# Patient Record
Sex: Female | Born: 1965
Health system: Southern US, Community
[De-identification: ages and names within clinical notes are randomized; demographics above are authoritative.]

## PROBLEM LIST (undated history)

## (undated) DIAGNOSIS — N189 Chronic kidney disease, unspecified: Secondary | ICD-10-CM

## (undated) DIAGNOSIS — E669 Obesity, unspecified: Secondary | ICD-10-CM

## (undated) DIAGNOSIS — K219 Gastro-esophageal reflux disease without esophagitis: Secondary | ICD-10-CM

## (undated) HISTORY — PX: CHOLECYSTECTOMY: SHX55

## (undated) HISTORY — PX: TUBAL LIGATION: SHX77

## (undated) HISTORY — DX: Chronic kidney disease, unspecified: N18.9

---

## 2000-06-13 ENCOUNTER — Encounter: Payer: Self-pay | Admitting: *Deleted

## 2000-06-13 ENCOUNTER — Inpatient Hospital Stay (HOSPITAL_COMMUNITY): Admission: AD | Admit: 2000-06-13 | Discharge: 2000-06-13 | Payer: Self-pay | Admitting: *Deleted

## 2000-06-14 ENCOUNTER — Inpatient Hospital Stay (HOSPITAL_COMMUNITY): Admission: AD | Admit: 2000-06-14 | Discharge: 2000-06-16 | Payer: Self-pay | Admitting: *Deleted

## 2000-06-18 ENCOUNTER — Encounter: Admission: RE | Admit: 2000-06-18 | Discharge: 2000-06-18 | Payer: Self-pay | Admitting: Obstetrics & Gynecology

## 2000-07-08 ENCOUNTER — Inpatient Hospital Stay (HOSPITAL_COMMUNITY): Admission: AD | Admit: 2000-07-08 | Discharge: 2000-07-08 | Payer: Self-pay | Admitting: *Deleted

## 2000-07-10 ENCOUNTER — Inpatient Hospital Stay (HOSPITAL_COMMUNITY): Admission: AD | Admit: 2000-07-10 | Discharge: 2000-07-10 | Payer: Self-pay | Admitting: Obstetrics

## 2000-07-10 ENCOUNTER — Encounter: Payer: Self-pay | Admitting: *Deleted

## 2000-07-14 ENCOUNTER — Encounter (HOSPITAL_COMMUNITY): Admission: RE | Admit: 2000-07-14 | Discharge: 2000-08-11 | Payer: Self-pay | Admitting: Obstetrics & Gynecology

## 2000-07-21 ENCOUNTER — Encounter: Payer: Self-pay | Admitting: Obstetrics

## 2000-08-08 ENCOUNTER — Encounter: Payer: Self-pay | Admitting: *Deleted

## 2000-08-10 ENCOUNTER — Inpatient Hospital Stay (HOSPITAL_COMMUNITY): Admission: AD | Admit: 2000-08-10 | Discharge: 2000-08-13 | Payer: Self-pay | Admitting: Obstetrics

## 2000-08-15 ENCOUNTER — Encounter (HOSPITAL_COMMUNITY): Admission: RE | Admit: 2000-08-15 | Discharge: 2000-08-19 | Payer: Self-pay | Admitting: *Deleted

## 2000-08-15 ENCOUNTER — Encounter: Payer: Self-pay | Admitting: *Deleted

## 2000-08-18 ENCOUNTER — Inpatient Hospital Stay (HOSPITAL_COMMUNITY): Admission: AD | Admit: 2000-08-18 | Discharge: 2000-08-20 | Payer: Self-pay | Admitting: Obstetrics & Gynecology

## 2000-08-18 ENCOUNTER — Encounter (INDEPENDENT_AMBULATORY_CARE_PROVIDER_SITE_OTHER): Payer: Self-pay | Admitting: Specialist

## 2002-10-30 ENCOUNTER — Encounter: Payer: Self-pay | Admitting: Emergency Medicine

## 2002-10-30 ENCOUNTER — Emergency Department (HOSPITAL_COMMUNITY): Admission: EM | Admit: 2002-10-30 | Discharge: 2002-10-30 | Payer: Self-pay | Admitting: Emergency Medicine

## 2006-11-18 ENCOUNTER — Emergency Department (HOSPITAL_COMMUNITY): Admission: EM | Admit: 2006-11-18 | Discharge: 2006-11-18 | Payer: Self-pay | Admitting: Emergency Medicine

## 2007-06-18 ENCOUNTER — Emergency Department (HOSPITAL_COMMUNITY): Admission: EM | Admit: 2007-06-18 | Discharge: 2007-06-18 | Payer: Self-pay | Admitting: Emergency Medicine

## 2008-10-04 ENCOUNTER — Inpatient Hospital Stay (HOSPITAL_COMMUNITY): Admission: EM | Admit: 2008-10-04 | Discharge: 2008-10-06 | Payer: Self-pay | Admitting: Emergency Medicine

## 2009-02-23 ENCOUNTER — Emergency Department (HOSPITAL_COMMUNITY): Admission: EM | Admit: 2009-02-23 | Discharge: 2009-02-24 | Payer: Self-pay | Admitting: Emergency Medicine

## 2009-06-14 ENCOUNTER — Observation Stay (HOSPITAL_COMMUNITY): Admission: EM | Admit: 2009-06-14 | Discharge: 2009-06-14 | Payer: Self-pay | Admitting: Emergency Medicine

## 2009-11-06 ENCOUNTER — Encounter: Admission: RE | Admit: 2009-11-06 | Discharge: 2009-11-06 | Payer: Self-pay | Admitting: Internal Medicine

## 2010-04-26 ENCOUNTER — Emergency Department (HOSPITAL_COMMUNITY): Payer: Self-pay

## 2010-04-26 ENCOUNTER — Emergency Department (HOSPITAL_COMMUNITY)
Admission: EM | Admit: 2010-04-26 | Discharge: 2010-04-27 | Disposition: A | Discharge status: Home / Self Care | Payer: Self-pay | Attending: Emergency Medicine | Admitting: Emergency Medicine

## 2010-04-26 DIAGNOSIS — E860 Dehydration: Secondary | ICD-10-CM | POA: Insufficient documentation

## 2010-04-26 DIAGNOSIS — R059 Cough, unspecified: Secondary | ICD-10-CM | POA: Insufficient documentation

## 2010-04-26 DIAGNOSIS — R5381 Other malaise: Secondary | ICD-10-CM | POA: Insufficient documentation

## 2010-04-26 DIAGNOSIS — R197 Diarrhea, unspecified: Secondary | ICD-10-CM | POA: Insufficient documentation

## 2010-04-26 DIAGNOSIS — R631 Polydipsia: Secondary | ICD-10-CM | POA: Insufficient documentation

## 2010-04-26 DIAGNOSIS — R0602 Shortness of breath: Secondary | ICD-10-CM | POA: Insufficient documentation

## 2010-04-26 DIAGNOSIS — R0609 Other forms of dyspnea: Secondary | ICD-10-CM | POA: Insufficient documentation

## 2010-04-26 DIAGNOSIS — J705 Respiratory conditions due to smoke inhalation: Secondary | ICD-10-CM | POA: Insufficient documentation

## 2010-04-26 DIAGNOSIS — Y929 Unspecified place or not applicable: Secondary | ICD-10-CM | POA: Insufficient documentation

## 2010-04-26 DIAGNOSIS — R062 Wheezing: Secondary | ICD-10-CM | POA: Insufficient documentation

## 2010-04-26 DIAGNOSIS — E119 Type 2 diabetes mellitus without complications: Secondary | ICD-10-CM | POA: Insufficient documentation

## 2010-04-26 DIAGNOSIS — R5383 Other fatigue: Secondary | ICD-10-CM | POA: Insufficient documentation

## 2010-04-26 DIAGNOSIS — Z79899 Other long term (current) drug therapy: Secondary | ICD-10-CM | POA: Insufficient documentation

## 2010-04-26 DIAGNOSIS — R1110 Vomiting, unspecified: Secondary | ICD-10-CM | POA: Insufficient documentation

## 2010-04-26 DIAGNOSIS — R42 Dizziness and giddiness: Secondary | ICD-10-CM | POA: Insufficient documentation

## 2010-04-26 DIAGNOSIS — X001XXA Exposure to smoke in uncontrolled fire in building or structure, initial encounter: Secondary | ICD-10-CM | POA: Insufficient documentation

## 2010-04-26 DIAGNOSIS — R0989 Other specified symptoms and signs involving the circulatory and respiratory systems: Secondary | ICD-10-CM | POA: Insufficient documentation

## 2010-04-26 DIAGNOSIS — R05 Cough: Secondary | ICD-10-CM | POA: Insufficient documentation

## 2010-04-27 LAB — DIFFERENTIAL
Basophils Absolute: 0.1 10*3/uL (ref 0.0–0.1)
Eosinophils Relative: 0 % (ref 0–5)
Lymphocytes Relative: 34 % (ref 12–46)
Lymphs Abs: 4.2 10*3/uL — ABNORMAL HIGH (ref 0.7–4.0)
Neutro Abs: 7.4 10*3/uL (ref 1.7–7.7)
Neutrophils Relative %: 59 % (ref 43–77)

## 2010-04-27 LAB — CBC
HCT: 41.4 % (ref 36.0–46.0)
MCV: 84.8 fL (ref 78.0–100.0)
RBC: 4.88 MIL/uL (ref 3.87–5.11)
RDW: 11.6 % (ref 11.5–15.5)
WBC: 12.5 10*3/uL — ABNORMAL HIGH (ref 4.0–10.5)

## 2010-04-27 LAB — COMPREHENSIVE METABOLIC PANEL
ALT: 13 U/L (ref 0–35)
Albumin: 4.3 g/dL (ref 3.5–5.2)
Alkaline Phosphatase: 71 U/L (ref 39–117)
BUN: 10 mg/dL (ref 6–23)
Chloride: 95 mEq/L — ABNORMAL LOW (ref 96–112)
Glucose, Bld: 437 mg/dL — ABNORMAL HIGH (ref 70–99)
Potassium: 3.2 mEq/L — ABNORMAL LOW (ref 3.5–5.1)
Sodium: 135 mEq/L (ref 135–145)
Total Bilirubin: 1.6 mg/dL — ABNORMAL HIGH (ref 0.3–1.2)
Total Protein: 8 g/dL (ref 6.0–8.3)

## 2010-04-27 LAB — URINALYSIS, ROUTINE W REFLEX MICROSCOPIC
Leukocytes, UA: NEGATIVE
Nitrite: NEGATIVE
Specific Gravity, Urine: 1.024 (ref 1.005–1.030)
Urine Glucose, Fasting: >1000 mg/dL — AB
pH: 5.5 (ref 5.0–8.0)

## 2010-04-27 LAB — URINE MICROSCOPIC-ADD ON

## 2010-04-27 LAB — CARBOXYHEMOGLOBIN: Total hemoglobin: 15 g/dL (ref 12.5–16.0)

## 2010-04-27 LAB — POCT PREGNANCY, URINE: Preg Test, Ur: NEGATIVE

## 2010-05-15 LAB — COMPREHENSIVE METABOLIC PANEL
Albumin: 4 g/dL (ref 3.5–5.2)
BUN: 5 mg/dL — ABNORMAL LOW (ref 6–23)
Calcium: 9.4 mg/dL (ref 8.4–10.5)
Creatinine, Ser: 0.54 mg/dL (ref 0.4–1.2)
Total Bilirubin: 0.7 mg/dL (ref 0.3–1.2)
Total Protein: 8 g/dL (ref 6.0–8.3)

## 2010-05-15 LAB — URINE MICROSCOPIC-ADD ON

## 2010-05-15 LAB — URINALYSIS, ROUTINE W REFLEX MICROSCOPIC
Hgb urine dipstick: NEGATIVE
Nitrite: NEGATIVE
Specific Gravity, Urine: 1.02 (ref 1.005–1.030)
Urobilinogen, UA: 0.2 mg/dL (ref 0.0–1.0)
pH: 6 (ref 5.0–8.0)

## 2010-05-15 LAB — CBC
HCT: 40.7 % (ref 36.0–46.0)
MCHC: 33.9 g/dL (ref 30.0–36.0)
MCV: 93.3 fL (ref 78.0–100.0)
Platelets: 291 10*3/uL (ref 150–400)
RDW: 12.4 % (ref 11.5–15.5)

## 2010-05-15 LAB — GLUCOSE, CAPILLARY
Glucose-Capillary: 289 mg/dL — ABNORMAL HIGH (ref 70–99)
Glucose-Capillary: 341 mg/dL — ABNORMAL HIGH (ref 70–99)

## 2010-05-15 LAB — DIFFERENTIAL
Basophils Absolute: 0 10*3/uL (ref 0.0–0.1)
Lymphocytes Relative: 35 % (ref 12–46)
Lymphs Abs: 2.3 10*3/uL (ref 0.7–4.0)
Monocytes Absolute: 0.4 10*3/uL (ref 0.1–1.0)
Monocytes Relative: 7 % (ref 3–12)
Neutro Abs: 3.7 10*3/uL (ref 1.7–7.7)

## 2010-05-15 LAB — URINE CULTURE

## 2010-05-28 LAB — URINALYSIS, ROUTINE W REFLEX MICROSCOPIC
Bilirubin Urine: NEGATIVE
Ketones, ur: >80 mg/dL — AB
Nitrite: NEGATIVE
Protein, ur: NEGATIVE mg/dL
Urobilinogen, UA: 0.2 mg/dL (ref 0.0–1.0)

## 2010-05-28 LAB — CBC
HCT: 39.9 % (ref 36.0–46.0)
Hemoglobin: 13.5 g/dL (ref 12.0–15.0)
MCHC: 33.9 g/dL (ref 30.0–36.0)
RBC: 4.32 MIL/uL (ref 3.87–5.11)

## 2010-05-28 LAB — URINE CULTURE: Colony Count: >=100000

## 2010-05-28 LAB — URINE MICROSCOPIC-ADD ON

## 2010-05-28 LAB — COMPREHENSIVE METABOLIC PANEL
ALT: 15 U/L (ref 0–35)
Alkaline Phosphatase: 77 U/L (ref 39–117)
BUN: 4 mg/dL — ABNORMAL LOW (ref 6–23)
CO2: 22 mEq/L (ref 19–32)
GFR calc non Af Amer: >60 mL/min (ref >60)
Glucose, Bld: 299 mg/dL — ABNORMAL HIGH (ref 70–99)
Potassium: 4 mEq/L (ref 3.5–5.1)
Sodium: 136 mEq/L (ref 135–145)

## 2010-05-28 LAB — DIFFERENTIAL
Basophils Absolute: 0 10*3/uL (ref 0.0–0.1)
Basophils Relative: 0 % (ref 0–1)
Eosinophils Absolute: 0 10*3/uL (ref 0.0–0.7)
Neutro Abs: 5.8 10*3/uL (ref 1.7–7.7)
Neutrophils Relative %: 81 % — ABNORMAL HIGH (ref 43–77)

## 2010-05-28 LAB — LIPASE, BLOOD: Lipase: 13 U/L (ref 11–59)

## 2010-05-28 LAB — POCT PREGNANCY, URINE: Preg Test, Ur: NEGATIVE

## 2010-06-03 LAB — COMPREHENSIVE METABOLIC PANEL
ALT: 18 U/L (ref 0–35)
AST: 38 U/L — ABNORMAL HIGH (ref 0–37)
Albumin: 3.4 g/dL — ABNORMAL LOW (ref 3.5–5.2)
Alkaline Phosphatase: 55 U/L (ref 39–117)
Alkaline Phosphatase: 69 U/L (ref 39–117)
BUN: 5 mg/dL — ABNORMAL LOW (ref 6–23)
CO2: 22 mEq/L (ref 19–32)
CO2: 26 mEq/L (ref 19–32)
Calcium: 9.2 mg/dL (ref 8.4–10.5)
Chloride: 101 mEq/L (ref 96–112)
Chloride: 105 mEq/L (ref 96–112)
GFR calc Af Amer: >60 mL/min (ref >60)
GFR calc non Af Amer: >60 mL/min (ref >60)
GFR calc non Af Amer: >60 mL/min (ref >60)
Glucose, Bld: 223 mg/dL — ABNORMAL HIGH (ref 70–99)
Glucose, Bld: 331 mg/dL — ABNORMAL HIGH (ref 70–99)
Potassium: 3.6 mEq/L (ref 3.5–5.1)
Potassium: 4.7 mEq/L (ref 3.5–5.1)
Sodium: 135 mEq/L (ref 135–145)
Total Bilirubin: 0.9 mg/dL (ref 0.3–1.2)
Total Bilirubin: 1.4 mg/dL — ABNORMAL HIGH (ref 0.3–1.2)

## 2010-06-03 LAB — CBC
HCT: 41.9 % (ref 36.0–46.0)
Hemoglobin: 12.8 g/dL (ref 12.0–15.0)
Hemoglobin: 13.9 g/dL (ref 12.0–15.0)
MCHC: 33.5 g/dL (ref 30.0–36.0)
MCV: 94.9 fL (ref 78.0–100.0)
RBC: 4.41 MIL/uL (ref 3.87–5.11)
RBC: 4.46 MIL/uL (ref 3.87–5.11)
WBC: 6.4 10*3/uL (ref 4.0–10.5)
WBC: 7.4 10*3/uL (ref 4.0–10.5)

## 2010-06-03 LAB — DIFFERENTIAL
Basophils Absolute: 0.1 10*3/uL (ref 0.0–0.1)
Basophils Relative: 1 % (ref 0–1)
Eosinophils Absolute: 0 10*3/uL (ref 0.0–0.7)
Eosinophils Relative: 0 % (ref 0–5)
Lymphs Abs: 1.8 10*3/uL (ref 0.7–4.0)
Neutrophils Relative %: 67 % (ref 43–77)

## 2010-06-03 LAB — URINALYSIS, ROUTINE W REFLEX MICROSCOPIC
Glucose, UA: >1000 mg/dL — AB
Leukocytes, UA: NEGATIVE
Nitrite: NEGATIVE
Protein, ur: NEGATIVE mg/dL
Specific Gravity, Urine: 1.022 (ref 1.005–1.030)

## 2010-06-03 LAB — CARDIAC PANEL(CRET KIN+CKTOT+MB+TROPI)
CK, MB: 0.4 ng/mL (ref 0.3–4.0)
Total CK: 32 U/L (ref 7–177)
Troponin I: 0.03 ng/mL (ref 0.00–0.06)

## 2010-06-03 LAB — GLUCOSE, CAPILLARY
Glucose-Capillary: 182 mg/dL — ABNORMAL HIGH (ref 70–99)
Glucose-Capillary: 192 mg/dL — ABNORMAL HIGH (ref 70–99)
Glucose-Capillary: 210 mg/dL — ABNORMAL HIGH (ref 70–99)
Glucose-Capillary: 212 mg/dL — ABNORMAL HIGH (ref 70–99)
Glucose-Capillary: 277 mg/dL — ABNORMAL HIGH (ref 70–99)

## 2010-06-03 LAB — TROPONIN I: Troponin I: 0.04 ng/mL (ref 0.00–0.06)

## 2010-06-03 LAB — LIPID PANEL
Cholesterol: 175 mg/dL (ref 0–200)
LDL Cholesterol: 127 mg/dL — ABNORMAL HIGH (ref 0–99)
VLDL: 9 mg/dL (ref 0–40)

## 2010-06-03 LAB — CULTURE, BLOOD (ROUTINE X 2)

## 2010-06-03 LAB — URINE MICROSCOPIC-ADD ON

## 2010-06-03 LAB — CALCIUM: Calcium: 8.7 mg/dL (ref 8.4–10.5)

## 2010-06-03 LAB — CK TOTAL AND CKMB (NOT AT ARMC)
Relative Index: INVALID (ref 0.0–2.5)
Total CK: 39 U/L (ref 7–177)

## 2010-06-03 LAB — RAPID URINE DRUG SCREEN, HOSP PERFORMED
Amphetamines: NOT DETECTED
Barbiturates: NOT DETECTED
Benzodiazepines: NOT DETECTED

## 2010-06-03 LAB — HEMOGLOBIN A1C
Hgb A1c MFr Bld: 9.9 % — ABNORMAL HIGH (ref 4.6–6.1)
Mean Plasma Glucose: 237 mg/dL

## 2010-06-03 LAB — LIPASE, BLOOD: Lipase: 14 U/L (ref 11–59)

## 2010-06-03 LAB — BRAIN NATRIURETIC PEPTIDE: Pro B Natriuretic peptide (BNP): 167 pg/mL — ABNORMAL HIGH (ref 0.0–100.0)

## 2010-06-03 LAB — AMYLASE: Amylase: 81 U/L (ref 27–131)

## 2010-07-10 NOTE — H&P (Signed)
[name], [name]             ACCOUNT NO.:  [id number]   MEDICAL RECORD NO.:  [id number]          PATIENT TYPE:  EMS   LOCATION:  ED                           FACILITY:  [facility]   PHYSICIAN:  Barbette Merino, M.D.      DATE OF BIRTH:  January 16, 1966   DATE OF ADMISSION:  10/04/2008  DATE OF DISCHARGE:                              HISTORY & PHYSICAL   PRIMARY CARE PHYSICIAN:  The patient is unassigned.  She [facility] to the  [facility].   PRESENTING COMPLAINT:  Presenting complaint of intractable nausea and  vomiting for 3 days.   HISTORY OF PRESENT ILLNESS:  The patient is a 45 year old diabetic who  has been noncompliant with her medications.  She has not taken her  medicines apparently for a long time.  She goes to the Health  Department.  She started having intractable nausea and vomiting for 3  days now.  She has vomited up to 10 times a day associated with some  epigastric pain..  The patient has tried to eat at home but has been  unable to keep anything down.  She has been nonstop vomiting.  She  denied any dysuria.  Denied any diarrhea or constipation.  She denied  any vaginal discharge.  Denied any blood streaks and her vomitus is very  clear.  No identifying triggers and no relieving factors.   PAST MEDICAL HISTORY:  1. Significant for type 2 diabetes.  2. Gastritis.  3. Morbid obesity.   ALLERGIES:  She is allergic to PENICILLIN.   MEDICATIONS:  Currently on no medicine at all.   SOCIAL HISTORY:  The patient lives in [place].  Denied any tobacco,  alcohol or IV drug use.   FAMILY HISTORY:  Significant for coronary artery disease, diabetes and  hypertension.   REVIEW OF SYSTEMS:  Mainly weakness.  Otherwise 14-point review of  systems is negative except per HPI.   PHYSICAL EXAMINATION:  VITAL SIGNS:  Temperature 98, blood pressure  initially 172/108, currently 150/88, pulse 82, respiratory 20, oxygen  saturation 97% on room air.  GENERAL:  The patient is  acutely ill, in obvious distress, retching and  continuously vomiting in the emergency room.  She has vomited yellowish  material.  No blood.  She is morbidly obese.  HEENT:  PERRL, EOMI.  NECK:  Supple.  No JVD, no lymphadenopathy.  RESPIRATORY:  She has good air entry bilaterally.  No wheezes or rales.  CARDIOVASCULAR:  S1 and S2.  No murmur.  ABDOMEN:  Soft, full with some epigastric discomfort but no obvious  tenderness with positive bowel sounds.  EXTREMITIES:  No edema, cyanosis  or clubbing.   LABORATORY DATA:  White count is 6.4, hemoglobin 13.9, platelet count  315,000 with normal differentials.  Sodium 135, potassium 4.7, chloride  101, CO2 26, glucose 331, BUN 6, creatinine 0.58, total bilirubin 1.4,  alkaline phosphatase 69, AST 38, ALT 18, total protein 7.6, albumin 3.8  and calcium 9.2.  Pregnancy test is negative.  Urinalysis showed cloudy  urine with glucosuria more than 1000, ketones of 40.  Urine microscopy  showed  wbc's 0-2 and many bacteria.   ASSESSMENT:  Therefore, this a 45 year old female presenting with  intractable nausea and vomiting.  The patient also is a diabetic who has  not been on any medications; hence she has hyperglycemia.  Her CBGs are  currently down to 280.  The most likely differential in this case is  probably gastroparesis from her diabetes which is untreated.  The  patient could still have some pancreatitis or gastritis, especially with  a past history of gastritis.  We will also need to rule out drugs as a  cause since the patient is relatively young.  Will need to check urine  drug screen.  I will put her on PPI IV, n.p.o. and control her nausea  and vomiting symptomatically.  We will also try and look for the  pancreatitis and other conditions.  The patient has type 1 diabetes and  she is not in DKA.  1. Diabetes. Looks like the patient has type 2 diabetes rather than      type 1, and her sugars are elevated probably secondary to mild       dehydration as well as not taking medications.  I will put her on      some Lantus with sliding scale insulin.  Will later transition her      to oral hypoglycemics when she is ready for discharge.  2. Hypertension.  The patient had no prior history of hypertension but      blood pressure is elevated today.  Will monitor her blood pressure      closely in the hospital.  If it remains elevated, we will treat      accordingly.  3. Morbid obesity.  Will counsel the patient as much as possible.  4. Medication noncompliance.  The patient will receive proper      counseling prior to discharge on medication compliance.   Otherwise further treatment will depend on the patient's initial  response to these measures.      Barbette Merino, M.D.  Electronically Signed     LG/MEDQ  D:  10/04/2008  T:  10/04/2008  Job:  [id number]

## 2010-07-10 NOTE — Discharge Summary (Signed)
[name], [name]             ACCOUNT NO.:  [id number]   MEDICAL RECORD NO.:  [id number]          PATIENT TYPE:  INP   LOCATION:  [place]                         FACILITY:  [facility]   PHYSICIAN:  Sharlet Salina, M.D.   DATE OF BIRTH:  1966-02-05   DATE OF ADMISSION:  10/04/2008  DATE OF DISCHARGE:  10/06/2008                               DISCHARGE SUMMARY   DISCHARGE DIAGNOSES:  1. Nausea and vomiting, now resolved, possibly gastroenteritis versus      gastroparesis.  2. Uncontrolled diabetes, noncompliant with medication.  3. Hypertension.  4. Obesity.  5. Dyslipidemia.   DISCHARGE MEDICATIONS:  1. Glucophage 500 mg twice daily.  2. Lotensin 10 mg daily.  3. Aspirin 81 mg daily.  4. Sliding scale insulin.  5. Prilosec OTC.   FOLLOW-UP APPOINTMENTS:  The patient goes to the Health Department, and  to follow up.  The patient goes to Health Department and should follow  up within the week.   PROCEDURE:  None.   CONSULTANTS:  None.   HISTORY OF PRESENT ILLNESS:  The patient is a 45 year old African  American female with a history of diabetes that is noncompliant with her  medications.  She had not taken her medications for a long time.  She  came to the emergency room with intractable nausea and vomiting for 3  days.  She had vomited up to 10 times a day with some epigastric pain.  Please see admission note for remainder of history.   Past Medical History, Family History, Social History, Medications,  Allergies and Review of Systems per admission H&P.   PHYSICAL EXAMINATION ON DISCHARGE:  VITAL SIGNS:  Temperature 99.8,  pulse of 90, respirations 20, blood pressure 113/74, pulse ox 95% on  room air.  GENERAL:  The patient is laying in bed, does not seem to be in any acute  distress.  HEENT:  Head is normocephalic, atraumatic.  Pupils are reactive to  light.  Throat without erythema.  HEART:  Regular rate and rhythm.  LUNGS:  Clear bilaterally.  ABDOMEN:  Positive  bowel sounds.  EXTREMITIES:  No acute edema.   HOSPITAL COURSE:  1. Nausea, vomiting.  The patient was admitted with nausea, vomiting.      Unsure of the etiology.  This could just be gastroenteritis versus      gastroparesis.  Her nausea, vomiting has resolved and since she was      admitted, she has been taking p.o.'s okay.  Will discharge her from      the hospital.  She will follow up with her primary care.  If this      continues to happen, then she will need a gastric emptying study to      evaluate for possible gastroparesis.  2. Hypertension.  When the patient came to the hospital, her blood      pressure was elevated.  She was started on Lotensin and blood      pressure is now good.  3. Diabetes.  The patient's diabetes is poorly controlled.  Hemoglobin      A1c is elevated.  She was  placed on Lantus and sliding scale in the      hospital.  She states at home,  before she started taking her      medication, she was taking Glucophage.  Will put her back on      Glucophage along with sliding scale insulin and she will follow up      with her primary care physician for further management of her      diabetes .  4. Dyslipidemia.  The patient's LDL is 127.  Goal is less than 100.      Encouraged diet and exercise.  If continues to be elevated, she      will need a statin.  TSH 1.569, BNP 167, hemoglobin A1c 9.9.   DISCHARGE LABORATORIES:  Blood cultures negative x2.  Cardiac panel  negative.  Her cholesterol is elevated at total cholesterol 175,  triglycerides 44, HDL 39, LDL 127.          Sharlet Salina, M.D.  Electronically Signed     NJ/MEDQ  D:  10/06/2008  T:  10/06/2008  Job:  [id number]   cc:   Health Department

## 2010-07-13 NOTE — Op Note (Signed)
[facility]  Patient:    [name], [name]                      MRN: [id number] Proc. Date: 08/19/00 Adm. Date:  [id number] Disc. Date: [id number] Attending:  Edmonia Caprio Dictator:   Irena Reichmann, M.D.                           Operative Report  DATE OF BIRTH:                1965-05-24  PREOPERATIVE DIAGNOSIS:       Multiparity, desires permanent sterilization.  POSTOPERATIVE DIAGNOSIS:      Multiparity, desires permanent sterilization.  OPERATION:                    Postpartum tubal ligation, Pomeroy method.  SURGEON:                      Cranston Neighbor, M.D.  ASSISTANT:                    Irena Reichmann, M.D.  ANESTHESIA:                   Epidural with local.  COMPLICATIONS:                None.  ESTIMATED BLOOD LOSS:         Per anesthesia.  INDICATIONS:                  A 45 year old, gravida 4, para 4-0-0-4, status post NSVD, who desires permanent sterilization.  Risks and benefits of the procedure are discussed with the patient including risk of failure of 1 in 300 with increased risk of ectopic gestation when pregnancy occurs.  FINDINGS:                     Normal uterus, tubes, and ovaries.  DESCRIPTION OF PROCEDURE:     The patient was taken to the operating room where epidural was found to be adequate.  Small transverse infraumbilical skin incision was then made with the scalpel.  The incision was carried through to the underlying fascia until the peritoneum was identified and entered.  The peritoneum was then noted to be free of any adhesions and the incision was then extended with Metzenbaum scissors.  The patients right fallopian tube was then identified, brought through the incision, and grasped with a Babcock clamp.  The tube was then followed out to the fimbria.  Babcock clamp was then used to grasp the tube approximately 4 cm from the cornual region.  3 cm segment of this tube was ligated with  2-0 chromic suture and excised.  Good hemostasis was noted and the tube was returned to the abdomen.  The left fallopian tube was then ligated in a similar fashion and a 3 cm segment excised.  Excellent hemostasis was noted and the tube returned to the abdomen. The peritoneum was identified and closed in a pursestring method using 2-0 chromic suture.  The fascia was then closed in a single layer using 3-0 Vicryl.  The subcutaneous tissue was cleared with four interrupted Monocryl sutures.  The skin was closed in subcuticular fashion using 4-0 Vicryl suture. This patient tolerated the procedure well.  Sponge, needle, and instrument counts were correct x 2.  The patient was  taken to the recovery room in stable condition.  PATHOLOGY:                    Segments of right and left fallopian tubes. DD:  08/19/00 TD:  08/19/00 Job: [id number]

## 2010-07-13 NOTE — Discharge Summary (Signed)
[facility]  Patient:    [name], BACIGALUPO Visit Number: [id number] MRN: [id number]          Service Type: MED Location: [facility] Attending Physician:  Edmonia Caprio Dictated by:   Clenton Pare Jodi Mourning, M.D. Adm. Date:  08/10/00 Disc. Date: 08/13/00                             Discharge Summary  ADMITTING DIAGNOSES:          1. Thirty-nine weeks gestation.                               2. Diabetes.                               3. Induction of labor.  DISCHARGE DIAGNOSES:          1. Thirty-nine weeks gestation.                               2. Diabetes.                               3. Induction of labor.                               4. Failed induction.                               5. Discharge home in good condition,                                  undelivered.  REASON FOR ADMISSION:         This is a 45 year old, G4, P3, female who presents at [redacted] weeks gestation for induction of labor. The patient has insulin-dependent gestational diabetes mellitus. She was on an oral hypoglycemic agent prior to pregnancy.  PAST MEDICAL HISTORY:         Surgery: None. Illnesses: per history of present illness.  MEDICATIONS:                  Insulin, prenatal vitamins.  ALLERGIES:                    No known drug allergies.  OBSTETRICAL HISTORY:          1988, normal spontaneous vaginal delivery, uncomplicated. 1994, normal spontaneous vaginal delivery, uncomplicated. 1998, normal spontaneous vaginal delivery, uncomplicated.  GYNECOLOGICAL HISTORY:        Negative for sexually transmitted diseases.  PHYSICAL EXAMINATION:         Well-nourished, well-developed female in no acute distress. Temperature 97, pulse 109, respiratory rate 26, and blood pressure 118/77. HEENT exam was benign. Lungs were clear to auscultation bilaterally. Cardiovascular exam revealed regular rate and rhythm without murmurs, rubs or gallop. Abdomen gravid, nontender.  Cervix long, closed, vertex, -3 station.  IMPRESSION:                   1. Thirty-nine weeks gestation.  2. Insulin-dependent gestational diabetes                                  mellitus.                               3. Unfavorable cervix.  PLAN:                         Cytotec cervical ripening per protocol.  LABORATORY VALUES:            Hemoglobin 11.0, hematocrit 33.5, white blood cell count 8700, platelets 360,000. Hemoglobin A1c was 7.5.  HOSPITAL COURSE:              The patient was started on Cytotec cervical ripening per protocol, and Pitocin was started IV the following morning. The patient made no progress in labor and Pitocin was discontinued, and restarted for a second day, and the patient made no progress after two days of sequential cervical ripening and Pitocin IV for labor. On day #3 a decision was made to discontinue induction of labor because of the lack of progress. Fetal heart rate tracing was reassuring throughout and management was discussed with the patient, and she agreed to back off on induction of labor at this point, and to continue pregnancy for another week and be reevaluated at that time for induction of labor if she is not in labor spontaneously. The patient was discharged home in good condition, undelivered.  DISCHARGE DISPOSITION:  DISCHARGE MEDICATIONS:        Continue prenatal vitamins and insulin.  DISCHARGE INSTRUCTIONS:       Routine instructions were given for obstetric discharge, fetal movement.  DISCHARGE FOLLOWUP:           The patient will follow up on Friday, July 27, 2000 at the Maternity Admissions Unit for reevaluation. Dictated by:   Clenton Pare Jodi Mourning, M.D. Attending Physician:  Edmonia Caprio DD:  10/15/00 TD:  10/16/00 Job: [id number]

## 2010-07-13 NOTE — Discharge Summary (Signed)
[facility]  Patient:    [name], [name]                      MRN: [id number] Adm. Date:  [id number] Disc. Date: 06/16/00 Attending:  Renda Rolls Dictator:   Kelle Darting, M.D.                           Discharge Summary  Patient signed out against medical advice.  ADMISSION DIAGNOSES:          1. Intrauterine pregnancy with late prenatal                                  care.                               2. Diabetes mellitus type 2, insulin-requiring.                               3. Diabetic ketoacidosis.  DISCHARGE DIAGNOSES:          1. Intrauterine pregnancy with late prenatal                                  care.                               2. Diabetes mellitus type 2, insulin-requiring.                               3. Diabetic ketoacidosis.                               4. Noncompliance with medical recommendations,                                  the patient signed out against medical                                  advice.  DISCHARGE MEDICATIONS:        1. Insulin in the following doses:  NPH 20 units                                  subcutaneous in the morning and 20 units                                  subcutaneous in the evening before bedtime                               2. Humalog 27 units subcutaneous before  breakfast, 27 units subcutaneous before                                  lunch, 27 units subcutaneous before dinner.  INTERNS:                      Dr. Ernie Hew and Dr. Anitra Lauth.  CONSULTATIONS:                None.  PROCEDURES:                   Complete obstetric ultrasound.  Findings were a single fetus in breech presentation with anterior grade I placenta.  Amniotic fluid was within normal limits.  Estimated gestational age was 14 weeks and 3 days.  HISTORY OF PRESENT ILLNESS:   For complete H&P, see resident H&P in chart. Briefly, this is a 45 year old G4,  P3-0-0-3, with known type 2 diabetes, on oral hypoglycemic drugs.  She presented complaining of nausea and vomiting for five days.  She reported hyperglycemia (greater than 200) over the last few weeks and had also presented to maternal admissions the day before this presentation with similar complaints but left secondary to child-care issues.  Physical exam revealed mild stress and some lower extremity edema and tachycardia.  Otherwise was within normal limits.  Vital signs were stable. Laboratories revealed a bicarbonate of 10, glucose of 422.  She was admitted to the AICU.  HOSPITAL COURSE:              The patient was admitted and placed on IV fluids and insulin drip.  She began to feel better soon and was requesting to go home so that she could take care of her children.  The patients CBGs quickly were brought into the normal range, and her anion gap normalized.  Insulin drip was discontinued.  Bicarbonate was 13 at that time.  It was determined that the patient still needed IV fluids and insulin drip to further clear her acidosis. However, she was adamant on leaving the hospital in order to take care of her children.  After a lengthy discussion of risks to her and her fetus, she demonstrated understanding and still wanted to leave against our medical advice.  She signed out against medical advice on June 16, 2000.  She was arranged to have home care visits three times weekly to help with checking her sugars and administering her insulin, and she was set up to be followed by the high-risk OB clinic. DD:  08/10/00 TD:  08/10/00 Job: [id number]

## 2010-11-20 LAB — COMPREHENSIVE METABOLIC PANEL
ALT: 15
AST: 22
CO2: 26
Calcium: 9.1
GFR calc Af Amer: >60
Sodium: 138
Total Protein: 7.7

## 2010-11-20 LAB — DIFFERENTIAL
Eosinophils Absolute: 0
Eosinophils Relative: 0
Lymphs Abs: 1.8
Monocytes Relative: 5

## 2010-11-20 LAB — URINALYSIS, ROUTINE W REFLEX MICROSCOPIC
Bilirubin Urine: NEGATIVE
Glucose, UA: >1000 — AB
Ketones, ur: 15 — AB
Leukocytes, UA: NEGATIVE
pH: 7.5

## 2010-11-20 LAB — URINE MICROSCOPIC-ADD ON

## 2010-11-20 LAB — CBC
MCHC: 34.4
RBC: 4.32
RDW: 13.3

## 2010-12-06 LAB — CBC
Hemoglobin: 12.7
MCHC: 33.5
MCV: 91.1
RBC: 4.15
WBC: 8.6

## 2010-12-06 LAB — URINALYSIS, ROUTINE W REFLEX MICROSCOPIC
Bilirubin Urine: NEGATIVE
Glucose, UA: 250 — AB
Hgb urine dipstick: NEGATIVE
Ketones, ur: >80 — AB
Protein, ur: 100 — AB
Urobilinogen, UA: 1

## 2010-12-06 LAB — URINE MICROSCOPIC-ADD ON

## 2010-12-06 LAB — GC/CHLAMYDIA PROBE AMP, GENITAL
Chlamydia, DNA Probe: NEGATIVE
GC Probe Amp, Genital: NEGATIVE

## 2010-12-06 LAB — DIFFERENTIAL
Basophils Absolute: 0
Basophils Relative: 0
Eosinophils Absolute: 0
Eosinophils Relative: 0
Lymphs Abs: 1.4
Neutrophils Relative %: 78 — ABNORMAL HIGH

## 2010-12-06 LAB — PREGNANCY, URINE: Preg Test, Ur: NEGATIVE

## 2010-12-06 LAB — COMPREHENSIVE METABOLIC PANEL
ALT: 12
AST: 16
CO2: 25
Calcium: 9.5
Chloride: 101
GFR calc non Af Amer: >60
Glucose, Bld: 211 — ABNORMAL HIGH
Sodium: 138
Total Bilirubin: 0.7

## 2010-12-06 LAB — WET PREP, GENITAL
Clue Cells Wet Prep HPF POC: NONE SEEN
WBC, Wet Prep HPF POC: NONE SEEN
Yeast Wet Prep HPF POC: NONE SEEN

## 2011-04-26 ENCOUNTER — Encounter (HOSPITAL_COMMUNITY): Payer: Self-pay | Admitting: Emergency Medicine

## 2011-04-26 ENCOUNTER — Other Ambulatory Visit: Payer: Self-pay

## 2011-04-26 ENCOUNTER — Emergency Department (HOSPITAL_COMMUNITY): Payer: Self-pay

## 2011-04-26 ENCOUNTER — Emergency Department (HOSPITAL_COMMUNITY)
Admission: EM | Admit: 2011-04-26 | Discharge: 2011-04-26 | Disposition: A | Discharge status: Home / Self Care | Payer: Self-pay | Attending: Emergency Medicine | Admitting: Emergency Medicine

## 2011-04-26 DIAGNOSIS — R42 Dizziness and giddiness: Secondary | ICD-10-CM | POA: Insufficient documentation

## 2011-04-26 DIAGNOSIS — R55 Syncope and collapse: Secondary | ICD-10-CM | POA: Insufficient documentation

## 2011-04-26 DIAGNOSIS — R1110 Vomiting, unspecified: Secondary | ICD-10-CM | POA: Insufficient documentation

## 2011-04-26 DIAGNOSIS — E119 Type 2 diabetes mellitus without complications: Secondary | ICD-10-CM | POA: Insufficient documentation

## 2011-04-26 DIAGNOSIS — R109 Unspecified abdominal pain: Secondary | ICD-10-CM | POA: Insufficient documentation

## 2011-04-26 DIAGNOSIS — R000 Tachycardia, unspecified: Secondary | ICD-10-CM | POA: Insufficient documentation

## 2011-04-26 DIAGNOSIS — R739 Hyperglycemia, unspecified: Secondary | ICD-10-CM

## 2011-04-26 LAB — BASIC METABOLIC PANEL
CO2: 19 mEq/L (ref 19–32)
Calcium: 9.4 mg/dL (ref 8.4–10.5)
Creatinine, Ser: 0.55 mg/dL (ref 0.50–1.10)
Glucose, Bld: 296 mg/dL — ABNORMAL HIGH (ref 70–99)
Sodium: 132 mEq/L — ABNORMAL LOW (ref 135–145)

## 2011-04-26 LAB — DIFFERENTIAL
Basophils Absolute: 0 10*3/uL (ref 0.0–0.1)
Basophils Relative: 0 % (ref 0–1)
Lymphocytes Relative: 31 % (ref 12–46)
Neutro Abs: 7.2 10*3/uL (ref 1.7–7.7)
Neutrophils Relative %: 63 % (ref 43–77)

## 2011-04-26 LAB — COMPREHENSIVE METABOLIC PANEL
ALT: 11 U/L (ref 0–35)
Alkaline Phosphatase: 103 U/L (ref 39–117)
BUN: 12 mg/dL (ref 6–23)
CO2: 19 mEq/L (ref 19–32)
Calcium: 9.7 mg/dL (ref 8.4–10.5)
GFR calc Af Amer: >90 mL/min (ref >90)
GFR calc non Af Amer: >90 mL/min (ref >90)
Glucose, Bld: 345 mg/dL — ABNORMAL HIGH (ref 70–99)
Sodium: 129 mEq/L — ABNORMAL LOW (ref 135–145)
Total Protein: 8.7 g/dL — ABNORMAL HIGH (ref 6.0–8.3)

## 2011-04-26 LAB — URINALYSIS, ROUTINE W REFLEX MICROSCOPIC
Leukocytes, UA: NEGATIVE
Nitrite: POSITIVE — AB
Specific Gravity, Urine: 1.027 (ref 1.005–1.030)
Urobilinogen, UA: 0.2 mg/dL (ref 0.0–1.0)
pH: 5.5 (ref 5.0–8.0)

## 2011-04-26 LAB — CBC
Hemoglobin: 14.4 g/dL (ref 12.0–15.0)
MCHC: 34.5 g/dL (ref 30.0–36.0)
RDW: 12.4 % (ref 11.5–15.5)
WBC: 11.4 10*3/uL — ABNORMAL HIGH (ref 4.0–10.5)

## 2011-04-26 LAB — TROPONIN I: Troponin I: <0.3 ng/mL (ref <0.30)

## 2011-04-26 LAB — URINE MICROSCOPIC-ADD ON

## 2011-04-26 MED ORDER — INSULIN ASPART 100 UNIT/ML ~~LOC~~ SOLN
8.0000 [IU] | Freq: Once | SUBCUTANEOUS | Status: AC
  Administered 2011-04-26: 8 [IU] via INTRAVENOUS
  Filled 2011-04-26: qty 1

## 2011-04-26 MED ORDER — METFORMIN HCL 500 MG PO TABS: 500.0000 mg | ORAL_TABLET | Freq: Two times a day (BID) | ORAL | Status: DC

## 2011-04-26 MED ORDER — PANTOPRAZOLE SODIUM 40 MG IV SOLR
40.0000 mg | Freq: Once | INTRAVENOUS | Status: AC
  Administered 2011-04-26: 40 mg via INTRAVENOUS
  Filled 2011-04-26: qty 40

## 2011-04-26 MED ORDER — METOCLOPRAMIDE HCL 10 MG PO TABS: 10.0000 mg | ORAL_TABLET | Freq: Four times a day (QID) | ORAL | Status: DC | PRN

## 2011-04-26 MED ORDER — INSULIN REGULAR HUMAN 100 UNIT/ML IJ SOLN: 8.0000 [IU] | Freq: Once | INTRAMUSCULAR | Status: DC

## 2011-04-26 MED ORDER — ONDANSETRON 8 MG PO TBDP: ORAL_TABLET | ORAL | Status: AC

## 2011-04-26 MED ORDER — ONDANSETRON HCL 4 MG/2ML IJ SOLN
4.0000 mg | Freq: Once | INTRAMUSCULAR | Status: AC
  Administered 2011-04-26: 4 mg via INTRAVENOUS
  Filled 2011-04-26: qty 2

## 2011-04-26 MED ORDER — SODIUM CHLORIDE 0.9 % IV SOLN
INTRAVENOUS | Status: DC
  Administered 2011-04-26: 13:00:00 via INTRAVENOUS

## 2011-04-26 MED ORDER — FAMOTIDINE IN NACL 20-0.9 MG/50ML-% IV SOLN
20.0000 mg | Freq: Once | INTRAVENOUS | Status: AC
  Administered 2011-04-26: 20 mg via INTRAVENOUS
  Filled 2011-04-26: qty 50

## 2011-04-26 MED ORDER — SODIUM CHLORIDE 0.9 % IV BOLUS (SEPSIS)
1000.0000 mL | Freq: Once | INTRAVENOUS | Status: AC
  Administered 2011-04-26: 1000 mL via INTRAVENOUS

## 2011-04-26 MED ORDER — GI COCKTAIL ~~LOC~~
30.0000 mL | Freq: Once | ORAL | Status: AC
  Administered 2011-04-26: 30 mL via ORAL
  Filled 2011-04-26: qty 30

## 2011-04-26 MED ORDER — SODIUM CHLORIDE 0.9 % IV BOLUS (SEPSIS)
2000.0000 mL | Freq: Once | INTRAVENOUS | Status: AC
  Administered 2011-04-26: 2000 mL via INTRAVENOUS

## 2011-04-26 MED ORDER — ONDANSETRON HCL 4 MG/2ML IJ SOLN
4.0000 mg | Freq: Once | INTRAMUSCULAR | Status: AC
Start: 2011-04-26 — End: 2011-04-26
  Administered 2011-04-26: 4 mg via INTRAVENOUS
  Filled 2011-04-26: qty 2

## 2011-04-26 NOTE — ED Notes (Signed)
The patient is sleeping at this time w/no acute distress noted.  Resting comfortably

## 2011-04-26 NOTE — ED Provider Notes (Signed)
History     CSN: [id number]  Arrival date & time 04/26/11  1142   First MD Initiated Contact with Patient 04/26/11 1213      Chief Complaint  Patient presents with  . Hyperglycemia  . Abdominal Pain    No vomiting, just started her period yesterday    (Consider location/radiation/quality/duration/timing/severity/associated sxs/prior treatment) HPI  PCP none Meds none noncompliant This 46 year old female has a history of diabetes and noncompliance she does not take her metformin she does not have a doctor. She states over the last week she has had constant 24-hour a day diffuse abdominal pain associated with multiple episodes daily of nonbloody vomiting with nausea. She is no diarrhea. She has no dysuria. She denies polyuria. She denies cough chest pain or shortness of breath. She has been feeling generally weaker and weaker and felt lightheaded this morning he was sitting on her bed when she felt like she was going to pass out and woke up lying on her bed after having an apparent brief syncopal spell without trauma. She is no headache. She is no change in speech vision swallowing or understanding. She is no lateralizing or focal weakness or numbness or incoordination. She does feel generally weak and dehydrated from the abdominal pain and vomiting she's been doing for last week nonstop. Past Medical History  Diagnosis Date  . Diabetes mellitus     Past Surgical History  Procedure Date  . Cholecystectomy     No family history on file.  History  Substance Use Topics  . Smoking status: Not on file  . Smokeless tobacco: Not on file  . Alcohol Use: No    OB History    Grav Para Term Preterm Abortions TAB SAB Ect Mult Living                  Review of Systems  Constitutional: Negative for fever.       10 Systems reviewed and are negative for acute change except as noted in the HPI.  HENT: Negative for congestion.   Eyes: Negative for discharge and redness.  Respiratory:  Negative for cough and shortness of breath.   Cardiovascular: Negative for chest pain.  Gastrointestinal: Positive for nausea, vomiting and abdominal pain. Negative for diarrhea and blood in stool.  Genitourinary: Negative for dysuria.  Musculoskeletal: Negative for back pain.  Skin: Negative for rash.  Neurological: Positive for syncope, weakness and light-headedness. Negative for numbness and headaches.  Psychiatric/Behavioral:       No behavior change.    Allergies  Penicillins  Home Medications   Current Outpatient Rx  Name Route Sig Dispense Refill  . CALCIUM CARBONATE ANTACID 500 MG PO CHEW Oral Chew 2 tablets by mouth daily.    Marland Kitchen METFORMIN HCL 500 MG PO TABS Oral Take 1 tablet (500 mg total) by mouth 2 (two) times daily with a meal. 60 tablet 0  . METOCLOPRAMIDE HCL 10 MG PO TABS Oral Take 1 tablet (10 mg total) by mouth every 6 (six) hours as needed (nausea/headache). 6 tablet 0  . ONDANSETRON 8 MG PO TBDP  8mg  ODT q4 hours prn nausea 4 tablet 0    BP 146/86  Pulse 121  Temp(Src) 99.1 F (37.3 C) (Oral)  Resp 19  SpO2 99%  LMP 04/26/2011  Physical Exam  Nursing note and vitals reviewed. Constitutional:       Awake, alert, nontoxic appearance.  HENT:  Head: Atraumatic.  Eyes: Right eye exhibits no discharge. Left eye exhibits  no discharge.  Neck: Neck supple.  Cardiovascular: Regular rhythm.   No murmur heard.      tachycardic  Pulmonary/Chest: Effort normal and breath sounds normal. No respiratory distress. She has no wheezes. She has no rales. She exhibits no tenderness.  Abdominal: Soft. Bowel sounds are normal. She exhibits no mass. There is tenderness. There is no rebound and no guarding.       Mild diffuse tenderness  Musculoskeletal: She exhibits no tenderness.       Baseline ROM, no obvious new focal weakness.  Neurological: She is alert.       Mental status and motor strength appears baseline for patient and situation.  Skin: No rash noted.    Psychiatric: She has a normal mood and affect.    ED Course  Procedures (including critical care time) ECG: Sinus tachycardia, ventricular rate 117, normal axis, normal intervals, no acute ischemic changes noted, no significant change compared with Dec2010  The patient feels much improved and refused imaging. repeat labs are still pending she has had no vomiting in the emergency department. [place], CAPILLARY - Abnormal; Notable for the following:    Glucose-Capillary 359 (*)    All other components within normal limits  COMPREHENSIVE METABOLIC PANEL - Abnormal; Notable for the following:    Sodium 129 (*)    Chloride 92 (*)    Glucose, Bld 345 (*)    Total Protein 8.7 (*)    All other components within normal limits  CBC - Abnormal; Notable for the following:    WBC 11.4 (*)    All other components within normal limits  URINALYSIS, ROUTINE W REFLEX MICROSCOPIC - Abnormal; Notable for the following:    Glucose, UA >1000 (*)    Hgb urine dipstick LARGE (*)    Ketones, ur >80 (*)    Nitrite POSITIVE (*)    All other components within normal limits  BLOOD GAS, VENOUS - Abnormal; Notable for the following:    pH, Ven 7.356 (*)    pCO2, Ven 36.3 (*)    pO2, Ven 51.6 (*)    Bicarbonate 19.8 (*)    Acid-base deficit 4.6 (*)    All other components within normal limits  URINE MICROSCOPIC-ADD ON - Abnormal; Notable for the following:    Bacteria, UA FEW (*)    All other components within normal limits  GLUCOSE, CAPILLARY - Abnormal; Notable for the following:    Glucose-Capillary 380 (*)    All other components within normal limits  BASIC METABOLIC PANEL - Abnormal; Notable for the following:    Sodium 132 (*)    Glucose, Bld 296 (*)    All other components within normal limits  GLUCOSE, CAPILLARY - Abnormal; Notable for the following:    Glucose-Capillary 267 (*)    All other components within normal limits  LIPASE, BLOOD  DIFFERENTIAL  TROPONIN I  POCT  PREGNANCY, URINE   No results found.   1. Vomiting   2. Diabetes mellitus   3. Hyperglycemia       MDM  Signed out to Dr. Edison Pace repeat BMP pnd; if AG improved anticipate discharge and not DKA requiring admit.        Babette Relic, MD 04/28/11 [phone]

## 2011-04-26 NOTE — ED Notes (Signed)
Pt family called 65 for pt "passed out" but not witnessed by EMS or fire.  Her BS was 411 she C/O abd pain X 1 week and has not seen anyone for this.  We are doing an EKG because her heart rate is 123.  She has not had a BM in 2 weeks and has not taken anything for it

## 2011-04-26 NOTE — Discharge Instructions (Signed)
Blood Sugar Monitoring, Adult GLUCOSE METERS FOR SELF-MONITORING OF BLOOD GLUCOSE  It is important to be able to correctly measure your blood sugar (glucose). You can use a blood glucose monitor (a small battery-operated device) to check your glucose level at any time. This allows you and your caregiver to monitor your diabetes and to determine how well your treatment plan is working. The process of monitoring your blood glucose with a glucose meter is called self-monitoring of blood glucose (SMBG). When people with diabetes control their blood sugar, they have better health. To test for glucose with a typical glucose meter, place the disposable strip in the meter. Then place a small sample of blood on the "test strip." The test strip is coated with chemicals that combine with glucose in blood. The meter measures how much glucose is present. The meter displays the glucose level as a number. Several new models can record and store a number of test results. Some models can connect to personal computers to store test results or print them out.  Newer meters are often easier to use than older models. Some meters allow you to get blood from places other than your fingertip. Some new models have automatic timing, error codes, signals, or barcode readers to help with proper adjustment (calibration). Some meters have a large display screen or spoken instructions for people with visual impairments.  INSTRUCTIONS FOR USING GLUCOSE METERS  Wash your hands with soap and warm water, or clean the area with alcohol. Dry your hands completely.   Prick the side of your fingertip with a lancet (a sharp-pointed tool used by hand).   Hold the hand down and gently milk the finger until a small drop of blood appears. Catch the blood with the test strip.   Follow the instructions for inserting the test strip and using the SMBG meter. Most meters require the meter to be turned on and the test strip to be inserted before  applying the blood sample.   Record the test result.   Read the instructions carefully for both the meter and the test strips that go with it. Meter instructions are found in the user manual. Keep this manual to help you solve any problems that may arise. Many meters use "error codes" when there is a problem with the meter, the test strip, or the blood sample on the strip. You will need the manual to understand these error codes and fix the problem.   New devices are available such as laser lancets and meters that can test blood taken from "alternative sites" of the body, other than fingertips. However, you should use standard fingertip testing if your glucose changes rapidly. Also, use standard testing if:   You have eaten, exercised, or taken insulin in the past 2 hours.   You think your glucose is low.   You tend to not feel symptoms of low blood glucose (hypoglycemia).   You are ill or under stress.   Clean the meter as directed by the manufacturer.   Test the meter for accuracy as directed by the manufacturer.   Take your meter with you to your caregiver's office. This way, you can test your glucose in front of your caregiver to make sure you are using the meter correctly. Your caregiver can also take a sample of blood to test using a routine lab method. If values on the glucose meter are close to the lab results, you and your caregiver will see that your meter is working well  and you are using good technique. Your caregiver will advise you about what to do if the results do not match.  FREQUENCY OF TESTING  Your caregiver will tell you how often you should check your blood glucose. This will depend on your type of diabetes, your current level of diabetes control, and your types of medicines. The following are general guidelines, but your care plan may be different. Record all your readings and the time of day you took them for review with your caregiver.   Diabetes type 1.   When you  are using insulin with good diabetic control (either multiple daily injections or via a pump), you should check your glucose 4 times a day.   If your diabetes is not well controlled, you may need to monitor more frequently, including before meals and 2 hours after meals, at bedtime, and occasionally between 2 a.m. and 3 a.m.   You should always check your glucose before a dose of insulin or before changing the rate on your insulin pump.   Diabetes type 2.   Guidelines for SMBG in diabetes type 2 are not as well defined.   If you are on insulin, follow the guidelines above.   If you are on medicines, but not insulin, and your glucose is not well controlled, you should test at least twice daily.   If you are not on insulin, and your diabetes is controlled with medicines or diet alone, you should test at least once daily, usually before breakfast.   A weekly profile will help your caregiver advise you on your care plan. The week before your visit, check your glucose before a meal and 2 hours after a meal at least daily. You may want to test before and after a different meal each day so you and your caregiver can tell how well controlled your blood sugars are throughout the course of a 24 hour period.   Gestational diabetes (diabetes during pregnancy).   Frequent testing is often necessary. Accurate timing is important.   If you are not on insulin, check your glucose 4 times a day. Check it before breakfast and 1 hour after the start of each meal.   If you are on insulin, check your glucose 6 times a day. Check it before each meal and 1 hour after the first bite of each meal.   General guidelines.   More frequent testing is required at the start of insulin treatment. Your caregiver will instruct you.   Test your glucose any time you suspect you have low blood sugar (hypoglycemia).   You should test more often when you change medicines, when you have unusual stress or illness, or in other  unusual circumstances.  OTHER THINGS TO KNOW ABOUT GLUCOSE METERS  Measurement Range. Most glucose meters are able to read glucose levels over a broad range of values from as low as 0 to as high as 600 mg/dL. If you get an extremely high or low reading from your meter, you should first confirm it with another reading. Report very high or very low readings to your caregiver.   Whole Blood Glucose versus Plasma Glucose. Some older home glucose meters measure glucose in your whole blood. In a lab or when using some newer home glucose meters, the glucose is measured in your plasma (one component of blood). The difference can be important. It is important for you and your caregiver to know whether your meter gives its results as "whole blood equivalent" or "plasma  equivalent."   Display of High and Low Glucose Values. Part of learning how to operate a meter is understanding what the meter results mean. Know how high and low glucose concentrations are displayed on your meter.   Factors that Affect Glucose Meter Performance. The accuracy of your test results depends on many factors and varies depending on the brand and type of meter. These factors include:   Low red blood cell count (anemia).   Substances in your blood (such as uric acid, vitamin C, and others).   Environmental factors (temperature, humidity, altitude).   Name-brand versus generic test strips.   Calibration. Make sure your meter is set up properly. It is a good idea to do a calibration test with a control solution recommended by the manufacturer of your meter whenever you begin using a fresh bottle of test strips. This will help verify the accuracy of your meter.   Improperly stored, expired, or defective test strips. Keep your strips in a dry place with the lid on.   Soiled meter.   Inadequate blood sample.  NEW TECHNOLOGIES FOR GLUCOSE TESTING Alternative site testing Some glucose meters allow testing blood from alternative  sites. These include the:  Upper arm.   Forearm.   Base of the thumb.   Thigh.  Sampling blood from alternative sites may be desirable. However, it may have some limitations. Blood in the fingertips show changes in glucose levels more quickly than blood in other parts of the body. This means that alternative site test results may be different from fingertip test results, not because of the meter's ability to test accurately, but because the actual glucose concentration can be different.  Continuous Glucose Monitoring Devices to measure your blood glucose continuously are available, and others are in development. These methods can be more expensive than self-monitoring with a glucose meter. However, it is uncertain how effective and reliable these devices are. Your caregiver will advise you if this approach makes sense for you. IF BLOOD SUGARS ARE CONTROLLED, PEOPLE WITH DIABETES REMAIN HEALTHIER.  SMBG is an important part of the treatment plan of patients with diabetes mellitus. Below are reasons for using SMBG:   It confirms that your glucose is at a specific, healthy level.   It detects hypoglycemia and severe hyperglycemia.   It allows you and your caregiver to make adjustments in response to changes in lifestyle for individuals requiring medicine.   It determines the need for starting insulin therapy in temporary diabetes that happens during pregnancy (gestational diabetes).  Document Released: 02/14/2003 Document Revised: 10/25/2010 Document Reviewed: 06/07/2010 [facility] Patient Information 2012 [place].  Abdominal (belly) pain can be caused by many things. Your caregiver performed an examination and possibly ordered blood/urine tests and imaging (CT scan, x-rays, ultrasound). Many cases can be observed and treated at home after initial evaluation in the emergency department. Even though you are being discharged home, abdominal pain can be unpredictable. Therefore, you need a  repeated exam if your pain does not resolve, returns, or worsens. Most patients with abdominal pain don't have to be admitted to the hospital or have surgery, but serious problems like appendicitis and gallbladder attacks can start out as nonspecific pain. Many abdominal conditions cannot be diagnosed in one visit, so follow-up evaluations are very important. SEEK IMMEDIATE MEDICAL ATTENTION IF: The pain does not go away or becomes severe.  A temperature above 101 develops.  Repeated vomiting occurs (multiple episodes).  The pain becomes localized to portions of the abdomen. The  right side could possibly be appendicitis. In an adult, the left lower portion of the abdomen could be colitis or diverticulitis.  Blood is being passed in stools or vomit (bright red or black tarry stools).  Return also if you develop chest pain, difficulty breathing, dizziness or fainting, or become confused, poorly responsive, or inconsolable (young children).  Your caregiver has seen you today because you are having problems with feelings of weakness, dizziness, and/or fatigue. Weakness has many different causes, some of which are common and others are very rare. Your caregiver has considered some of the most common causes of weakness and feels it is safe for you to go home and be observed. Not every illness or injury can be identified during an emergency department visit, thus follow-up with your primary healthcare provider is important. Medical conditions can also worsen, so it is also important to return immediately as directed below, or if you have other serious concerns develop. RETURN IMMEDIATELY IF you develop new shortness of breath, chest pain, fever, have difficulty moving parts of your body (new weakness, numbness, or incoordination), sudden change in speech, vision, swallowing, or understanding, faint or develop new dizziness, severe headache, become poorly responsive or have an altered mental status compared to  baseline for you, new rash, abdominal pain, or bloody stools,  Return sooner also if you develop new problems for which you have not talked to your caregiver but you feel may be emergency medical conditions, or are unable to be cared for safely at home.  RESOURCE GUIDE  Dental Problems  Patients with Medicaid: [place] Phone:  [phone]                                                  Phone:  [phone]  If unable to pay or uninsured, contact:  [facility] or [facility]. to become qualified for the adult dental clinic.  Chronic Pain Problems Contact [facility]  [phone] Patients need to be referred by their primary care doctor.  Insufficient Money for Medicine Contact [employer]:  call "211" or [place] [phone].  No Primary Care Doctor Call [facility]  [phone] Other agencies that provide inexpensive medical care    [place]  [phone]    [facility]  [place]  [phone]    [facility]  [phone]    [facility]  [place]  [place]  [phone] [place]   [phone] (emergency services [phone])  [facility]  [phone] [facility] [phone] [place] [phone] [place] Pester [phone] Residential & Outpatient Substance Abuse Program  [phone]  Abuse/Neglect [place] [phone] [place] [phone] (After Hours)  Emergency [place]  [employer] [phone]  [place] [facility] [place] [phone] [place] [phone]  MRSA Hotline #:   475-152-3805    [facility] Dept. [address]      [place] Phone:  [phone]                                   Phone:  [phone]                 Phone:  [place] Phone:  [employer] [phone] (After Hours)

## 2011-04-26 NOTE — ED Notes (Signed)
[id number] Expected date:04/26/11<BR> Expected time:11:34 AM<BR> Means of arrival:Ambulance<BR> Comments:<BR> EMS 31 GC - n/v/weakness

## 2011-04-29 LAB — BLOOD GAS, VENOUS
Patient temperature: 98.6
pH, Ven: 7.356 — ABNORMAL HIGH (ref 7.250–7.300)

## 2011-06-24 ENCOUNTER — Emergency Department (HOSPITAL_COMMUNITY): Payer: Self-pay

## 2011-06-24 ENCOUNTER — Encounter (HOSPITAL_COMMUNITY): Payer: Self-pay | Admitting: *Deleted

## 2011-06-24 ENCOUNTER — Emergency Department (HOSPITAL_COMMUNITY)
Admission: EM | Admit: 2011-06-24 | Discharge: 2011-06-25 | Disposition: A | Discharge status: Home / Self Care | Payer: Self-pay | Attending: Internal Medicine | Admitting: Internal Medicine

## 2011-06-24 DIAGNOSIS — Z91199 Patient's noncompliance with other medical treatment and regimen due to unspecified reason: Secondary | ICD-10-CM | POA: Insufficient documentation

## 2011-06-24 DIAGNOSIS — K209 Esophagitis, unspecified without bleeding: Secondary | ICD-10-CM | POA: Insufficient documentation

## 2011-06-24 DIAGNOSIS — R079 Chest pain, unspecified: Secondary | ICD-10-CM | POA: Insufficient documentation

## 2011-06-24 DIAGNOSIS — R0602 Shortness of breath: Secondary | ICD-10-CM | POA: Insufficient documentation

## 2011-06-24 DIAGNOSIS — R112 Nausea with vomiting, unspecified: Secondary | ICD-10-CM | POA: Insufficient documentation

## 2011-06-24 DIAGNOSIS — Z79899 Other long term (current) drug therapy: Secondary | ICD-10-CM | POA: Insufficient documentation

## 2011-06-24 DIAGNOSIS — R1110 Vomiting, unspecified: Secondary | ICD-10-CM

## 2011-06-24 DIAGNOSIS — R000 Tachycardia, unspecified: Secondary | ICD-10-CM | POA: Insufficient documentation

## 2011-06-24 DIAGNOSIS — Z9114 Patient's other noncompliance with medication regimen: Secondary | ICD-10-CM

## 2011-06-24 DIAGNOSIS — E119 Type 2 diabetes mellitus without complications: Secondary | ICD-10-CM | POA: Insufficient documentation

## 2011-06-24 DIAGNOSIS — R062 Wheezing: Secondary | ICD-10-CM | POA: Insufficient documentation

## 2011-06-24 DIAGNOSIS — Z9119 Patient's noncompliance with other medical treatment and regimen: Secondary | ICD-10-CM | POA: Insufficient documentation

## 2011-06-24 LAB — POCT I-STAT 3, VENOUS BLOOD GAS (G3P V)
Acid-base deficit: 1 mmol/L (ref 0.0–2.0)
Bicarbonate: 23.3 mEq/L (ref 20.0–24.0)
TCO2: 24 mmol/L (ref 0–100)

## 2011-06-24 LAB — CBC
HCT: 41.2 % (ref 36.0–46.0)
MCV: 88.6 fL (ref 78.0–100.0)
RDW: 12.9 % (ref 11.5–15.5)
WBC: 10.9 10*3/uL — ABNORMAL HIGH (ref 4.0–10.5)

## 2011-06-24 LAB — TROPONIN I: Troponin I: <0.3 ng/mL (ref <0.30)

## 2011-06-24 LAB — DIFFERENTIAL
Basophils Absolute: 0 10*3/uL (ref 0.0–0.1)
Eosinophils Relative: 0 % (ref 0–5)
Lymphocytes Relative: 27 % (ref 12–46)
Monocytes Absolute: 0.6 10*3/uL (ref 0.1–1.0)
Monocytes Relative: 6 % (ref 3–12)

## 2011-06-24 LAB — COMPREHENSIVE METABOLIC PANEL
ALT: 11 U/L (ref 0–35)
AST: 19 U/L (ref 0–37)
Alkaline Phosphatase: 88 U/L (ref 39–117)
BUN: 9 mg/dL (ref 6–23)
CO2: 16 mEq/L — ABNORMAL LOW (ref 19–32)
Calcium: 9.4 mg/dL (ref 8.4–10.5)
Creatinine, Ser: 0.46 mg/dL — ABNORMAL LOW (ref 0.50–1.10)
Glucose, Bld: 302 mg/dL — ABNORMAL HIGH (ref 70–99)
Total Bilirubin: 0.7 mg/dL (ref 0.3–1.2)

## 2011-06-24 MED ORDER — ONDANSETRON HCL 4 MG/2ML IJ SOLN
4.0000 mg | Freq: Once | INTRAMUSCULAR | Status: AC
  Administered 2011-06-25: 4 mg via INTRAVENOUS
  Filled 2011-06-24: qty 2

## 2011-06-24 MED ORDER — GI COCKTAIL ~~LOC~~
30.0000 mL | Freq: Once | ORAL | Status: AC
  Administered 2011-06-24: 30 mL via ORAL

## 2011-06-24 MED ORDER — ALBUTEROL SULFATE (5 MG/ML) 0.5% IN NEBU
5.0000 mg | INHALATION_SOLUTION | Freq: Once | RESPIRATORY_TRACT | Status: AC
  Administered 2011-06-25: 5 mg via RESPIRATORY_TRACT
  Filled 2011-06-24: qty 1

## 2011-06-24 MED ORDER — SODIUM CHLORIDE 0.9 % IV BOLUS (SEPSIS): 500.0000 mL | Freq: Once | INTRAVENOUS | Status: DC

## 2011-06-24 MED ORDER — SODIUM CHLORIDE 0.9 % IV BOLUS (SEPSIS)
1000.0000 mL | Freq: Once | INTRAVENOUS | Status: AC
  Administered 2011-06-25: 1000 mL via INTRAVENOUS

## 2011-06-24 NOTE — ED Notes (Signed)
The pt arrived by gems from home.  She has had some chest tightness all day with dizziness.  She say she has been under a lot of stress lately.  Keeping both eyes closed on arrival to the ed

## 2011-06-24 NOTE — ED Provider Notes (Addendum)
History     CSN: [id number]  Arrival date & time 06/24/11  2141   First MD Initiated Contact with Patient 06/24/11 2309      Chief Complaint  Patient presents with  . Chest Pain    (Consider location/radiation/quality/duration/timing/severity/associated sxs/prior treatment) Patient is a 46 y.o. female presenting with chest pain. The history is provided by the patient. No language interpreter was used.  Chest Pain The chest pain began yesterday. Chest pain occurs constantly. The chest pain is unchanged. Associated with: nothing, patient will not say. At its most intense, the pain is at 10/10. The pain is currently at 10/10. The severity of the pain is severe. The quality of the pain is described as sharp. The pain does not radiate. Exacerbated by: denies. Primary symptoms include shortness of breath, wheezing, nausea and vomiting.  Pertinent negatives for associated symptoms include no diaphoresis. She tried nothing for the symptoms. Risk factors include obesity.  Her past medical history is significant for diabetes.  Pertinent negatives for family medical history include: no Marfan's syndrome in family.  Procedure history is negative for cardiac catheterization.     Past Medical History  Diagnosis Date  . Diabetes mellitus     Past Surgical History  Procedure Date  . Cholecystectomy     No family history on file.  History  Substance Use Topics  . Smoking status: Not on file  . Smokeless tobacco: Not on file  . Alcohol Use: No    OB History    Grav Para Term Preterm Abortions TAB SAB Ect Mult Living                  Review of Systems  Constitutional: Negative for diaphoresis.  Respiratory: Positive for shortness of breath and wheezing.   Cardiovascular: Positive for chest pain.  Gastrointestinal: Positive for nausea and vomiting.  All other systems reviewed and are negative.    Allergies  Penicillins  Home Medications   Current Outpatient Rx  Name Route  Sig Dispense Refill  . METFORMIN HCL 500 MG PO TABS Oral Take 1 tablet (500 mg total) by mouth 2 (two) times daily with a meal. 60 tablet 0    BP 136/79  Pulse 108  Temp(Src) 98.5 F (36.9 C) (Oral)  Resp 16  SpO2 98%  Physical Exam  Constitutional: She appears well-developed and well-nourished. No distress.  HENT:  Head: Normocephalic and atraumatic.  Mouth/Throat: Oropharynx is clear and moist.  Eyes: Conjunctivae are normal. Pupils are equal, round, and reactive to light.  Neck: Normal range of motion. Neck supple.  Cardiovascular: Tachycardia present.   Pulmonary/Chest: She has decreased breath sounds.  Abdominal: Soft. Bowel sounds are normal. There is no rebound and no guarding.  Musculoskeletal: Normal range of motion. She exhibits no edema.  Neurological: She is alert.  Skin: Skin is warm and dry. She is not diaphoretic.  Psychiatric: She has a normal mood and affect.    ED Course  Procedures (including critical care time)  Labs Reviewed  CBC - Abnormal; Notable for the following:    WBC 10.9 (*)    All other components within normal limits  COMPREHENSIVE METABOLIC PANEL - Abnormal; Notable for the following:    Sodium 128 (*)    Chloride 91 (*)    CO2 16 (*)    Glucose, Bld 302 (*)    Creatinine, Ser 0.46 (*)    All other components within normal limits  DIFFERENTIAL  CK TOTAL AND CKMB  TROPONIN  I  D-DIMER, QUANTITATIVE  URINALYSIS, ROUTINE W REFLEX MICROSCOPIC  BLOOD GAS, VENOUS   Dg Chest 2 View  06/24/2011  *RADIOLOGY REPORT*  Clinical Data: Chest pain  CHEST - 2 VIEW  Comparison: 04/26/2010  Findings: Lungs are clear. No pleural effusion or pneumothorax. The cardiomediastinal contours are within normal limits. The visualized bones and soft tissues are without significant appreciable abnormality. Multilevel degenerative changes. Right upper quadrant surgical clips.  IMPRESSION: No acute cardiopulmonary process  Original Report Authenticated By: Suanne Marker, M.D.     No diagnosis found.    MDM   Date: 06/24/2011  Rate: 105  Rhythm: sinus tachycardia  QRS Axis: normal  Intervals: normal  ST/T Wave abnormalities: normal  Conduction Disutrbances:none  Narrative Interpretation:   Old EKG Reviewed: unchanged     Suspect gastroparesis and esophagitis but was seen by [facility] who will follow patient as an outpatient.  Will give omeprazole and metformin RX and OPC will call with appointment    Berkeley Veldman K Clete Kuch-Rasch, MD 06/25/11 0355  Arabel Barcenas K Leafy Motsinger-Rasch, MD 06/25/11 [phone]

## 2011-06-24 NOTE — ED Notes (Signed)
The pt is crying she has not been seen by a doctor yet.  She says she has indigestion

## 2011-06-25 ENCOUNTER — Encounter (HOSPITAL_COMMUNITY): Payer: Self-pay | Admitting: Internal Medicine

## 2011-06-25 ENCOUNTER — Emergency Department (HOSPITAL_COMMUNITY): Payer: Self-pay

## 2011-06-25 DIAGNOSIS — R079 Chest pain, unspecified: Secondary | ICD-10-CM | POA: Diagnosis present

## 2011-06-25 LAB — BASIC METABOLIC PANEL
CO2: 21 mEq/L (ref 19–32)
Glucose, Bld: 287 mg/dL — ABNORMAL HIGH (ref 70–99)
Potassium: 3.8 mEq/L (ref 3.5–5.1)
Sodium: 135 mEq/L (ref 135–145)

## 2011-06-25 LAB — URINE MICROSCOPIC-ADD ON

## 2011-06-25 LAB — URINALYSIS, ROUTINE W REFLEX MICROSCOPIC
Glucose, UA: >1000 mg/dL — AB
Ketones, ur: >80 mg/dL — AB
Leukocytes, UA: NEGATIVE
Protein, ur: NEGATIVE mg/dL

## 2011-06-25 LAB — LIPID PANEL
HDL: 44 mg/dL (ref >39)
LDL Cholesterol: 133 mg/dL — ABNORMAL HIGH (ref 0–99)
Triglycerides: 69 mg/dL (ref <150)
VLDL: 14 mg/dL (ref 0–40)

## 2011-06-25 LAB — POCT PREGNANCY, URINE: Preg Test, Ur: NEGATIVE

## 2011-06-25 MED ORDER — OMEPRAZOLE 20 MG PO CPDR: 20.0000 mg | DELAYED_RELEASE_CAPSULE | Freq: Every day | ORAL | Status: DC

## 2011-06-25 MED ORDER — IOHEXOL 350 MG/ML SOLN
80.0000 mL | Freq: Once | INTRAVENOUS | Status: AC | PRN
  Administered 2011-06-25: 80 mL via INTRAVENOUS

## 2011-06-25 MED ORDER — PANTOPRAZOLE SODIUM 40 MG IV SOLR
40.0000 mg | Freq: Once | INTRAVENOUS | Status: AC
  Administered 2011-06-25: 40 mg via INTRAVENOUS
  Filled 2011-06-25: qty 40

## 2011-06-25 MED ORDER — SODIUM CHLORIDE 0.9 % IV BOLUS (SEPSIS)
1000.0000 mL | Freq: Once | INTRAVENOUS | Status: AC
  Administered 2011-06-25: 1000 mL via INTRAVENOUS

## 2011-06-25 MED ORDER — METFORMIN HCL 500 MG PO TABS: 500.0000 mg | ORAL_TABLET | Freq: Two times a day (BID) | ORAL | Status: DC

## 2011-06-25 NOTE — Discharge Planning (Signed)
Hospital Admission Note Date: 06/25/2011  Patient name: [name] Medical record number: [id number] Date of birth: [birth year] Age: 46 y.o. Gender: female PCP: No primary provider on file.  Medical Service:          Internal Medicine Teaching Service     Chief Complaint:  Chief Complaint  Patient presents with  . Chest Pain     History of Present Illness: [name] is a 46 y.o.female with past medical history significant for diabetes and asthma who presents with nausea, vomiting and chest pain  for 2 days.    [name] states that she began having chest pain and shortness of breath 2 days ago. She states she has sharp pain in her central chest that began suddenly. Each episode lasts about 1 second. States that she last had the pain on Saturday. She denies pleuritic pain or any alleviating or aggrivating factors.  Denies wheezing with SOB. Denies recent travel or sick contacts  Denies caugh. Endorses dizzyness with standing and nausea but no vomiting.   Review of Systems: Bold Items are Positive Constitutional: Fever, chills, diaphoresis, appetite change and fatigue.  HEENT:blurry vision, photophobia,  congestion, sore throat, rhinorrhea, sneezing, mouth sores, trouble swallowing,  Respiratory: SOB (resolved), DOE, cough, chest tightness,  and wheezing (resolved).   Cardiovascular: Chest pain (resolved), palpitations and leg swelling.  Gastrointestinal: Nausea, vomiting, abdominal pain, diarrhea, constipation, blood in stool and abdominal distention.  Genitourinary: Dysuria, urgency, frequency, hematuria, flank pain and difficulty urinating.  Musculoskeletal: Myalgias Skin: Pallor, rash and wound.  Neurological: Dizziness,  syncope, weakness, light-headedness, numbness and headaches.   Psychiatric/Behavioral: Suicidal ideation  Past Medical History  Diagnosis Date  . Diabetes mellitus   Asthma   Past Surgical History  Procedure Date  . Cholecystectomy      Meds: Metformin 500 mg BID - Currently not taking   Allergies: Penicillins- causing rash    History   Social History  . Marital Status: Legally Separated    Spouse Name: N/A    Number of Children: N/A  . Years of Education: N/A   Occupational History  . Not on file.   Social History Main Topics  . Smoking status: Former Smoker -- 0.0 packs/day  . Smokeless tobacco: Not on file  . Alcohol Use: No  . Drug Use: No  . Sexually Active: Yes   Other Topics Concern  . Not on file   Social History Narrative   Lives with 3 children. DOes not work      Physical Exam: Blood pressure 111/67, pulse 100, temperature 98.5 F (36.9 C), temperature source Oral, resp. rate 18, last menstrual period 05/26/2011, SpO2 96.00%. Gen: Well-developed, well-nourished female  in no acute distress; alert, appropriate and cooperative throughout examination.. Eyes: PERRL, EOMI, No signs of anemia or jaundince. Nose: Mucous membranes dry, not inflammed, nonerythematous. Neck: Supple with no deformities,  Lungs: Normal respiratory effort. Clear to auscultation BL, without crackles or wheezes. Heart: RRR. S1 and S2 normal without  murmur, gallop,or rubs. Abdomen: BS normoactive. Soft, nondistended, non-tender. No masses or organomegaly. Extremities: No pretibial edema. Neurologic: A&O X3, CN II - XII are grossly intact. Motor strength is 5/5 in the all 4 extremities, Sensations intact to light touch. No focal neurologic deficit Skin: No visible rashes, scars.  Lab results: Basic Metabolic Panel:  Basename 06/25/11 0350 06/24/11 2151  NA 135 128*  K 3.8 4.0  CL 97 91*  CO2 21 16*  GLUCOSE 287* 302*  BUN 9 9  CREATININE  0.43* 0.46*  CALCIUM 8.9 9.4  MG -- --  PHOS -- --  AG of 21  Liver Function Tests:  Basename 06/24/11 2151  AST 19  ALT 11  ALKPHOS 88  BILITOT 0.7  PROT 8.0  ALBUMIN 4.2   CBC:  Basename 06/24/11 2151  WBC 10.9*  NEUTROABS 7.2  HGB 14.3  HCT 41.2   MCV 88.6  PLT 285   Cardiac Enzymes:  Basename 06/25/11 0350 06/24/11 2152  CKTOTAL 22 32  CKMB 0.8 0.7  CKMBINDEX -- --  TROPONINI <0.30 <0.30   D-Dimer:  Basename 06/24/11 2334  DDIMER 1.32*   CBG:  Basename 06/25/11 0518  GLUCAP 282*   Fasting Lipid Panel:  Basename 06/25/11 0350  CHOL 191  HDL 44  LDLCALC 133*  TRIG 69  CHOLHDL 4.3  LDLDIRECT --   Urine Drug Screen: Drugs of Abuse     Component Value Date/Time   LABOPIA NONE DETECTED 06/25/2011 0051   COCAINSCRNUR NONE DETECTED 06/25/2011 0051   LABBENZ NONE DETECTED 06/25/2011 0051   AMPHETMU NONE DETECTED 06/25/2011 0051   THCU NONE DETECTED 06/25/2011 0051   LABBARB NONE DETECTED 06/25/2011 0051    Urinalysis:  Basename 06/25/11 0053  COLORURINE STRAW*  LABSPEC 1.024  PHURINE 5.5  GLUCOSEU >1000*  HGBUR NEGATIVE  BILIRUBINUR NEGATIVE  KETONESUR >80*  PROTEINUR NEGATIVE  UROBILINOGEN 1.0  NITRITE NEGATIVE  LEUKOCYTESUR NEGATIVE     Imaging results:  Dg Chest 2 View  06/24/2011  *RADIOLOGY REPORT*  Clinical Data: Chest pain  CHEST - 2 VIEW  Comparison: 04/26/2010  Findings: Lungs are clear. No pleural effusion or pneumothorax. The cardiomediastinal contours are within normal limits. The visualized bones and soft tissues are without significant appreciable abnormality. Multilevel degenerative changes. Right upper quadrant surgical clips.  IMPRESSION: No acute cardiopulmonary process  Original Report Authenticated By: Suanne Marker, M.D.   Ct Angio Chest W/cm &/or Wo Cm  06/25/2011  *RADIOLOGY REPORT*  Clinical Data: Shortness of breath, chest pain.  CT ANGIOGRAPHY CHEST  Technique:  Multidetector CT imaging of the chest using the standard protocol during bolus administration of intravenous contrast. Multiplanar reconstructed images including MIPs were obtained and reviewed to evaluate the vascular anatomy.  Contrast: 73mL OMNIPAQUE IOHEXOL 350 MG/ML SOLN  Comparison: 06/24/2011 radiograph   Findings: No pulmonary arterial branch filling defects identified. Normal caliber aorta.  Mild scattered atherosclerosis of the aorta. Heart size within normal limits.  No pericardial or pleural effusion.  Limited images through the upper abdomen show no acute abnormality.  Small hiatal hernia. Circumferential distal esophageal wall thickening.  No intrathoracic lymphadenopathy.  Central airways are patent.  No focal consolidation with exception of minimal dependent atelectasis at the right lung base.  No pneumothorax.  Multilevel degenerative changes/flowing anterior osteophytes.  No acute osseous abnormality.  IMPRESSION: No pulmonary arterial filling defect identified.  Circumferential distal esophageal wall thickening and small hiatal hernia, nonspecific.  Correlate clinically if concern for esophagitis or gastroesophageal reflux disease.  Original Report Authenticated By: Suanne Marker, M.D.    Other results: EKG:  Sinus tachycardia.  Assessment & Plan by Problem: Patient's chest pain and shortness of breath resolved by the time we evaluated the patient. She was ready to go home. CT angiogram was negative for PE, cardiac enzymes x2 were negative. EKG did not show any ischemic changes. Patient had some episodes of nausea but no emesis which resolved with Zofran. Discussed with ED physician and patient to discharge home and followup with the  outpatient clinic to establish care. She was given prescription for metformin and omeprazole since CT and ear showed some changes consistent with esophagitis. Labs to followup lipid panel, hemoglobin A1c and TSH.   SignedRosalia Hammers 06/25/2011, 6:45 AM

## 2011-06-25 NOTE — ED Notes (Signed)
Patient tearful upon discharge, reports she has no ride to pick her up from the Emergency Department.  Bus pass ordered by Education officer, museum.

## 2011-06-25 NOTE — Discharge Instructions (Signed)
B.R.A.T. Diet Your doctor has recommended the B.R.A.T. diet for you or your child until the condition improves. This is often used to help control diarrhea and vomiting symptoms. If you or your child can tolerate clear liquids, you may have:  Bananas.   Rice.   Applesauce.   Toast (and other simple starches such as crackers, potatoes, noodles).  Be sure to avoid dairy products, meats, and fatty foods until symptoms are better. Fruit juices such as apple, grape, and prune juice can make diarrhea worse. Avoid these. Continue this diet for 2 days or as instructed by your caregiver. Document Released: 02/11/2005 Document Revised: 01/31/2011 Document Reviewed: 07/31/2006 [employer] Patient Information 2012 [employer]. 

## 2011-06-25 NOTE — ED Notes (Signed)
Pt asleep.

## 2011-06-25 NOTE — ED Notes (Signed)
Pt resting; fluids finishing. PT able to hold down PO liquids.

## 2011-06-25 NOTE — ED Notes (Signed)
CSW spoke with Charge Nurse Ophelia Charter who reported that Pt needed assistance with transportation home but was acting like she was not in a hurry to leave. CSW met with Pt in ED lobby. Pt was withdrawn and tearful. She reported that she did not want to go home but was vague as to why. One time the Pt did share that "there are too many people living there."  Pt stated that she wanted to go somewhere for a couple of days. Pt's tone was very low, tearful, limited eye-contact, mostly looking down.  Pt denies not being safe at apartment, reports living there since last July. Pt denies SI or HI. CSW offered Pt a bus pass home, pt stated that she was grateful but that she didn't have the energy to walk. CSW offered a taxi voucher for Pt and gave her information for [facility] (local day shelter with case management services) and [facility] (woman's shelter in [address]  Pt denies having an income, states that she is looking for employment. CSW encouraged Pt to f/u with [facility] for job assistance as well as low rent housing. Pt also given info for a walk-in appt at [facility] for concerns with depression/anxiety. Chrys Racer, [place]

## 2011-06-25 NOTE — ED Notes (Signed)
The pt refused the hhn

## 2011-06-25 NOTE — ED Notes (Signed)
Pt ambulated with a steady gait; VSS; A&Ox3; no signs of distress; respirations even and unlabored; skin warm and dry. No questions at this time.

## 2011-06-25 NOTE — ED Notes (Signed)
1000 ccnss  Added to saline lok.  zofran given.  The pt feel better after the gi cockltail

## 2011-06-25 NOTE — ED Notes (Signed)
The pt returned from c-t c/o pain

## 2011-07-27 ENCOUNTER — Encounter (HOSPITAL_COMMUNITY): Payer: Self-pay | Admitting: Emergency Medicine

## 2011-07-27 ENCOUNTER — Emergency Department (HOSPITAL_COMMUNITY)
Admission: EM | Admit: 2011-07-27 | Discharge: 2011-07-27 | Disposition: A | Discharge status: Home / Self Care | Payer: Self-pay | Attending: Emergency Medicine | Admitting: Emergency Medicine

## 2011-07-27 DIAGNOSIS — E119 Type 2 diabetes mellitus without complications: Secondary | ICD-10-CM | POA: Insufficient documentation

## 2011-07-27 DIAGNOSIS — H109 Unspecified conjunctivitis: Secondary | ICD-10-CM | POA: Insufficient documentation

## 2011-07-27 DIAGNOSIS — Z87891 Personal history of nicotine dependence: Secondary | ICD-10-CM | POA: Insufficient documentation

## 2011-07-27 MED ORDER — TOBRAMYCIN 0.3 % OP SOLN
2.0000 [drp] | Freq: Four times a day (QID) | OPHTHALMIC | Status: DC
  Administered 2011-07-27: 2 [drp] via OPHTHALMIC
  Filled 2011-07-27 (×2): qty 5

## 2011-07-27 NOTE — ED Notes (Signed)
Pt. Stated, I have pink eye for 2 days.

## 2011-07-27 NOTE — Discharge Instructions (Signed)
Please use drops 2 drop every 6 hours for 5 days.   Conjunctivitis Conjunctivitis is commonly called "pink eye." Conjunctivitis can be caused by bacterial or viral infection, allergies, or injuries. There is usually redness of the lining of the eye, itching, discomfort, and sometimes discharge. There may be deposits of matter along the eyelids. A viral infection usually causes a watery discharge, while a bacterial infection causes a yellowish, thick discharge. Pink eye is very contagious and spreads by direct contact. You may be given antibiotic eyedrops as part of your treatment. Before using your eye medicine, remove all drainage from the eye by washing gently with warm water and cotton balls. Continue to use the medication until you have awakened 2 mornings in a row without discharge from the eye. Do not rub your eye. This increases the irritation and helps spread infection. Use separate towels from other household members. Wash your hands with soap and water before and after touching your eyes. Use cold compresses to reduce pain and sunglasses to relieve irritation from light. Do not wear contact lenses or wear eye makeup until the infection is gone. SEEK MEDICAL CARE IF:   Your symptoms are not better after 3 days of treatment.   You have increased pain or trouble seeing.   The outer eyelids become very red or swollen.  Document Released: 03/21/2004 Document Revised: 01/31/2011 Document Reviewed: 02/11/2005 [facility] Patient Information 2012 [place].

## 2011-07-27 NOTE — ED Provider Notes (Signed)
History     CSN: [id number]  Arrival date & time 07/27/11  0949   First MD Initiated Contact with Patient 07/27/11 1050     HPI Pt reports for 2 days has had increasing eye itchiness, redness and yellowish green drainage. Reports eyes have been stuck together in the morning. Denies eye pain, change in vision, foreign body in eye, fever, rhinorrhea or congestion.   Patient is a 46 y.o. female presenting with conjunctivitis. The history is provided by the patient.  Conjunctivitis  The current episode started 2 days ago. The onset was gradual. The problem occurs continuously. The problem has been gradually worsening. The problem is mild. The symptoms are relieved by nothing. The symptoms are aggravated by light. Associated symptoms include eye itching, eye discharge and eye redness. Pertinent negatives include no fever, no photophobia, no congestion, no rhinorrhea and no eye pain.    Past Medical History  Diagnosis Date  . Diabetes mellitus   . Asthma     Child    Past Surgical History  Procedure Date  . Cholecystectomy     Family History  Problem Relation Age of Onset  . Diabetes Mother     History  Substance Use Topics  . Smoking status: Former Smoker -- 0.0 packs/day  . Smokeless tobacco: Not on file   Comment: Smoked when she was a teenager.   . Alcohol Use: No    OB History    Grav Para Term Preterm Abortions TAB SAB Ect Mult Living                  Review of Systems  Constitutional: Negative for fever and chills.  HENT: Negative for congestion and rhinorrhea.   Eyes: Positive for discharge, redness and itching. Negative for photophobia, pain and visual disturbance.  All other systems reviewed and are negative.    Allergies  Penicillins  Home Medications   Current Outpatient Rx  Name Route Sig Dispense Refill  . METFORMIN HCL 500 MG PO TABS Oral Take 500 mg by mouth 2 (two) times daily with a meal.    . OMEPRAZOLE 20 MG PO CPDR Oral Take 20 mg by mouth  daily.      BP 141/93  Pulse 80  Temp(Src) 98 F (36.7 C) (Oral)  Resp 18  SpO2 97%  LMP 06/24/2011  Physical Exam  Vitals reviewed. Constitutional: She is oriented to person, place, and time. Vital signs are normal. She appears well-developed and well-nourished. No distress.  HENT:  Head: Normocephalic and atraumatic.  Right Ear: External ear normal.  Left Ear: External ear normal.  Nose: Nose normal.  Mouth/Throat: Oropharynx is clear and moist. No oropharyngeal exudate.  Eyes: EOM are normal. Pupils are equal, round, and reactive to light. Right eye exhibits discharge and exudate. Left eye exhibits discharge and exudate. Right conjunctiva is injected. Left conjunctiva is injected. Right eye exhibits normal extraocular motion. Left eye exhibits normal extraocular motion.  Fundoscopic exam:      The right eye shows no exudate and no hemorrhage.       The left eye shows no exudate and no hemorrhage.  Neck: Neck supple.  Pulmonary/Chest: Effort normal.  Neurological: She is alert and oriented to person, place, and time.  Skin: Skin is warm and dry. No rash noted. No erythema. No pallor.  Psychiatric: She has a normal mood and affect. Her behavior is normal.    ED Course  Procedures    MDM  Pt is not complaining  of eye pain of visual changes. Symptoms consistent with conjuctivitis  Dx Conjunctivitis Tx tobramycin Drops X 5 days.      Sheliah Mends, [name] 07/27/11 1558

## 2011-07-29 NOTE — ED Provider Notes (Signed)
Medical screening examination/treatment/procedure(s) were performed by non-physician practitioner and as supervising physician I was immediately available for consultation/collaboration.  Barbara Cower, MD 07/29/11 [phone]

## 2011-07-30 ENCOUNTER — Encounter: Payer: Self-pay | Admitting: Internal Medicine

## 2011-09-12 ENCOUNTER — Encounter (HOSPITAL_COMMUNITY): Payer: Self-pay | Admitting: Emergency Medicine

## 2011-09-12 ENCOUNTER — Emergency Department (HOSPITAL_COMMUNITY)
Admission: EM | Admit: 2011-09-12 | Discharge: 2011-09-12 | Discharge status: Against Medical Advice | Payer: Self-pay | Attending: Emergency Medicine | Admitting: Emergency Medicine

## 2011-09-12 DIAGNOSIS — R109 Unspecified abdominal pain: Secondary | ICD-10-CM | POA: Insufficient documentation

## 2011-09-12 LAB — URINALYSIS, ROUTINE W REFLEX MICROSCOPIC
Bilirubin Urine: NEGATIVE
Ketones, ur: NEGATIVE mg/dL
Leukocytes, UA: NEGATIVE
Nitrite: POSITIVE — AB
Specific Gravity, Urine: 1.028 (ref 1.005–1.030)
Urobilinogen, UA: 0.2 mg/dL (ref 0.0–1.0)

## 2011-09-12 NOTE — ED Notes (Addendum)
Pt c/o increased abd pain and pt sts "feels like something is moving" in stomach; pt sts LMP was spotting in July and then before that in April; pt unsure if could be gas; pt denies N/V/D

## 2011-09-12 NOTE — ED Notes (Signed)
Called x 2 for vital signs

## 2011-09-12 NOTE — ED Notes (Signed)
Pt did not respond x 2.

## 2012-09-03 ENCOUNTER — Other Ambulatory Visit: Payer: Self-pay

## 2013-09-21 ENCOUNTER — Emergency Department (INDEPENDENT_AMBULATORY_CARE_PROVIDER_SITE_OTHER)
Admission: EM | Admit: 2013-09-21 | Discharge: 2013-09-21 | Discharge status: Against Medical Advice | Payer: Self-pay | Source: Home / Self Care | Attending: Family Medicine | Admitting: Family Medicine

## 2013-09-21 ENCOUNTER — Encounter (HOSPITAL_COMMUNITY): Payer: Self-pay | Admitting: Emergency Medicine

## 2013-09-21 ENCOUNTER — Emergency Department (HOSPITAL_COMMUNITY)
Admission: EM | Admit: 2013-09-21 | Discharge: 2013-09-21 | Discharge status: Against Medical Advice | Payer: Self-pay | Attending: Emergency Medicine | Admitting: Emergency Medicine

## 2013-09-21 DIAGNOSIS — J45909 Unspecified asthma, uncomplicated: Secondary | ICD-10-CM | POA: Insufficient documentation

## 2013-09-21 DIAGNOSIS — E119 Type 2 diabetes mellitus without complications: Secondary | ICD-10-CM | POA: Insufficient documentation

## 2013-09-21 DIAGNOSIS — R5381 Other malaise: Secondary | ICD-10-CM

## 2013-09-21 DIAGNOSIS — R5383 Other fatigue: Secondary | ICD-10-CM

## 2013-09-21 DIAGNOSIS — F411 Generalized anxiety disorder: Secondary | ICD-10-CM | POA: Insufficient documentation

## 2013-09-21 DIAGNOSIS — R2990 Unspecified symptoms and signs involving the nervous system: Secondary | ICD-10-CM

## 2013-09-21 DIAGNOSIS — F410 Panic disorder [episodic paroxysmal anxiety] without agoraphobia: Secondary | ICD-10-CM | POA: Insufficient documentation

## 2013-09-21 DIAGNOSIS — R824 Acetonuria: Secondary | ICD-10-CM

## 2013-09-21 DIAGNOSIS — R531 Weakness: Secondary | ICD-10-CM

## 2013-09-21 DIAGNOSIS — R112 Nausea with vomiting, unspecified: Secondary | ICD-10-CM

## 2013-09-21 DIAGNOSIS — R29818 Other symptoms and signs involving the nervous system: Secondary | ICD-10-CM

## 2013-09-21 LAB — POCT URINALYSIS DIP (DEVICE)
Bilirubin Urine: NEGATIVE
Glucose, UA: >=1000 mg/dL — AB
Hgb urine dipstick: NEGATIVE
NITRITE: POSITIVE — AB
PH: 5.5 (ref 5.0–8.0)
PROTEIN: NEGATIVE mg/dL
Specific Gravity, Urine: 1.015 (ref 1.005–1.030)
UROBILINOGEN UA: 0.2 mg/dL (ref 0.0–1.0)

## 2013-09-21 LAB — GLUCOSE, CAPILLARY: Glucose-Capillary: 253 mg/dL — ABNORMAL HIGH (ref 70–99)

## 2013-09-21 LAB — POCT PREGNANCY, URINE: Preg Test, Ur: NEGATIVE

## 2013-09-21 NOTE — ED Notes (Signed)
Pt arrived by [facility] for anxiety and panic attack. Per ems, pt was hyperventilating on their arrival, they were able to calm her down and respirations slowed down, pt now has no complaints. Reports high stress levels at home and hx of anxiety but doesn't take meds for it. Denies SI or HI. Pt was seen at [facility] this am for hyperglycemia.

## 2013-09-21 NOTE — ED Notes (Signed)
C/o nausea and dizzy since yesterday Patient stated she laid down as tx States she is weak

## 2013-09-21 NOTE — ED Notes (Signed)
Pt reports feeling much better and doesn't want to stay for eval. Informed pt to return for any worsening symptoms. No resp distress noted at triage, airway intact.

## 2013-09-21 NOTE — ED Provider Notes (Signed)
Medical screening examination/treatment/procedure(s) were performed by resident physician or non-physician practitioner and as supervising physician I was immediately available for consultation/collaboration.   Pauline Good MD.   Billy Fischer, MD 09/21/13 [phone]

## 2013-09-21 NOTE — ED Provider Notes (Signed)
CSN: [id number]     Arrival date & time 09/21/13  1101 History   First MD Initiated Contact with Patient 09/21/13 1132     Chief Complaint  Patient presents with  . Nausea  . Dizziness   (Consider location/radiation/quality/duration/timing/severity/associated sxs/prior Treatment) HPI Comments: 48 year old female with history of diabetes and asthma presents for evaluation of weakness, dizziness, nausea, vomiting.her symptoms began 2 days ago with weakness and dizziness/lightheadedness. She felt nauseous, and finally vomited once this morning. She has had this once in the past, she states it was caused by "gastritis disease." She denies any injury, hematuria, dysuria, flank pain, vaginal bleeding, dark stools. She says she has a history of an irregular heartbeat, she cannot specify any further than that and she does not take any blood thinners.    Past Medical History  Diagnosis Date  . Diabetes mellitus   . Asthma     Child   Past Surgical History  Procedure Laterality Date  . Cholecystectomy     Family History  Problem Relation Age of Onset  . Diabetes Mother    History  Substance Use Topics  . Smoking status: Former Smoker -- 0.00 packs/day  . Smokeless tobacco: Not on file     Comment: Smoked when she was a teenager.   . Alcohol Use: No   OB History   Grav Para Term Preterm Abortions TAB SAB Ect Mult Living                 Review of Systems  Constitutional: Positive for fatigue. Negative for fever and chills.  Eyes: Negative for visual disturbance.  Respiratory: Negative for cough and shortness of breath.   Cardiovascular: Negative for chest pain, palpitations and leg swelling.  Gastrointestinal: Positive for nausea, vomiting and abdominal pain (relieved by vomiting). Negative for diarrhea and constipation.  Endocrine: Negative for polydipsia and polyuria.  Genitourinary: Negative for dysuria, urgency and frequency.  Musculoskeletal: Negative for arthralgias and  myalgias.  Skin: Negative for rash.  Neurological: Positive for dizziness, weakness, light-headedness, numbness (left hand, chronic) and headaches. Negative for facial asymmetry.  All other systems reviewed and are negative.   Allergies  Penicillins  Home Medications   Prior to Admission medications   Medication Sig Start Date End Date Taking? Authorizing Provider  acetaminophen (TYLENOL) 500 MG tablet Take 1,000 mg by mouth every 6 (six) hours as needed. For pain    Historical Provider, MD   Temp(Src) 99 F (37.2 C)  Resp 20  SpO2 100%  LMP 08/22/2013 Physical Exam  Nursing note and vitals reviewed. Constitutional: She is oriented to person, place, and time. Vital signs are normal. She appears well-developed and well-nourished. No distress.  HENT:  Head: Normocephalic and atraumatic.  Cardiovascular: Normal rate, regular rhythm and normal heart sounds.   Pulmonary/Chest: Effort normal and breath sounds normal. No respiratory distress. She has no wheezes. She has no rales.  Neurological: She is alert and oriented to person, place, and time. She has normal strength. A cranial nerve deficit is present. No sensory deficit. She exhibits normal muscle tone. She displays a negative Romberg sign. Coordination and gait normal. GCS eye subscore is 4. GCS verbal subscore is 5. GCS motor subscore is 6.  Possible mild right-sided facial droop and slight deviation of tongue protrusion to the left. She exhibits proximal muscle weakness, with extreme difficulty climbing onto the bed. She exhibits grip strength weakness on the left, lower extremity is equal bilaterally  Skin: Skin is warm and dry.  No rash noted. She is not diaphoretic.  Psychiatric: She has a normal mood and affect. Judgment normal.    ED Course  ED EKG  Date/Time: 09/21/2013 12:52 PM Performed by: Allena Katz, H Authorized by: Allena Katz, H Comparison: compared with previous ECG  Rhythm: sinus rhythm Rate: normal QRS  axis: normal Conduction: conduction normal ST Segments: ST segments normal T Waves: T waves normal Clinical impression: normal ECG   (including critical care time) Labs Review Labs Reviewed  GLUCOSE, CAPILLARY - Abnormal; Notable for the following:    Glucose-Capillary 253 (*)    All other components within normal limits  POCT URINALYSIS DIP (DEVICE) - Abnormal; Notable for the following:    Glucose, UA >=1000 (*)    Ketones, ur TRACE (*)    Nitrite POSITIVE (*)    Leukocytes, UA SMALL (*)    All other components within normal limits  POCT PREGNANCY, URINE    Imaging Review No results found.   MDM   1. Nausea and vomiting, vomiting of unspecified type   2. Ketonuria   3. Abnormal neurological exam   4. Weakness    Symptoms suggestive of ketoacidosis/hyperosmolar hyperglycemic state, also abnormal neurologic exam may be due to elevated blood sugar but needs to have stroke ruled out. I've explained this to the patient in great detail numerous times but she is insisting on leaving AMA and coming to the hospital later. I explained to the patient that she has a potentially life-threatening condition, she states that she will not drive but she must go take care of her grandchildren. She signed out Epes, [name] 09/21/13 [place] Lynetta Tomczak, [name] 09/21/13 1254

## 2013-10-04 ENCOUNTER — Encounter (HOSPITAL_COMMUNITY): Payer: Self-pay | Admitting: Emergency Medicine

## 2013-10-04 ENCOUNTER — Emergency Department (HOSPITAL_COMMUNITY)
Admission: EM | Admit: 2013-10-04 | Discharge: 2013-10-05 | Disposition: A | Discharge status: Home / Self Care | Payer: Self-pay | Attending: Emergency Medicine | Admitting: Emergency Medicine

## 2013-10-04 ENCOUNTER — Emergency Department (HOSPITAL_COMMUNITY): Payer: Self-pay

## 2013-10-04 DIAGNOSIS — R109 Unspecified abdominal pain: Secondary | ICD-10-CM | POA: Insufficient documentation

## 2013-10-04 DIAGNOSIS — R739 Hyperglycemia, unspecified: Secondary | ICD-10-CM

## 2013-10-04 DIAGNOSIS — E118 Type 2 diabetes mellitus with unspecified complications: Secondary | ICD-10-CM

## 2013-10-04 DIAGNOSIS — Z87891 Personal history of nicotine dependence: Secondary | ICD-10-CM | POA: Insufficient documentation

## 2013-10-04 DIAGNOSIS — Z880 Allergy status to penicillin: Secondary | ICD-10-CM | POA: Insufficient documentation

## 2013-10-04 DIAGNOSIS — J45909 Unspecified asthma, uncomplicated: Secondary | ICD-10-CM | POA: Insufficient documentation

## 2013-10-04 DIAGNOSIS — Z9089 Acquired absence of other organs: Secondary | ICD-10-CM | POA: Insufficient documentation

## 2013-10-04 DIAGNOSIS — N12 Tubulo-interstitial nephritis, not specified as acute or chronic: Secondary | ICD-10-CM | POA: Insufficient documentation

## 2013-10-04 DIAGNOSIS — E119 Type 2 diabetes mellitus without complications: Secondary | ICD-10-CM | POA: Insufficient documentation

## 2013-10-04 LAB — COMPREHENSIVE METABOLIC PANEL
ALBUMIN: 3.6 g/dL (ref 3.5–5.2)
ALK PHOS: 99 U/L (ref 39–117)
ALT: 8 U/L (ref 0–35)
ANION GAP: 18 — AB (ref 5–15)
AST: 15 U/L (ref 0–37)
BUN: 6 mg/dL (ref 6–23)
CALCIUM: 9.5 mg/dL (ref 8.4–10.5)
CO2: 23 mEq/L (ref 19–32)
Chloride: 94 mEq/L — ABNORMAL LOW (ref 96–112)
Creatinine, Ser: 0.54 mg/dL (ref 0.50–1.10)
GFR calc non Af Amer: >90 mL/min (ref >90)
Glucose, Bld: 485 mg/dL — ABNORMAL HIGH (ref 70–99)
POTASSIUM: 3.9 meq/L (ref 3.7–5.3)
SODIUM: 135 meq/L — AB (ref 137–147)
TOTAL PROTEIN: 8.2 g/dL (ref 6.0–8.3)
Total Bilirubin: 0.5 mg/dL (ref 0.3–1.2)

## 2013-10-04 LAB — CBC WITH DIFFERENTIAL/PLATELET
BASOS PCT: 0 % (ref 0–1)
Basophils Absolute: 0 10*3/uL (ref 0.0–0.1)
EOS ABS: 0 10*3/uL (ref 0.0–0.7)
EOS PCT: 0 % (ref 0–5)
HCT: 38.8 % (ref 36.0–46.0)
Hemoglobin: 13.5 g/dL (ref 12.0–15.0)
Lymphocytes Relative: 17 % (ref 12–46)
Lymphs Abs: 2.4 10*3/uL (ref 0.7–4.0)
MCH: 31.8 pg (ref 26.0–34.0)
MCHC: 34.8 g/dL (ref 30.0–36.0)
MCV: 91.3 fL (ref 78.0–100.0)
Monocytes Absolute: 0.8 10*3/uL (ref 0.1–1.0)
Monocytes Relative: 5 % (ref 3–12)
NEUTROS PCT: 78 % — AB (ref 43–77)
Neutro Abs: 10.8 10*3/uL — ABNORMAL HIGH (ref 1.7–7.7)
PLATELETS: 314 10*3/uL (ref 150–400)
RBC: 4.25 MIL/uL (ref 3.87–5.11)
RDW: 12.3 % (ref 11.5–15.5)
WBC: 14 10*3/uL — ABNORMAL HIGH (ref 4.0–10.5)

## 2013-10-04 LAB — URINALYSIS, ROUTINE W REFLEX MICROSCOPIC
Bilirubin Urine: NEGATIVE
Glucose, UA: >1000 mg/dL — AB
Ketones, ur: 15 mg/dL — AB
Nitrite: NEGATIVE
PROTEIN: 30 mg/dL — AB
SPECIFIC GRAVITY, URINE: 1.024 (ref 1.005–1.030)
UROBILINOGEN UA: 1 mg/dL (ref 0.0–1.0)
pH: 6 (ref 5.0–8.0)

## 2013-10-04 LAB — WET PREP, GENITAL
TRICH WET PREP: NONE SEEN
YEAST WET PREP: NONE SEEN

## 2013-10-04 LAB — URINE MICROSCOPIC-ADD ON

## 2013-10-04 LAB — CBG MONITORING, ED
Glucose-Capillary: 382 mg/dL — ABNORMAL HIGH (ref 70–99)
Glucose-Capillary: 409 mg/dL — ABNORMAL HIGH (ref 70–99)

## 2013-10-04 LAB — PREGNANCY, URINE: Preg Test, Ur: NEGATIVE

## 2013-10-04 LAB — LIPASE, BLOOD: LIPASE: 10 U/L — AB (ref 11–59)

## 2013-10-04 MED ORDER — SODIUM CHLORIDE 0.9 % IV BOLUS (SEPSIS)
1000.0000 mL | INTRAVENOUS | Status: AC
  Administered 2013-10-04: 1000 mL via INTRAVENOUS

## 2013-10-04 MED ORDER — DEXTROSE 5 % IV SOLN
1.0000 g | Freq: Once | INTRAVENOUS | Status: AC
  Administered 2013-10-04: 1 g via INTRAVENOUS
  Filled 2013-10-04: qty 10

## 2013-10-04 MED ORDER — OXYCODONE-ACETAMINOPHEN 5-325 MG PO TABS
1.0000 | ORAL_TABLET | Freq: Once | ORAL | Status: AC
  Administered 2013-10-04: 1 via ORAL
  Filled 2013-10-04: qty 1

## 2013-10-04 MED ORDER — SODIUM CHLORIDE 0.9 % IV SOLN: INTRAVENOUS | Status: DC

## 2013-10-04 MED ORDER — IOHEXOL 300 MG/ML  SOLN
80.0000 mL | Freq: Once | INTRAMUSCULAR | Status: AC | PRN
  Administered 2013-10-04: 80 mL via INTRAVENOUS

## 2013-10-04 MED ORDER — SODIUM CHLORIDE 0.9 % IV SOLN
INTRAVENOUS | Status: DC
  Administered 2013-10-04: 3.2 [IU]/h via INTRAVENOUS
  Filled 2013-10-04: qty 1

## 2013-10-04 MED ORDER — HYDROMORPHONE HCL PF 1 MG/ML IJ SOLN
1.0000 mg | Freq: Once | INTRAMUSCULAR | Status: AC
  Administered 2013-10-04: 1 mg via INTRAVENOUS
  Filled 2013-10-04: qty 1

## 2013-10-04 MED ORDER — IOHEXOL 300 MG/ML  SOLN
25.0000 mL | INTRAMUSCULAR | Status: AC
  Administered 2013-10-04: 25 mL via ORAL

## 2013-10-04 MED ORDER — ONDANSETRON 4 MG PO TBDP
8.0000 mg | ORAL_TABLET | Freq: Once | ORAL | Status: AC
  Administered 2013-10-04: 8 mg via ORAL
  Filled 2013-10-04: qty 2

## 2013-10-04 NOTE — ED Notes (Signed)
Pt to ED for evaluation of right lower abdominal pain onset Saturday, denies N/V, denies vaginal discharge or bleeding.  Pt states "its my appendix again they told me years ago I needed to have it taken out but I was afraid".

## 2013-10-04 NOTE — ED Notes (Signed)
IV attempt x 2; second RN to attempt

## 2013-10-04 NOTE — ED Provider Notes (Signed)
CSN: [id number]     Arrival date & time 10/04/13  1752 History   First MD Initiated Contact with Patient 10/04/13 2003     Chief Complaint  Patient presents with  . Abdominal Pain  (Consider location/radiation/quality/duration/timing/severity/associated sxs/prior Treatment) HPI Pt is 48 yo female with hx of diabetes, presents with c/o RLQ abd pain x 2 days.  Pt reports pain began Saturday while she was lying in bed and describes as sharp 6/10 pain that steadily became worse last night. She reports it is mainly in her RLQ but states it does radiate around to her Rt flank.  She had a normal soft bowel movement this am.  She had had some nausea yesterday but none today, she is unsure of a fever, but does endorse chills and sweating last night also.  She has had difficulty walking upright because of the pain and has not felt like eating anything since Saturday. She denies vomiting or diarrhea, rectal bleeding, vaginal discharge, or dysuria.  Past Medical History  Diagnosis Date  . Diabetes mellitus   . Asthma     Child   Past Surgical History  Procedure Laterality Date  . Cholecystectomy     Family History  Problem Relation Age of Onset  . Diabetes Mother    History  Substance Use Topics  . Smoking status: Former Smoker -- 0.00 packs/day  . Smokeless tobacco: Not on file     Comment: Smoked when she was a teenager.   . Alcohol Use: Yes   OB History   Grav Para Term Preterm Abortions TAB SAB Ect Mult Living                 Review of Systems  Constitutional: Negative for fever and chills.  HENT: Negative for sore throat.   Eyes: Negative for visual disturbance.  Respiratory: Negative for cough and shortness of breath.   Cardiovascular: Negative for chest pain and leg swelling.  Gastrointestinal: Positive for nausea and abdominal pain. Negative for vomiting and diarrhea.  Genitourinary: Positive for flank pain. Negative for dysuria, vaginal discharge, difficulty urinating and  pelvic pain.  Musculoskeletal: Negative for myalgias.  Skin: Negative for rash.  Neurological: Negative for weakness, numbness and headaches.      Allergies  Penicillins  Home Medications   Prior to Admission medications   Medication Sig Start Date End Date Taking? Authorizing Provider  acetaminophen (TYLENOL) 500 MG tablet Take 1,000 mg by mouth every 6 (six) hours as needed. For pain    Historical Provider, MD   BP 123/72  Pulse 103  Temp(Src) 99.4 F (37.4 C) (Oral)  Resp 18  SpO2 98%  LMP 08/20/2013 Physical Exam  Nursing note and vitals reviewed. Constitutional: She is oriented to person, place, and time. She appears well-developed and well-nourished. No distress.  HENT:  Head: Normocephalic and atraumatic.  Mouth/Throat: Oropharynx is clear and moist. No oropharyngeal exudate.  Eyes: Conjunctivae are normal.  Neck: Neck supple. No thyromegaly present.  Cardiovascular: Normal rate, regular rhythm and intact distal pulses.   Pulmonary/Chest: Effort normal and breath sounds normal. No respiratory distress. She has no wheezes. She has no rales. She exhibits no tenderness.  Abdominal: Soft. Normal appearance and bowel sounds are normal. She exhibits no distension. There is no hepatosplenomegaly. There is tenderness. There is CVA tenderness. There is no rebound.  CVA tenderness noted on Rt side  Genitourinary: Uterus normal. Pelvic exam was performed with patient supine. There is no tenderness on the right labia. There  is no tenderness on the left labia. Vaginal discharge found.  Brownish discharge noted in vaginal vault and from cervix.  Pt reports finishing monthly cycle.  Removed with Fox swab  Musculoskeletal: She exhibits no tenderness.  Lymphadenopathy:    She has no cervical adenopathy.  Neurological: She is alert and oriented to person, place, and time.  Skin: Skin is warm and dry. No rash noted. She is not diaphoretic.  Psychiatric: She has a normal mood and  affect.    ED Course  Procedures (including critical care time) Labs Review Labs Reviewed  WET PREP, GENITAL - Abnormal; Notable for the following:    Clue Cells Wet Prep HPF POC MANY (*)    WBC, Wet Prep HPF POC FEW (*)    All other components within normal limits  CBC WITH DIFFERENTIAL - Abnormal; Notable for the following:    WBC 14.0 (*)    Neutrophils Relative % 78 (*)    Neutro Abs 10.8 (*)    All other components within normal limits  COMPREHENSIVE METABOLIC PANEL - Abnormal; Notable for the following:    Sodium 135 (*)    Chloride 94 (*)    Glucose, Bld 485 (*)    Anion gap 18 (*)    All other components within normal limits  URINALYSIS, ROUTINE W REFLEX MICROSCOPIC - Abnormal; Notable for the following:    APPearance CLOUDY (*)    Glucose, UA >1000 (*)    Hgb urine dipstick MODERATE (*)    Ketones, ur 15 (*)    Protein, ur 30 (*)    Leukocytes, UA MODERATE (*)    All other components within normal limits  LIPASE, BLOOD - Abnormal; Notable for the following:    Lipase 10 (*)    All other components within normal limits  CBG MONITORING, ED - Abnormal; Notable for the following:    Glucose-Capillary 409 (*)    All other components within normal limits  CBG MONITORING, ED - Abnormal; Notable for the following:    Glucose-Capillary 382 (*)    All other components within normal limits  GC/CHLAMYDIA PROBE AMP  URINE CULTURE  PREGNANCY, URINE  URINE MICROSCOPIC-ADD ON   10:15 PM Abd re-examined, pt denies tenderness on palpation, including rt lower quadrant.    Imaging Review Ct Abdomen Pelvis W Contrast  10/04/2013   CLINICAL DATA:  Right lower abdominal pain.  EXAM: CT ABDOMEN AND PELVIS WITH CONTRAST  TECHNIQUE: Multidetector CT imaging of the abdomen and pelvis was performed using the standard protocol following bolus administration of intravenous contrast.  CONTRAST:  68mL OMNIPAQUE IOHEXOL 300 MG/ML  SOLN  COMPARISON:  02/23/2009  FINDINGS: The liver, spleen,  pancreas, and adrenal glands appear unremarkable. Gallbladder surgically absent. Abnormal right perirenal stranding observed with accentuated enhancement along the collecting system wall and proximal ureteral wall. The right ureter is mildly dilated to the level of the iliac cross over work may be mildly compressed between the iliac vessels and the uterus. The distal ureter does not appear dilated.  Duplicated left collecting system.  There is fullness of the base of the appendix as on image 56 of series 2 and image 30 of series 7, although the rest of the appendix looks normal.  Urinary bladder unremarkable. Uterus is mildly prominent in length at 12.3 cm. Borderline right foraminal stenosis at L5-S1 due to facet and intervertebral spurring along with underlying disc bulge.  IMPRESSION: 1. Mild right perirenal stranding with accentuated wall enhancement in the collecting system  and proximal ureter on the right, suspicious for inflammatory process. Very faintly heterogeneous right renal enhancement. This could reflect early pyelonephritis. 2. There is some fullness at the base the appendix without compelling findings of appendicitis. 3. Mildly prominent uterus at 12.3 cm in length. 4. Duplicated left renal collecting system, without hydronephrosis or left renal scarring.   Electronically Signed   By: Sherryl Barters M.D.   On: 10/04/2013 22:30     EKG Interpretation None      MDM   Final diagnoses:  Pyelonephritis  Hyperglycemia  Type 2 diabetes mellitus with complication   Pt presents with RLQ abd pain x 2 days, worse last night.  Differential diagnosis includes appendicitis, renal calculi, UTI, pyelonephritis, and STI.  CBC, CMP, Lipase, UA/preg ordered from triage.  Pt's WBC: 14.0, Glucose: 485.  Pt received 1 L NS, and Dilaudid.  CT abd/pelvis done and results show pyelonephritis.   Pelvic exam and GC/Chlamydia and Wet prep done which show many clue cells.  Cath urine result is positive for  leukocytes, glucose, ketones, protein and Hgb.  A urine culture is ordered.  Rocephin IV for CT and urine findings. Insulin gtt with glucose stabilizer initiated, pt reports not taking her metformin for greater than 2 weeks.  Pt's repeat abd exam reveals no tenderness on palpation.  Pt's blood sugar reached 305 on re-check and pt was awake, it appears pt is safe to be discharged home as I doubt this is appendicitis, and her pain and elevated wbc are related to the CT and urine findings of pyelonephritis. She was given return precautions and follow up provider information and the d/c meds listed below.       Meds given in ED:  Medications  iohexol (OMNIPAQUE) 300 MG/ML solution 25 mL (25 mLs Oral Contrast Given 10/04/13 2040)  insulin regular (NOVOLIN R,HUMULIN R) 1 Units/mL in sodium chloride 0.9 % 100 mL infusion ( Intravenous Stopped 10/05/13 0106)  oxyCODONE-acetaminophen (PERCOCET/ROXICET) 5-325 MG per tablet 1 tablet (1 tablet Oral Given 10/04/13 1942)  ondansetron (ZOFRAN-ODT) disintegrating tablet 8 mg (8 mg Oral Given 10/04/13 1941)  sodium chloride 0.9 % bolus 1,000 mL (0 mLs Intravenous Stopped 10/04/13 2255)  HYDROmorphone (DILAUDID) injection 1 mg (1 mg Intravenous Given 10/04/13 2056)  iohexol (OMNIPAQUE) 300 MG/ML solution 80 mL (80 mLs Intravenous Contrast Given 10/04/13 2156)  cefTRIAXone (ROCEPHIN) 1 g in dextrose 5 % 50 mL IVPB (0 g Intravenous Stopped 10/04/13 2329)    New Prescriptions   CIPROFLOXACIN (CIPRO) 500 MG TABLET    Take 1 tablet (500 mg total) by mouth 2 (two) times daily.   METFORMIN (GLUCOPHAGE) 500 MG TABLET    Take 1 tablet (500 mg total) by mouth 2 (two) times daily with a meal.   METRONIDAZOLE (FLAGYL) 500 MG TABLET    Take 1 tablet (500 mg total) by mouth 2 (two) times daily.   ONDANSETRON (ZOFRAN) 4 MG TABLET    Take 1 tablet (4 mg total) by mouth every 6 (six) hours.   OXYCODONE-ACETAMINOPHEN (PERCOCET/ROXICET) 5-325 MG PER TABLET    Take 1-2 tablets by mouth every  4 (four) hours as needed for moderate pain or severe pain.       Britt Bottom, NP 10/05/13 [phone]

## 2013-10-04 NOTE — ED Notes (Signed)
PA aware of patient refusal

## 2013-10-04 NOTE — ED Notes (Signed)
Raquel Sarna, PA aware of desat after narcotics

## 2013-10-04 NOTE — ED Notes (Signed)
Attempted to draw blood from site; Pt refusing to be stuck again for labs

## 2013-10-04 NOTE — ED Notes (Signed)
Pt placed on 2L oxygen via [place] for sats at 82% after Dilaudid; oxygen sats at 98% with 2L O2

## 2013-10-04 NOTE — ED Notes (Signed)
NP at bedside.

## 2013-10-05 LAB — CBG MONITORING, ED: Glucose-Capillary: 305 mg/dL — ABNORMAL HIGH (ref 70–99)

## 2013-10-05 MED ORDER — METRONIDAZOLE 500 MG PO TABS: 500.0000 mg | ORAL_TABLET | Freq: Two times a day (BID) | ORAL | Status: DC

## 2013-10-05 MED ORDER — CIPROFLOXACIN HCL 500 MG PO TABS: 500.0000 mg | ORAL_TABLET | Freq: Two times a day (BID) | ORAL | Status: DC

## 2013-10-05 MED ORDER — OXYCODONE-ACETAMINOPHEN 5-325 MG PO TABS: 1.0000 | ORAL_TABLET | ORAL | Status: DC | PRN

## 2013-10-05 MED ORDER — METFORMIN HCL 500 MG PO TABS: 500.0000 mg | ORAL_TABLET | Freq: Two times a day (BID) | ORAL | Status: DC

## 2013-10-05 MED ORDER — ONDANSETRON HCL 4 MG PO TABS: 4.0000 mg | ORAL_TABLET | Freq: Four times a day (QID) | ORAL | Status: DC

## 2013-10-05 NOTE — ED Provider Notes (Signed)
Medical screening examination/treatment/procedure(s) were performed by non-physician practitioner and as supervising physician I was immediately available for consultation/collaboration.   EKG Interpretation None       Threasa Beards, MD 10/05/13 1517

## 2013-10-05 NOTE — ED Notes (Signed)
NP aware of CBG; states to d/c insulin drip to prepare for discharge

## 2013-10-05 NOTE — Discharge Instructions (Signed)
Be sure to find a primary care provider form the list below.  It is very important you follow up with a primary care provider to manage your diabetes and to ensure this infection in your kidney and urinary tract is improving.  Because you received contrast dye tonight with your CT, hold your metformin for 48 hours.    Abdominal (belly) pain can be caused by many things. Your caregiver performed an examination and possibly ordered blood/urine tests and imaging (CT scan, x-rays, ultrasound). Many cases can be observed and treated at home after initial evaluation in the emergency department. Even though you are being discharged home, abdominal pain can be unpredictable. Therefore, you need a repeated exam if your pain does not resolve, returns, or worsens. Most patients with abdominal pain don't have to be admitted to the hospital or have surgery, but serious problems like appendicitis and gallbladder attacks can start out as nonspecific pain. Many abdominal conditions cannot be diagnosed in one visit, so follow-up evaluations are very important.  SEEK IMMEDIATE MEDICAL ATTENTION IF: The pain does not go away or becomes severe.  A temperature above 101 develops.  Repeated vomiting occurs (multiple episodes).  The pain becomes localized to portions of the abdomen. The right side could possibly be appendicitis. In an adult, the left lower portion of the abdomen could be colitis or diverticulitis.  Blood is being passed in stools or vomit (bright red or black tarry stools).  Return also if you develop chest pain, difficulty breathing, dizziness or fainting, or become confused, poorly responsive.    Emergency Department Resource Guide 1) Find a Doctor and Pay Out of Pocket Although you won't have to find out who is covered by your insurance plan, it is a good idea to ask around and get recommendations. You will then need to call the office and see if the doctor you have chosen will accept you as a new  patient and what types of options they offer for patients who are self-pay. Some doctors offer discounts or will set up payment plans for their patients who do not have insurance, but you will need to ask so you aren't surprised when you get to your appointment.  2) Contact Your Local Health Department Not all health departments have doctors that can see patients for sick visits, but many do, so it is worth a call to see if yours does. If you don't know where your local health department is, you can check in your phone book. The CDC also has a tool to help you locate your state's health department, and many state websites also have listings of all of their local health departments.  3) Find a [facility] If your illness is not likely to be very severe or complicated, you may want to try a walk in clinic. These are popping up all over the country in pharmacies, drugstores, and shopping centers. They're usually staffed by nurse practitioners or physician assistants that have been trained to treat common illnesses and complaints. They're usually fairly quick and inexpensive. However, if you have serious medical issues or chronic medical problems, these are probably not your best option.  No Primary Care Doctor: - Call Health Connect at  [phone] - they can help you locate a primary care doctor that  accepts your insurance, provides certain services, etc. - Physician Referral Service- [phone]  Chronic Pain Problems: Organization         Address  Phone   Notes  [facility]  [phone] Patients need to be referred by their primary care doctor.   Medication Assistance: Organization         Address  Phone   Notes  [facility] Medication [facility] [place], [place] [address] [phone] --Must be a resident of [facility] -- Must have NO insurance coverage whatsoever (no Medicaid/ Medicare, etc.) -- The pt. MUST have a primary  care doctor that directs their care regularly and follows them in the community   MedAssist  [phone]   [employer]  [phone]    Agencies that provide inexpensive medical care: Organization         Address  Phone   Notes  [place]  [phone]   [facility]    [phone]   [facility] [place], [place] [address] [phone]   [facility] [address] [place] [phone]   Planned Parenthood    [phone]   [facility]    [phone]   [place] and [place] [address], [place] Phone:  [phone], Fax:  [phone] Hours of Operation:  9 am - 6 pm, M-F.  Also accepts Medicaid/Medicare and self-pay.  [facility] for [place] [address] [place], [address], [place] Phone: [phone], Fax: [phone]. Hours of Operation:  8:30 am - 5:30 pm, M-F.  Also accepts Medicaid and self-pay.  [facility] [place] [address], [place] Phone: [phone]   [place], [place], [place] [phone], Ext. [phone] Mondays & Thursdays: 7-9 AM.  First 15 patients are seen on a first come, first serve basis.    [place] Providers:  Organization         Address  Phone   Notes  [facility] [address], [place] [phone] Also accepts self-pay patients.  [facility] [id number] [place], [place]  [phone]   [place], [address], [place] [phone]   [facility] [address] [place] [phone]   Lucianne Lei [address], [place]   [phone] Only accepts [place] Access [place] patients after they have their name applied to their card.   Self-Pay (no insurance) in [facility]:  Organization         Address  Phone   Notes  Sickle Cell Patients, [facility] [place] [phone]   [facility] [place] [phone]   [facility] [place]  [place], [place],  [phone]   [facility]/Dr. Osei-Bonsu  [address], [place] or [address], [place] [phone] Phone number for both [place] and [place] locations is the same.  Urgent Medical and [facility] [address], [place] [phone]   [facility] [address] [place] or [address] [phone]   [facility] [address] [place] [phone], phone; [phone], fax Sees patients 1st and 3rd Saturday of every month.  Must not qualify for public or private insurance (i.e. Medicaid, Medicare, [place] Health Choice,  Veterans' Benefits)  Household income should be no more than 200% of the poverty level The clinic cannot treat you if you are pregnant or think you are pregnant  Sexually transmitted diseases are not treated at the clinic.    Dental Care: Organization         Address  Phone  Notes  [facility] Department of [facility] [place] [phone] Accepts children up to age 12 who are enrolled in [place] or [place]; pregnant women with a Medicaid card; and children who have applied for Medicaid or [place] Health Choice, but were declined, whose parents can pay a reduced fee at time of service.  [facility] Department of [facility]  [address], [place] [phone] Accepts children up to age 70 who are enrolled in [place] or [place]; pregnant women with a Medicaid card; and children who have applied for Medicaid or [place] Health Choice, but were declined, whose parents can  pay a reduced fee at time of service.  [facility] Adult Dental Access PROGRAM  [place] [phone] Patients are seen by appointment only. Walk-ins are not accepted. [place] will see patients 34 years of age and older. Monday - Tuesday (8am-5pm) Most Wednesdays (8:30-5pm) $30 per visit, cash only  [facility] Adult Dental Access PROGRAM  [address], [facility] [phone] Patients are seen by appointment only. Walk-ins are not accepted. [place] will see patients 40 years of age and older. One Wednesday Evening (Monthly: Volunteer Based).  $30 per visit, cash only  [place]  [phone] for adults; Children under age 59, call Graduate Pediatric Dentistry at [phone]. Children aged 70-14, please call [phone] to request a pediatric application.  Dental services are provided in all areas of dental care including fillings, crowns and bridges, complete and partial dentures, implants, gum treatment, root canals, and extractions. Preventive care is also provided. Treatment is provided to both adults and children. Patients are selected via a lottery and there is often a waiting list.   [facility] [address], [place]  [phone] [email or web address]   [facility] [address] [place], [place] [phone], Ext. [phone] Second and Fourth Thursday of each month, opens at 6:30 AM; Clinic ends at 9 AM.  Patients are seen on a first-come first-served basis, and a limited number are seen during each clinic.   [facility]  [address] Hillard Danker [place], [place] [phone]   Eligibility Requirements You must have lived in [place], [place], or [place] counties for at least the last three months.   You cannot be eligible for state or federal sponsored [employer], including [employer], [place], or [employer].   You generally cannot be eligible for healthcare  insurance through your employer.    How to apply: Eligibility screenings are held every Tuesday and Wednesday afternoon from 1:00 pm until 4:00 pm. You do not need an appointment for the interview!  [facility] [address], [place], [place]   [place]  [place] Department  [place]  [phone]    Behavioral Health Resources in the Community: Intensive Outpatient Programs Organization         Address  Phone  Notes  [place] Martin. [place]  [address] [place], [place] [phone]   [facility] Outpatient [address] [place], [place]   ADS: Alcohol & Drug Svcs [address], [place], [place]   [place] [address]  [place], [place] or [phone]   Substance Abuse Resources Organization         Address  Phone  Notes  Alcohol and Drug Services  [phone]   [place]  [phone]   [place]   Chinita Pester  [phone]   Residential & Outpatient Substance Abuse Program  [phone]   Psychological Services Organization         Address  Phone  Notes  [facility] [place]  [place]  [phone]   [place] [address] [place] or [phone]    Mobile Crisis Teams Organization         Address  Phone  Notes  [facility], Mobile Crisis Care Unit  [phone]   [facility]  [address] [place], [place]   [place] [address] [place] [phone]    Self-Help/Support Groups Organization         Address  Phone             Notes  [place]. of [place] - variety of support groups  [place] Call for more information  Narcotics Anonymous (NA),  [facility] [address], [employer] [place]  2 meetings at this location   Special educational needs teacher         Address  Phone  Notes  [facility] [place],    [place]  [phone]   [facility]  [address] [place] [id number], [place], [place]   [place], [place] [phone] Admissions: 8am-3pm M-F  Incentives Substance [place] [address],    [place], [place] [phone]   [facility] [address] [place], [place], [place]   [facility] [address],  [place], [place]   [facility] Programs - Intensive Outpatient [address] Kristeen [address], [place], [place]   [facility] ([place] [place].,  [place], [place] [phone] or [phone]   [facility] (RTS) [address] [place], [place] Accepts Medicaid  [facility] [place] [address],  [place] 1-4090971807 Substance Abuse/Addiction Treatment   [facility] Organization         Address  Phone  Notes  [facility]  [phone]   Domenic Schwab, PhD [address] [place]lis [place], [place]   [phone] or [phone]   [facility] [place], [place] [phone]   [facility] [address], [place], [place] [phone] Insurance/Medicaid/sponsorship through [facility] and Families [address] [place]                                    [place], [place] [phone] [place] [address], [place] [phone]    Dr. Adele Schilder  [phone]   [facility] Dept. 1) [address] [place] 2)  [place] 3)  [place] [address], [place] [phone]  [phone]    [facility] Child Abuse Hotline [phone] or 4706038417 (After Hours)

## 2013-10-06 LAB — GC/CHLAMYDIA PROBE AMP
CT Probe RNA: NEGATIVE
GC Probe RNA: NEGATIVE

## 2013-10-07 LAB — URINE CULTURE: Colony Count: 55000

## 2013-10-08 ENCOUNTER — Telehealth (HOSPITAL_BASED_OUTPATIENT_CLINIC_OR_DEPARTMENT_OTHER): Payer: Self-pay | Admitting: Emergency Medicine

## 2013-10-08 NOTE — Telephone Encounter (Signed)
Post ED Visit - Positive Culture Follow-up  Culture report reviewed by antimicrobial stewardship pharmacist: []  Wes Dulaney, Pharm.D., BCPS []  Heide Guile, Pharm.D., BCPS []  Alycia Rossetti, Pharm.D., BCPS []  [place], Pharm.D., BCPS, AAHIVP []  Legrand [place], Pharm.D., BCPS, AAHIVP []  Hassie Bruce, Pharm.D. [x]  Milus Glazier, [place].D.  Positive urine culture >55,000 colonies/ml E. Coli Treated with Ciprofloxacin hcl 500 mg po tabs; take one tablet by mouth bid x 7 days, organism sensitive to the same and no further patient follow-up is required at this time.  Hazle Nordmann 10/08/2013, 10:31 AM

## 2013-11-28 ENCOUNTER — Encounter (HOSPITAL_COMMUNITY): Payer: Self-pay | Admitting: Emergency Medicine

## 2013-11-28 ENCOUNTER — Emergency Department (HOSPITAL_COMMUNITY)
Admission: EM | Admit: 2013-11-28 | Discharge: 2013-11-28 | Disposition: A | Discharge status: Home / Self Care | Payer: Self-pay | Attending: Emergency Medicine | Admitting: Emergency Medicine

## 2013-11-28 DIAGNOSIS — S4991XA Unspecified injury of right shoulder and upper arm, initial encounter: Secondary | ICD-10-CM | POA: Insufficient documentation

## 2013-11-28 DIAGNOSIS — Z87891 Personal history of nicotine dependence: Secondary | ICD-10-CM | POA: Insufficient documentation

## 2013-11-28 DIAGNOSIS — Z79899 Other long term (current) drug therapy: Secondary | ICD-10-CM | POA: Insufficient documentation

## 2013-11-28 DIAGNOSIS — E119 Type 2 diabetes mellitus without complications: Secondary | ICD-10-CM | POA: Insufficient documentation

## 2013-11-28 DIAGNOSIS — Z880 Allergy status to penicillin: Secondary | ICD-10-CM | POA: Insufficient documentation

## 2013-11-28 DIAGNOSIS — M791 Myalgia, unspecified site: Secondary | ICD-10-CM

## 2013-11-28 DIAGNOSIS — S40812A Abrasion of left upper arm, initial encounter: Secondary | ICD-10-CM | POA: Insufficient documentation

## 2013-11-28 DIAGNOSIS — S8992XA Unspecified injury of left lower leg, initial encounter: Secondary | ICD-10-CM | POA: Insufficient documentation

## 2013-11-28 DIAGNOSIS — E669 Obesity, unspecified: Secondary | ICD-10-CM | POA: Insufficient documentation

## 2013-11-28 DIAGNOSIS — S8991XA Unspecified injury of right lower leg, initial encounter: Secondary | ICD-10-CM | POA: Insufficient documentation

## 2013-11-28 DIAGNOSIS — S4992XA Unspecified injury of left shoulder and upper arm, initial encounter: Secondary | ICD-10-CM | POA: Insufficient documentation

## 2013-11-28 DIAGNOSIS — J45909 Unspecified asthma, uncomplicated: Secondary | ICD-10-CM | POA: Insufficient documentation

## 2013-11-28 DIAGNOSIS — Y9389 Activity, other specified: Secondary | ICD-10-CM | POA: Insufficient documentation

## 2013-11-28 DIAGNOSIS — S3992XA Unspecified injury of lower back, initial encounter: Secondary | ICD-10-CM | POA: Insufficient documentation

## 2013-11-28 DIAGNOSIS — Y92410 Unspecified street and highway as the place of occurrence of the external cause: Secondary | ICD-10-CM | POA: Insufficient documentation

## 2013-11-28 MED ORDER — SUCCINYLCHOLINE CHLORIDE 20 MG/ML IJ SOLN
INTRAMUSCULAR | Status: AC
  Filled 2013-11-28: qty 1

## 2013-11-28 MED ORDER — ROCURONIUM BROMIDE 50 MG/5ML IV SOLN
INTRAVENOUS | Status: AC
  Filled 2013-11-28: qty 2

## 2013-11-28 MED ORDER — IBUPROFEN 800 MG PO TABS: 800.0000 mg | ORAL_TABLET | Freq: Three times a day (TID) | ORAL | Status: DC

## 2013-11-28 MED ORDER — ETOMIDATE 2 MG/ML IV SOLN
INTRAVENOUS | Status: AC
  Filled 2013-11-28: qty 20

## 2013-11-28 MED ORDER — DIAZEPAM 5 MG PO TABS: 5.0000 mg | ORAL_TABLET | Freq: Two times a day (BID) | ORAL | Status: DC

## 2013-11-28 MED ORDER — LIDOCAINE HCL (CARDIAC) 20 MG/ML IV SOLN
INTRAVENOUS | Status: AC
  Filled 2013-11-28: qty 5
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
## 2013-11-28 NOTE — ED Notes (Signed)
Declined W/C at D/C and was escorted to lobby by RN. 

## 2013-11-28 NOTE — ED Provider Notes (Signed)
Medical screening examination/treatment/procedure(s) were performed by non-physician practitioner and as supervising physician I was immediately available for consultation/collaboration.  Richarda Blade, MD 11/28/13 [id number]

## 2013-12-13 ENCOUNTER — Encounter (HOSPITAL_COMMUNITY): Payer: Self-pay | Admitting: Emergency Medicine

## 2013-12-13 ENCOUNTER — Emergency Department (HOSPITAL_COMMUNITY)
Admission: EM | Admit: 2013-12-13 | Discharge: 2013-12-13 | Disposition: A | Discharge status: Home / Self Care | Payer: Self-pay | Attending: Emergency Medicine | Admitting: Emergency Medicine

## 2013-12-13 ENCOUNTER — Emergency Department (HOSPITAL_COMMUNITY): Payer: No Typology Code available for payment source

## 2013-12-13 DIAGNOSIS — Z79899 Other long term (current) drug therapy: Secondary | ICD-10-CM | POA: Insufficient documentation

## 2013-12-13 DIAGNOSIS — Z87891 Personal history of nicotine dependence: Secondary | ICD-10-CM | POA: Insufficient documentation

## 2013-12-13 DIAGNOSIS — Y9301 Activity, walking, marching and hiking: Secondary | ICD-10-CM | POA: Insufficient documentation

## 2013-12-13 DIAGNOSIS — J45909 Unspecified asthma, uncomplicated: Secondary | ICD-10-CM | POA: Insufficient documentation

## 2013-12-13 DIAGNOSIS — X58XXXA Exposure to other specified factors, initial encounter: Secondary | ICD-10-CM | POA: Insufficient documentation

## 2013-12-13 DIAGNOSIS — Y9289 Other specified places as the place of occurrence of the external cause: Secondary | ICD-10-CM | POA: Insufficient documentation

## 2013-12-13 DIAGNOSIS — S8992XA Unspecified injury of left lower leg, initial encounter: Secondary | ICD-10-CM | POA: Insufficient documentation

## 2013-12-13 DIAGNOSIS — Z880 Allergy status to penicillin: Secondary | ICD-10-CM | POA: Insufficient documentation

## 2013-12-13 DIAGNOSIS — E119 Type 2 diabetes mellitus without complications: Secondary | ICD-10-CM | POA: Insufficient documentation

## 2013-12-13 MED ORDER — IBUPROFEN 400 MG PO TABS
800.0000 mg | ORAL_TABLET | Freq: Once | ORAL | Status: AC
  Administered 2013-12-13: 800 mg via ORAL
  Filled 2013-12-13: qty 2

## 2013-12-13 MED ORDER — IBUPROFEN 800 MG PO TABS: 800.0000 mg | ORAL_TABLET | Freq: Three times a day (TID) | ORAL | Status: DC

## 2013-12-13 NOTE — ED Notes (Signed)
Patient transported to X-ray 
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
## 2013-12-13 NOTE — Discharge Instructions (Signed)
Please follow up with orthopedic specialist for further management of your knee injury.  No broken bone on xray  Elastic Bandage and RICE Elastic bandages come in different shapes and sizes. They perform different functions. Your caregiver will help you to decide what is best for your protection, recovery, or rehabilitation following an injury. The following are some general tips to help you use an elastic bandage.  Use the bandage as directed by the maker of the bandage you are using.  Do not wrap it too tight. This may cut off the circulation of the arm or leg below the bandage.  If part of your body beyond the bandage becomes blue, numb, or swollen, it is too tight. Loosen the bandage as needed to prevent these problems.  See your caregiver or trainer if the bandage seems to be making your problems worse rather than better. Bandages may be a reminder to you that you have an injury. However, they provide very little support. The few pounds of support they provide are minor considering the pressure it takes to injure a joint or tear ligaments. Therefore, the joint will not be able to handle all of the wear and tear it could before the injury. The routine care of many injuries includes Rest, Ice, Compression, and Elevation (RICE).  Rest is required to allow your body to heal. Generally, routine activities can be resumed when comfortable. Injured tendons and bones take about 6 weeks to heal.  Icing the injury helps keep the swelling down and reduces pain. Do not apply ice directly to the skin. Put ice in a plastic bag. Place a towel between the skin and the bag. This will prevent frostbite to the skin. Apply ice bags to the injured area for 15-20 minutes, every 2 hours while awake. Do this for the first 24 to 48 hours, then as directed by your caregiver.  Compression helps keep swelling down, gives support, and helps with discomfort. If an elastic bandage has been applied today, it should be  removed and reapplied every 3 to 4 hours. It should not be applied tightly, but firmly enough to keep swelling down. Watch fingers or toes for swelling, bluish discoloration, coldness, numbness, or increased pain. If any of these problems occur, remove the bandage and reapply it more loosely. If these problems persist, contact your caregiver.  Elevation helps reduce swelling and decreases pain. The injured area (arms, hands, legs, or feet) should be placed near to or above the heart (center of the chest) if able. Persistent pain and inability to use the injured area for more than 2 to 3 days are warning signs. You should see a caregiver for a follow-up visit as soon as possible. Initially, a minor broken bone (hairline fracture) may not be seen on X-rays. It may take 7 to 10 days to finally show up. Continued pain and swelling show that further evaluation and/or X-rays are needed. Make a follow-up visit with your caregiver. A specialist in reading X-rays (radiologist) will read your X-rays again. Finding out the results of your test Not all test results are available during your visit. If your test results are not back during the visit, make an appointment with your caregiver to find out the results. Do not assume everything is normal if you have not heard from your caregiver or the medical facility. It is important for you to follow up on all of your test results. Document Released: 08/03/2001 Document Revised: 05/06/2011 Document Reviewed: 06/15/2007 [employer] Patient Information 2015  [employer]. This information is not intended to replace advice given to you by your health care provider. Make sure you discuss any questions you have with your health care provider.
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
## 2013-12-14 NOTE — ED Provider Notes (Signed)
Medical screening examination/treatment/procedure(s) were performed by non-physician practitioner and as supervising physician I was immediately available for consultation/collaboration.   EKG Interpretation None        Francine Graven, DO 12/14/13 2316

## 2014-03-08 ENCOUNTER — Emergency Department (HOSPITAL_COMMUNITY): Payer: Self-pay

## 2014-03-08 ENCOUNTER — Emergency Department (HOSPITAL_COMMUNITY)
Admission: EM | Admit: 2014-03-08 | Discharge: 2014-03-08 | Disposition: A | Discharge status: Home / Self Care | Payer: Self-pay | Attending: Emergency Medicine | Admitting: Emergency Medicine

## 2014-03-08 DIAGNOSIS — R000 Tachycardia, unspecified: Secondary | ICD-10-CM | POA: Insufficient documentation

## 2014-03-08 DIAGNOSIS — Z79899 Other long term (current) drug therapy: Secondary | ICD-10-CM | POA: Insufficient documentation

## 2014-03-08 DIAGNOSIS — K047 Periapical abscess without sinus: Secondary | ICD-10-CM | POA: Diagnosis present

## 2014-03-08 DIAGNOSIS — J399 Disease of upper respiratory tract, unspecified: Secondary | ICD-10-CM | POA: Diagnosis present

## 2014-03-08 DIAGNOSIS — Z791 Long term (current) use of non-steroidal anti-inflammatories (NSAID): Secondary | ICD-10-CM | POA: Insufficient documentation

## 2014-03-08 DIAGNOSIS — J45901 Unspecified asthma with (acute) exacerbation: Secondary | ICD-10-CM | POA: Insufficient documentation

## 2014-03-08 DIAGNOSIS — E119 Type 2 diabetes mellitus without complications: Secondary | ICD-10-CM | POA: Insufficient documentation

## 2014-03-08 DIAGNOSIS — I10 Essential (primary) hypertension: Secondary | ICD-10-CM | POA: Insufficient documentation

## 2014-03-08 DIAGNOSIS — Z87891 Personal history of nicotine dependence: Secondary | ICD-10-CM | POA: Insufficient documentation

## 2014-03-08 DIAGNOSIS — Z880 Allergy status to penicillin: Secondary | ICD-10-CM | POA: Insufficient documentation

## 2014-03-08 DIAGNOSIS — R4182 Altered mental status, unspecified: Secondary | ICD-10-CM

## 2014-03-08 DIAGNOSIS — Z3202 Encounter for pregnancy test, result negative: Secondary | ICD-10-CM | POA: Insufficient documentation

## 2014-03-08 DIAGNOSIS — F419 Anxiety disorder, unspecified: Secondary | ICD-10-CM | POA: Insufficient documentation

## 2014-03-08 LAB — URINALYSIS, ROUTINE W REFLEX MICROSCOPIC
Glucose, UA: NEGATIVE mg/dL
Hgb urine dipstick: NEGATIVE
KETONES UR: 15 mg/dL — AB
NITRITE: NEGATIVE
PH: 6 (ref 5.0–8.0)
PROTEIN: 30 mg/dL — AB
Specific Gravity, Urine: 1.019 (ref 1.005–1.030)
Urobilinogen, UA: 0.2 mg/dL (ref 0.0–1.0)

## 2014-03-08 LAB — CBC WITH DIFFERENTIAL/PLATELET
BASOS ABS: 0 10*3/uL (ref 0.0–0.1)
Basophils Relative: 0 % (ref 0–1)
Eosinophils Absolute: 0.1 10*3/uL (ref 0.0–0.7)
Eosinophils Relative: 1 % (ref 0–5)
HCT: 38 % (ref 36.0–46.0)
HEMOGLOBIN: 13.6 g/dL (ref 12.0–15.0)
LYMPHS PCT: 20 % (ref 12–46)
Lymphs Abs: 2 10*3/uL (ref 0.7–4.0)
MCH: 31.4 pg (ref 26.0–34.0)
MCHC: 35.8 g/dL (ref 30.0–36.0)
MCV: 87.8 fL (ref 78.0–100.0)
MONOS PCT: 9 % (ref 3–12)
Monocytes Absolute: 0.9 10*3/uL (ref 0.1–1.0)
NEUTROS ABS: 7 10*3/uL (ref 1.7–7.7)
NEUTROS PCT: 70 % (ref 43–77)
Platelets: 343 10*3/uL (ref 150–400)
RBC: 4.33 MIL/uL (ref 3.87–5.11)
RDW: 12.2 % (ref 11.5–15.5)
WBC: 9.9 10*3/uL (ref 4.0–10.5)

## 2014-03-08 LAB — I-STAT ARTERIAL BLOOD GAS, ED
Bicarbonate: 24.2 mEq/L — ABNORMAL HIGH (ref 20.0–24.0)
O2 Saturation: 97 %
PCO2 ART: 35.8 mmHg (ref 35.0–45.0)
PH ART: 7.437 (ref 7.350–7.450)
Patient temperature: 98.6
TCO2: 25 mmol/L (ref 0–100)
pO2, Arterial: 82 mmHg (ref 80.0–100.0)

## 2014-03-08 LAB — COMPREHENSIVE METABOLIC PANEL
ALT: 14 U/L (ref 0–35)
AST: 31 U/L (ref 0–37)
Albumin: 3.8 g/dL (ref 3.5–5.2)
Alkaline Phosphatase: 83 U/L (ref 39–117)
Anion gap: 11 (ref 5–15)
BUN: 8 mg/dL (ref 6–23)
CALCIUM: 8.9 mg/dL (ref 8.4–10.5)
CO2: 23 mmol/L (ref 19–32)
CREATININE: 0.76 mg/dL (ref 0.50–1.10)
Chloride: 96 mEq/L (ref 96–112)
Glucose, Bld: 400 mg/dL — ABNORMAL HIGH (ref 70–99)
Potassium: 4.3 mmol/L (ref 3.5–5.1)
Sodium: 130 mmol/L — ABNORMAL LOW (ref 135–145)
Total Bilirubin: 1.9 mg/dL — ABNORMAL HIGH (ref 0.3–1.2)
Total Protein: 7.9 g/dL (ref 6.0–8.3)

## 2014-03-08 LAB — RAPID URINE DRUG SCREEN, HOSP PERFORMED
AMPHETAMINES: NOT DETECTED
BARBITURATES: NOT DETECTED
BENZODIAZEPINES: NOT DETECTED
COCAINE: NOT DETECTED
OPIATES: NOT DETECTED
TETRAHYDROCANNABINOL: POSITIVE — AB

## 2014-03-08 LAB — URINE MICROSCOPIC-ADD ON

## 2014-03-08 LAB — I-STAT CG4 LACTIC ACID, ED: LACTIC ACID, VENOUS: 2.15 mmol/L (ref 0.5–2.2)

## 2014-03-08 LAB — AMMONIA: Ammonia: 20 umol/L (ref 11–32)

## 2014-03-08 LAB — CBG MONITORING, ED
GLUCOSE-CAPILLARY: 399 mg/dL — AB (ref 70–99)
Glucose-Capillary: 246 mg/dL — ABNORMAL HIGH (ref 70–99)

## 2014-03-08 LAB — TROPONIN I

## 2014-03-08 LAB — POC URINE PREG, ED: Preg Test, Ur: NEGATIVE

## 2014-03-08 LAB — TSH: TSH: 0.861 u[IU]/mL (ref 0.350–4.500)

## 2014-03-08 MED ORDER — AMOXICILLIN-POT CLAVULANATE 875-125 MG PO TABS: 1.0000 | ORAL_TABLET | Freq: Two times a day (BID) | ORAL | Status: DC

## 2014-03-08 MED ORDER — SODIUM CHLORIDE 0.9 % IV BOLUS (SEPSIS)
1000.0000 mL | Freq: Once | INTRAVENOUS | Status: AC
  Administered 2014-03-08: 1000 mL via INTRAVENOUS

## 2014-03-08 MED ORDER — NALOXONE HCL 0.4 MG/ML IJ SOLN
0.4000 mg | Freq: Once | INTRAMUSCULAR | Status: AC
  Administered 2014-03-08: 0.4 mg via INTRAVENOUS
  Filled 2014-03-08: qty 1

## 2014-03-08 MED ORDER — IBUPROFEN 400 MG PO TABS
600.0000 mg | ORAL_TABLET | Freq: Once | ORAL | Status: AC
  Administered 2014-03-08: 600 mg via ORAL
  Filled 2014-03-08 (×2): qty 1

## 2014-03-08 MED ORDER — AMOXICILLIN-POT CLAVULANATE 875-125 MG PO TABS
1.0000 | ORAL_TABLET | Freq: Once | ORAL | Status: AC
  Administered 2014-03-08: 1 via ORAL
  Filled 2014-03-08: qty 1

## 2014-03-08 MED ORDER — IPRATROPIUM-ALBUTEROL 0.5-2.5 (3) MG/3ML IN SOLN
3.0000 mL | Freq: Once | RESPIRATORY_TRACT | Status: AC
  Administered 2014-03-08: 3 mL via RESPIRATORY_TRACT
  Filled 2014-03-08: qty 3

## 2014-03-08 MED ORDER — CLARITHROMYCIN 500 MG PO TABS: 500.0000 mg | ORAL_TABLET | Freq: Two times a day (BID) | ORAL | Status: DC

## 2014-03-08 MED ORDER — METFORMIN HCL 500 MG PO TABS: 500.0000 mg | ORAL_TABLET | Freq: Two times a day (BID) | ORAL | Status: DC

## 2014-03-08 MED ORDER — LORATADINE 10 MG PO TABS: 10.0000 mg | ORAL_TABLET | Freq: Every day | ORAL | Status: DC

## 2014-03-08 MED ORDER — ACETAMINOPHEN 325 MG PO TABS
325.0000 mg | ORAL_TABLET | Freq: Once | ORAL | Status: AC
  Administered 2014-03-08: 325 mg via ORAL
  Filled 2014-03-08: qty 1

## 2014-03-08 NOTE — ED Notes (Signed)
[name], Case Management RN providing medication from [facility] that was prescribed today by physicians & pt has appt  next Weds., pt verbalizes understanding of follow up care & medication regimen

## 2014-03-08 NOTE — ED Notes (Signed)
[name], CSW requested to visit patient.

## 2014-03-08 NOTE — ED Notes (Signed)
[name], CSW finished consult with patient.

## 2014-03-08 NOTE — Consult Note (Signed)
Date: 03/08/2014               Patient Name:  [name] MRN: [id number]  DOB: 01-Aug-1965 Age / Sex: 49 y.o., female   PCP: No Pcp Per Patient         Requesting Physician: Dr. Rayne Du att. providers found    Consulting Reason:  Evaluation for possible hospitalization v outpatient management     Chief Complaint:  Difficulty breathing, nasal congestion, teeth pain  History of Present Illness:  Ms. [name] is a 49 year old woman with PMH of DM2, HTN, intermittent mild asthma, seasonal allergies, and anxiety presenting to the [facility] after being brought by her father due to shortness of breath with chest tightness. In the ED she was thought to be lethargic and not cooperative to the exam with concern for altered mental status but remained hemodynamically stable with mild tachycardia (102-105). She had CT head and CXR that were negative for acute changes or significant findings. She had lactic acid, CBC, TSH, and ammonia level that were within normal limits. She also had ABG that was essentially normal (pH 7.437, pCO2 35.8, pO2 82, bicarb 24.2) and her oxygen saturation remained stable at 97-100% on RA. Her UA was unremarkable for UTI with ketone 15 with small leukocytes but only 0-2 WBC. Urine pregnancy test was negative. UDS was positive for THC only. Her CMP was remarkable for hyperglycemia of 400 with AG of 11, normal potassium and Cr at baseline. She received Tylenol 325mg  PO once, DuoNeb, Narcan once, and 1L NS bolus.  She was then evaluated by the IMTS team and found to be at her baseline mentation, in no distress but with complaint of shortness of breath due to nasal congestion and painful teeth due to poor dentition/tooth abscess.  She stated that her father brought her to the ED because she felt like she was having chest pain with difficulty breathing. The patient states that she was concerned she was having asthma problems as she could not breath due to nasal congestion but denied fever/chills,  sick contacts, wheezing.  She reports being very stressed lately due to financial concerns and health concerns. She was worried about not having money to see a PCP or to buy her metformin (she had been out of this medication for one month). She was also worried about the pain in her teeth which she has had for days now--she tried Oragel and Ibuprofen with no much improvement of her pain. In addition she was worried about her period starting yesterday after not having a period since November--she denies excessive bleeding.  She reports good social support from her father (who brought her to the ED), from her teenage daughter and from her adult son. She states that she is safe at home and her boyfriend treats her "very well". During this interview she denied chest pain, shortness of breath, diarrhea, dysuria, or confusion. She reported pain in her teeth and decreased food intake due to pain with chewing, had been eating soft/plain foods since Sunday. She also reports nasal congestion that makes it difficult for her to breath.    Meds: No current facility-administered medications for this encounter.   Current Outpatient Prescriptions  Medication Sig Dispense Refill  . ibuprofen (ADVIL,MOTRIN) 800 MG tablet Take 1 tablet (800 mg total) by mouth 3 (three) times daily. 21 tablet 0  . clarithromycin (BIAXIN) 500 MG tablet Take 1 tablet (500 mg total) by mouth 2 (two) times daily. 20 tablet 0  . loratadine (  CLARITIN) 10 MG tablet Take 1 tablet (10 mg total) by mouth daily. 30 tablet 0  . metFORMIN (GLUCOPHAGE) 500 MG tablet Take 1 tablet (500 mg total) by mouth 2 (two) times daily with a meal. 60 tablet 1    Allergies: Allergies as of 03/08/2014 - Review Complete 03/08/2014  Allergen Reaction Noted  . Penicillins Rash 04/26/2011   Past Medical History  Diagnosis Date  . Diabetes mellitus   . Asthma     Child   Past Surgical History  Procedure Laterality Date  . Cholecystectomy     Family  History  Problem Relation Age of Onset  . Diabetes Mother    History   Social History  . Marital Status: Divorced    Spouse Name: N/A    Number of Children: N/A  . Years of Education: N/A   Occupational History  . Not on file.   Social History Main Topics  . Smoking status: Former Smoker -- 0.00 packs/day  . Smokeless tobacco: Not on file     Comment: Smoked when she was a teenager.   . Alcohol Use: Yes  . Drug Use: No  . Sexual Activity: Yes   Other Topics Concern  . Not on file   Social History Narrative   Lives with 3 children in [place]. Does not work     Review of Systems: Review of Systems  Constitutional: Negative for fever, chills, weight loss, malaise/fatigue and diaphoresis.  HENT: Positive for congestion. Negative for ear pain, nosebleeds and sore throat.   Eyes: Negative for blurred vision, double vision, pain and redness.  Respiratory: Positive for shortness of breath. Negative for cough, hemoptysis, sputum production and wheezing.   Cardiovascular: Negative for chest pain, palpitations, orthopnea, claudication and leg swelling.  Gastrointestinal: Negative for nausea, vomiting, abdominal pain, diarrhea, constipation and blood in stool.  Genitourinary: Negative for dysuria, urgency, frequency and hematuria.  Musculoskeletal: Negative for myalgias and back pain.  Skin: Negative for itching and rash.  Neurological: Negative for dizziness, tremors, focal weakness, weakness and headaches.  Psychiatric/Behavioral: Negative for depression and suicidal ideas. The patient is nervous/anxious.      Physical Exam: Blood pressure 153/93, pulse 104, temperature 98.3 F (36.8 C), temperature source Oral, resp. rate 22, SpO2 100 %. Physical Exam  Nursing note and vitals reviewed. Constitutional: She is oriented to person, place, and time. She appears well-developed and well-nourished. No distress.  HENT:  Head: Normocephalic and atraumatic.  Mouth/Throat:  Oropharynx is clear and moist. No oropharyngeal exudate.  Boggy nasal turbinates bilaterally with clear discharge.  Poor dentition with lower teeth decay, her teeth are tender to percussion. Lower gingival erythema with small pustule but no fluctuance.   Eyes: Conjunctivae are normal. Right eye exhibits no discharge. Left eye exhibits no discharge. No scleral icterus.  Neck: Neck supple. No thyromegaly present.  Cardiovascular: Normal rate and regular rhythm.   Respiratory: Breath sounds normal. No stridor. No respiratory distress. She has no wheezes. She has no rales.  GI: Soft. Bowel sounds are normal. She exhibits no distension. There is no tenderness. There is no rebound and no guarding.  Musculoskeletal: She exhibits no edema.  Lymphadenopathy:    She has no cervical adenopathy.  Neurological: She is alert and oriented to person, place, and time.  Skin: Skin is warm and dry. No rash noted. She is not diaphoretic. No erythema.  Psychiatric:  Flat affect, tearful at times    Lab results: Basic Metabolic Panel:  Recent Labs  03/08/14  1325  NA 130*  K 4.3  CL 96  CO2 23  GLUCOSE 400*  BUN 8  CREATININE 0.76  CALCIUM 8.9   Liver Function Tests:  Recent Labs  03/08/14 1325  AST 31  ALT 14  ALKPHOS 83  BILITOT 1.9*  PROT 7.9  ALBUMIN 3.8    Recent Labs  03/08/14 1328  AMMONIA 20   CBC:  Recent Labs  03/08/14 1325  WBC 9.9  NEUTROABS 7.0  HGB 13.6  HCT 38.0  MCV 87.8  PLT 343   Cardiac Enzymes:  Recent Labs  03/08/14 1325  TROPONINI <0.03   CBG:  Recent Labs  03/08/14 1314 03/08/14 1739  GLUCAP 399* 246*   Thyroid Function Tests:  Recent Labs  03/08/14 1327  TSH 0.861   Urine Drug Screen: Drugs of Abuse     Component Value Date/Time   LABOPIA NONE DETECTED 03/08/2014 1345   COCAINSCRNUR NONE DETECTED 03/08/2014 1345   LABBENZ NONE DETECTED 03/08/2014 1345   AMPHETMU NONE DETECTED 03/08/2014 1345   THCU POSITIVE* 03/08/2014 1345     LABBARB NONE DETECTED 03/08/2014 1345    Urinalysis:  Recent Labs  03/08/14 1345  COLORURINE YELLOW  LABSPEC 1.019  PHURINE 6.0  GLUCOSEU NEGATIVE  HGBUR NEGATIVE  BILIRUBINUR SMALL*  KETONESUR 15*  PROTEINUR 30*  UROBILINOGEN 0.2  NITRITE NEGATIVE  LEUKOCYTESUR SMALL*   Imaging results:  Ct Head Wo Contrast  03/08/2014   CLINICAL DATA:  Altered mental status.  EXAM: CT HEAD WITHOUT CONTRAST  TECHNIQUE: Contiguous axial images were obtained from the base of the skull through the vertex without intravenous contrast.  COMPARISON:  None.  FINDINGS: No acute intracranial abnormality. Specifically, no hemorrhage, hydrocephalus, mass lesion, acute infarction, or significant intracranial injury. No acute calvarial abnormality.  Mucosal thickening in the paranasal sinuses. No air-fluid levels. Mastoid air cells are clear.  IMPRESSION: No acute intracranial abnormality.   Electronically Signed   By: Rolm Baptise M.D.   On: 03/08/2014 14:56   Dg Chest Port 1 View  03/08/2014   CLINICAL DATA:  Chest pain.  Weakness.  Altered mental status.  EXAM: PORTABLE CHEST - 1 VIEW  COMPARISON:  06/24/2011 and scout image for chest CT dated 06/25/2011  FINDINGS: Heart size and pulmonary vascularity are normal and the lungs are clear. No acute osseous abnormality.  IMPRESSION: No acute abnormalities.   Electronically Signed   By: Rozetta Nunnery M.D.   On: 03/08/2014 14:22     Assessment, Plan, & Recommendations by Problem: 49 yr old woman with PMH of DM2, HTN, dyslipidemia, presenting with shortness of breath and questionable altered mental status, anxiety, nasal congestion, and teeth decay with abscess.   Altered mental status: Resolved. The patient was at baseline at the time of the interview with the IMTS. Per the ED provider the patient was alert and oriented but could no remember her home address during her initial interview. She reported no confusion or change in mentation to the IMTS and requested to  go home. Her CT head was negative with no head trauma, no headache.  She does have flat affect with occasional tearing when talking about life stressors. The patient requested to go home and appeared to be stable for discharge. She was seen and examined by Dr. Beryle Beams, our attending, and was discharged from the E.   Upper respiratory Disease: She has boggy nasal turbinates with bilateral nasal congestion, has been afebrile, with no leucocytosis, not sinus pressure/pain. Etiology likely viral v seasonal  allergies.  She will be discharge from the ED with prescription for Claritin 10mg  daily--appreciate case manager's assistance in helping the patient get an appointment at the [place] and with getting her medications prior to her discharge.   Tooth decay with abscess:  Her lower front teeth are decayed with gingivitis and early abscess formation.  She was prescribed Clarithromycin 500mg  BID #20 (bottle of full course Abx was given to her prior to her departure).   DM2: Blood glucose of 400 per BMET. She has had DM2 for years, was on metformin but has not taken in 1 month due to not having a PCP. She was given prescription for metformin prior to her departure. She will follow up with the [facility] the case manager's assistance with this appointment.   HTN: Her BP in the ED ranged from 118/88 to 153/93. She will follow up with her new PCP for further management of this problem.    Dispo: She was discharged from the ED with prescription for 30 day supply of metformin 500mg  daily ac, clarithromycin 500mg  PO BID #20, and claritin 10mg  PO daily #30. She will follow up with her new PCP at the [facility] and [facility].   The patient does not have a current PCP (No Pcp Per Patient) but will follow up with the [facility] after discharge.  The patient does not have transportation limitations that hinder transportation to clinic  appointments.  Signed: Blain Pais, MD  IMTS, PGY3 03/08/2014, 7:04 PM

## 2014-03-08 NOTE — ED Notes (Signed)
Pt unresponsive upon arrival to ED, per Triage RN, pt was dropped off at the ED, reports pt having CP onset x 3 days, pt responds to painful stimuli, pt able to assist with movement from wheelchair to stretcher, MD at bedside

## 2014-03-08 NOTE — ED Notes (Signed)
Internal Medicine MD at bedside

## 2014-03-08 NOTE — Progress Notes (Signed)
ED CM received referral from [facility] regarding patient not having a primary care doctor or health insurance. Met with patient at bedside, patient states, she has been stressed and concerned about not having a PCP or health insurance. Discussed [place]  and the services provided for establishing care, patient agreeable.  Patient states that she has not been taking Metformin because she could not afford the prescription. Discussed that [facility] pharmacy will accept prescriptions received from the ED today and will  waive the payment this time for prescription patient verbalized understanding and appreciation. Meds were picked up from [facility] and handed to patient.   Appointment scheduled for 03/16/14 at 11:15 at  [facility] patient given directions to the clinic. Discussed the disposition plan with Dr. Billy Fischer ED Resident, and Internal Medicine Team they are agreeable to disposition plan. Abigail Butts RN also update. Patient being discharged home with son transported via private vehicle.

## 2014-03-08 NOTE — ED Notes (Signed)
Pt wheeled to nurses station to await Case management RN to bring Rx from [place]

## 2014-03-08 NOTE — H&P (Signed)
Date: 03/08/2014               Patient Name:  [name] MRN: [id number]  DOB: 04/25/1965 Age / Sex: 49 y.o., female   PCP: No Pcp Per Patient              Medical Service: Internal Medicine Teaching Service      Attending Physician: Dr. Johnna Acosta, MD    First Contact: Randa Evens, MS4 Pager: [phone]  Second Contact: Dr. Marvel Plan Pager: [phone]  Third Contact Dr. Ronnald Ramp Pager: [phone]       After Hours (After 5p/  First Contact Pager: [phone]  weekends / holidays): Second Contact Pager: [phone]   Source: Patient  Chief Complaint: "Shortness of breath"  History of Present Illness: [name] is a 49 y.o. female with PMH of Type 2 DM (noncompliant with medications), HTN, Dyslipidemia who presented with shortness of breath and diminished consciousness secondary to shortness of breath, anxiety, and nasal congestion from allergies also found to have lower central gingivitis on exam. She was brought to the ED earlier this afternoon by her step-father [name] [phone]). For the past few days she has experienced congestion with runny nose, headache, and head and chest pain. She states she has a history of asthma and allergies with attacks triggered by stress and heat.  Patient states she has been experienced stress with difficulty paying bills and affording medications, such as metformin. Since 1/10 she has been unable to tolerate "full meals" because of pain that was 8/10 in her mouth due to inflammation of her lower gums. Patient states she has tried Tylenol and Oragel for pain and since arriving to the hospital it has decreased to 4/10. Of note, patient is concerned about not having had her period for several months and has not had contact with a primary care provider for a long time.  In the emergency department, she received Tylenol 325 mg oral, Duoneb 82ml, Narcan 0.4 mg, and 1 L NS bolus.  Meds: No current facility-administered medications for this  encounter.   Current Outpatient Prescriptions  Medication Sig Dispense Refill  . ibuprofen (ADVIL,MOTRIN) 800 MG tablet Take 1 tablet (800 mg total) by mouth 3 (three) times daily. 21 tablet 0  . metFORMIN (GLUCOPHAGE) 500 MG tablet Take 1 tablet (500 mg total) by mouth 2 (two) times daily with a meal. 60 tablet 1    Allergies: Allergies as of 03/08/2014 - Review Complete 03/08/2014  Allergen Reaction Noted  . Penicillins Rash 04/26/2011   Past Medical History  Diagnosis Date  . Diabetes mellitus type 2 Diagnosed in 1998  . Asthma     Child  - HTN - Dyslipidemia  Past Surgical History  Procedure Laterality Date  . Cholecystectomy      Family History: Family History  Problem Relation Age of Onset  . Diabetes Mother     Social History; History   Social History  . Marital Status: Divorced    Spouse Name: N/A    Number of Children: N/A  . Years of Education: N/A   Occupational History  . Not on file.   Social History Main Topics  . Smoking status: Former Smoker -- 0.00 packs/day  . Smokeless tobacco: Not on file     Comment: Smoked when she was a teenager.   . Alcohol Use: Yes  . Drug Use: No  . Sexual Activity: Yes   Other Topics Concern  . Not on file   Social History Narrative  Lives with 3 children in [place]. Does not work     Review of Systems: Positive for: head, mouth, chest pain; stress; shortness of breath; runny nose Negative for: fever, chills, facial pain All other pertinent ROS as stated in HPI.   Physical Exam: Blood pressure 137/80, pulse 102, temperature 98.6 F (37 C), temperature source Oral, resp. rate 18, SpO2 98 %. General: resting in bed HEENT: Lower teeth appear dark brown suggesting advanced cavities. Lower gums are erythematous, tender to palpation, and have small pale area with abscess possibly forming. PERRL, EOMI, no scleral icterus, watery eyes, no conjunctival injection. Extensive keloid scar formation over left  earlobe Cardiac: RRR, no rubs, murmurs or gallops Pulm: clear to auscultation bilaterally, moving normal volumes of air Abd: soft, nontender, nondistended, BS present Ext: warm and well perfused, no pedal edema Skin: red scratches over right hand Psych:  sad affect and labile mood  Lab results: Basic Metabolic Panel:  Recent Labs  03/08/14 1325  NA 130*  K 4.3  CL 96  CO2 23  GLUCOSE 400*  BUN 8  CREATININE 0.76  CALCIUM 8.9   Liver Function Tests:  Recent Labs  03/08/14 1325  AST 31  ALT 14  ALKPHOS 83  BILITOT 1.9*  PROT 7.9  ALBUMIN 3.8   No results for input(s): LIPASE, AMYLASE in the last 72 hours.  Recent Labs  03/08/14 1328  AMMONIA 20   CBC:  Recent Labs  03/08/14 1325  WBC 9.9  NEUTROABS 7.0  HGB 13.6  HCT 38.0  MCV 87.8  PLT 343   Cardiac Enzymes:  Recent Labs  03/08/14 1325  TROPONINI <0.03   BNP: No results for input(s): PROBNP in the last 72 hours. D-Dimer: No results for input(s): DDIMER in the last 72 hours. CBG:  Recent Labs  03/08/14 1314  GLUCAP 399*   Hemoglobin A1C: No results for input(s): HGBA1C in the last 72 hours. Fasting Lipid Panel: No results for input(s): CHOL, HDL, LDLCALC, TRIG, CHOLHDL, LDLDIRECT in the last 72 hours. Thyroid Function Tests:  Recent Labs  03/08/14 1327  TSH 0.861   Anemia Panel: No results for input(s): VITAMINB12, FOLATE, FERRITIN, TIBC, IRON, RETICCTPCT in the last 72 hours. Coagulation: No results for input(s): LABPROT, INR in the last 72 hours. Urine Drug Screen: Drugs of Abuse     Component Value Date/Time   LABOPIA NONE DETECTED 03/08/2014 1345   COCAINSCRNUR NONE DETECTED 03/08/2014 1345   LABBENZ NONE DETECTED 03/08/2014 1345   AMPHETMU NONE DETECTED 03/08/2014 1345   THCU POSITIVE* 03/08/2014 1345   LABBARB NONE DETECTED 03/08/2014 1345    Alcohol Level: No results for input(s): ETH in the last 72 hours. Urinalysis:  Recent Labs  03/08/14 1345  COLORURINE  YELLOW  LABSPEC 1.019  PHURINE 6.0  GLUCOSEU NEGATIVE  HGBUR NEGATIVE  BILIRUBINUR SMALL*  KETONESUR 15*  PROTEINUR 30*  UROBILINOGEN 0.2  NITRITE NEGATIVE  LEUKOCYTESUR SMALL*    Imaging results:  Ct Head Wo Contrast  03/08/2014   CLINICAL DATA:  Altered mental status.  EXAM: CT HEAD WITHOUT CONTRAST  TECHNIQUE: Contiguous axial images were obtained from the base of the skull through the vertex without intravenous contrast.  COMPARISON:  None.  FINDINGS: No acute intracranial abnormality. Specifically, no hemorrhage, hydrocephalus, mass lesion, acute infarction, or significant intracranial injury. No acute calvarial abnormality.  Mucosal thickening in the paranasal sinuses. No air-fluid levels. Mastoid air cells are clear.  IMPRESSION: No acute intracranial abnormality.   Electronically Signed  By: Rolm Baptise M.D.   On: 03/08/2014 14:56   Dg Chest Port 1 View  03/08/2014   CLINICAL DATA:  Chest pain.  Weakness.  Altered mental status.  EXAM: PORTABLE CHEST - 1 VIEW  COMPARISON:  06/24/2011 and scout image for chest CT dated 06/25/2011  FINDINGS: Heart size and pulmonary vascularity are normal and the lungs are clear. No acute osseous abnormality.  IMPRESSION: No acute abnormalities.   Electronically Signed   By: Rozetta Nunnery M.D.   On: 03/08/2014 14:22    Other results: EKG: Normal sinus rhythm, HR 86  Assessment & Plan by Problem: Active Problems: -Altered Mental Status (improved) -Anxiety -Nasal Congestion -Gingivitis  [name] is a 49 y.o. female with PMH of Type 2 DM (noncompliant with medications), HTN, Dyslipidemia who presented with shortness of breath and diminished consciousness secondary to shortness of breath, anxiety, and nasal congestion from allergies also found to have lower central gingivitis on exam.  **Altered Mental Status with Chest Pain/Shortness of Breath: Symptoms are associated with emotional stress due to financial difficulties as well as nasal  congestion. Patient may have experienced a panic attack and appears to have a flat/sad affect on exam with possible chronic depression and anxiety. On 1/12 EKG and CXR were unremarkable and WBC was 9.9 with Temperature of 98.6 and clear lungs with no wheezing or crackles on exam. When we spoke with her patient was oriented to person, place, and time. She called her step-father and had a plan to go back home.  **Nasal Congestion: She presents with runny nose, watery eyes, and Differential includes asthma exacerbation, seasonal allergies, and viral or bacterial URI. Patient will be discharged on Claritin. She does not have an albuterol inhaler at home and reports having about 1 asthma attack per year. Patient will need to establish care with a primary care physician who can follow up on this  **Gingivitis: Patient reports having severe mouth pain with redness, warmth, tenderness, and evidence of cavities on exam. She will be discharged on Augmentin  **Type 2 DM: Patient is noncompliant with medications per chart review and states money is the greatest barrier to visiting physicians and buying medications. She will be discharged on Metformin 500 mg twice daily with meals  **History of HTN and Dyslipidemia: Patient's blood pressure has ranged from 118/88 to 153/93. She should follow up for these conditions with a PCP and to address concerns such as whether she has entered menopause  Code: Full  Dispo: Patient will likely be discharged from the ED to home  The patient does not have a current PCP (No Pcp Per Patient) and does need an [facility] hospital follow-up appointment after discharge.   This is a Careers information officer Note.  The care of the patient was discussed with Dr. Hayes Ludwig and the assessment and plan was formulated with their assistance.  Please see their note for official documentation of the patient encounter.   Signed: Laneta Simmers, Med Student Internal Medicine Teaching Service Team B2   [phone] 03/08/2014, 4:38 PM

## 2014-03-08 NOTE — ED Notes (Signed)
Pts boyfriend called, pt speaking with him on the phone

## 2014-03-08 NOTE — Discharge Instructions (Signed)
°Emergency Department Resource Guide °1) Find a Doctor and Pay Out of Pocket °Although you won't have to find out who is covered by your insurance plan, it is a good idea to ask around and get recommendations. You will then need to call the office and see if the doctor you have chosen will accept you as a new patient and what types of options they offer for patients who are self-pay. Some doctors offer discounts or will set up payment plans for their patients who do not have insurance, but you will need to ask so you aren't surprised when you get to your appointment. ° °2) Contact Your Local Health Department °Not all health departments have doctors that can see patients for sick visits, but many do, so it is worth a call to see if yours does. If you don't know where your local health department is, you can check in your phone book. The CDC also has a tool to help you locate your state's health department, and many state websites also have listings of all of their local health departments. ° °3) Find a Walk-in Clinic °If your illness is not likely to be very severe or complicated, you may want to try a walk in clinic. These are popping up all over the country in pharmacies, drugstores, and shopping centers. They're usually staffed by nurse practitioners or physician assistants that have been trained to treat common illnesses and complaints. They're usually fairly quick and inexpensive. However, if you have serious medical issues or chronic medical problems, these are probably not your best option. ° °No Primary Care Doctor: °- Call [facility] at  832-8000 - they can help you locate a primary care doctor that  accepts your insurance, provides certain services, etc. °- Physician Referral Service- 1-800-533-3463 ° °Chronic Pain Problems: °Organization         Address  Phone   Notes  °[facility]  (336) 297-2271 Patients need to be referred by their primary care doctor.  ° °Medication  Assistance: °Organization         Address  Phone   Notes  °[place] [facility] [address] °[place], [place] [address] (336) 641-8030 --Must be a resident of [place] °-- Must have NO insurance coverage whatsoever (no Medicaid/ Medicare, etc.) °-- The pt. MUST have a primary care doctor that directs their care regularly and follows them in the community °  °[facility]  (866) 331-1348   °[employer]  (888) 892-1162   ° °Agencies that provide inexpensive medical care: °Organization         Address  Phone   Notes  °[facility]  (336) 832-8035   °[facility]    (336) 832-7272   °[facility] [address] °[place],  [address] (336) 832-4777   °[facility] [address], °[place] (336) 271-4999   °[facility]    (336) 373-0678   °[facility]    (336) 272-1050   °[facility] ° [address], [place] Phone:  (336) 832-4444, Fax:  (336) 832-4440 Hours of Operation:  9 am - 6 pm, M-F.  Also accepts Medicaid/Medicare and self-pay.  °[facility] ° [address], [place] Phone: (336) 832-3150, Fax: (336) 832-3151. Hours of Operation:  8:30 am - 5:30 pm, M-F.  Also accepts Medicaid and self-pay.  °[facility] [place] [address], [place] Phone: (336) 878-6027   °[facility] [address], [place], [place] (336)723-1848, Ext. 123 Mondays & Thursdays: 7-9 AM.  First 15 patients are seen on a first come, first serve basis. °  ° °Medicaid-accepting [place] Providers: ° °Organization         Address  Phone   Notes  °[facility] [address], [place] (336) 641-2100 Also accepts self-pay patients.  °[facility] [address], [place] ° (336) 856-9996   °[facility] [address], [place]  (336) 288-8857   °[facility] [address] [place] [address], [place] (336) 299-7000   °Veita [facility] [address], [place]  ° (336) 373-1557 Only accepts [place] Access Medicaid patients after they have their name applied to their card.  ° °Self-Pay (no insurance) in [place]: ° °Organization         Address  Phone   Notes  °Sickle Cell Patients, [facility] [address], [place] (336) 832-1970   °[facility] [address], [place] (336) 832-4400   °[facility] ° [address], [place] (336) 992-4800   °[facility]/Dr. Osei-Bonsu ° [address] [place] [address], [place] or [address], [place] (336) 841-8500 Phone number for both [place] and [place] locations is the same.  °[facility] [address], [place] (336) 299-0000   °[facility] [address] [place] [address], [place] or [address] (336) 852-7530 °(336) 878-2260   °[facility] [address], [place] (336) 350-1642, phone; (336) 294-5005, fax Sees patients 1st and 3rd Saturday of every month.  Must not qualify for public or private insurance (i.e. Medicaid, Medicare, [place] Health Choice, Veterans' Benefits) • Household income should be no more than 200% of the poverty level •The clinic cannot treat you if you are pregnant or think you are pregnant • Sexually transmitted diseases are not treated at the clinic.  ° ° °Dental Care: °Organization         Address  Phone  Notes  °[place] [facility] [address],  (336) 641-6152 Accepts children up to age 21 who are enrolled in Medicaid or [place] Health Choice; pregnant women with a Medicaid card; and children who have applied for Medicaid or [place] Health Choice, but were declined, whose parents can pay a reduced fee at time of service.  °[place]  [facility] [place]  [address], [place] (336) 641-7733 Accepts children up to age 21 who are enrolled in Medicaid or  Health Choice; pregnant women with a Medicaid card; and children who have applied for Medicaid or  Health Choice, but were declined, whose parents can pay a reduced fee at time of service.  °[facility] ° [address],  (336) 641-4533 Patients are seen by appointment only. Walk-ins are not accepted. [facility] will see patients 18 years of age and older. °Monday - Tuesday (8am-5pm) °Most Wednesdays (8:30-5pm) °$30 per visit, cash only  °[facility] ° [address], [place] (336) 641-4533 Patients are seen by appointment only. Walk-ins are not accepted. [facility] will see patients 18 years of age and older. °One   Wednesday Evening (Monthly: Volunteer Based).  $30 per visit, cash only  °[facility]  (919) 537-3737 for adults; Children under age 4, call Graduate Pediatric Dentistry at (919) [phone]. Children aged 4-14, please call (919) [phone] to request a pediatric application. ° Dental services are provided in all areas of dental care including fillings, crowns and bridges, complete and partial dentures, implants, gum treatment, root canals, and extractions. Preventive care is also provided. Treatment is provided to both adults and children. °Patients are selected via a lottery and there is often a waiting list. °  °[facility] [address], °[place] ° (336) 763-8833 [email or web address] °  °[facility] [address], [place], [place] (336)723-1848, Ext. 123 Second and Fourth Thursday of each month, opens at 6:30 AM; Clinic ends at 9 AM.  Patients are seen on a first-come first-served basis, and a limited number are seen during each clinic.  ° °[facility] ° [address], [place], [place] (336) 723-7904    Eligibility Requirements °You must have lived in [place], [place], or [place] counties for at least the last three months. °  You cannot be eligible for state or federal sponsored healthcare insurance, including Veterans Administration, Medicaid, or Medicare. °  You generally cannot be eligible for healthcare insurance through your employer.  °  How to apply: °Eligibility screenings are held every Tuesday and Wednesday afternoon from 1:00 pm until 4:00 pm. You do not need an appointment for the interview!  °[facility] [address], [place], [place] 336-631-2330   °[facility]  336-342-8273   °[facility]  336-703-3100   °[facility]  336-570-6415   ° °Behavioral Health Resources in the Community: °Intensive Outpatient Programs °Organization         Address  Phone  Notes  °[facility] [address], [place], [place] 336-878-6098   °[facility] [address], [place], [place] 336-832-9800   °[facility] [address], [place], [place] ° 336-882-2125   °[facility] [address],  °[place], [place] 1-800-853-5163 or 336-641-4981   °Substance Abuse Resources °Organization         Address  Phone  Notes  °[facility]  336-882-2125   °[facility]  336-784-9470   °[facility]  336-285-9073   °[facility]  336-845-3988   °Residential & Outpatient Substance Abuse Program  1-800-659-3381   °Psychological Services °Organization         Address  Phone  Notes  °[facility]  336- 832-9600   °[facility]  336- 378-7881   °[facility] [address], [place] 1-800-853-5163 or 336-641-4981   ° °Mobile Crisis Teams °Organization         Address  Phone  Notes  °[facility], Mobile Crisis Care Unit  1-877-626-1772   °Assertive °[facility] ° [address]  [place], [place] 336-834-9664   °Sharon DeEsch [address] ° [place] 336-554-5454   ° °Self-Help/Support Groups °Organization         Address  Phone             Notes  °[facility]  - variety of support groups  336- 373-1402 Call for more information  °Narcotics Anonymous (NA), [facility] [address], °[place]  2 meetings at this location  ° °  Residential Treatment Programs °Organization         Address  Phone  Notes  °[facility] [address],    °[place]  1-866-801-8205   °[facility] ° [address], [place], [place] 704-293-8524   °[facility] [address], [place] 336-845-3988 Admissions: 8am-3pm M-F  °[facility] [address]    °[place], [place] 336-841-1104   °[facility] [address], [place], [place] 336-379-7146   °[facility] [address]  °[place], [place] 336-285-9073   °[facility] Intensive Outpatient [address], [place], [place] 336-852-3033   °[facility] [address]  °[place], [place] 1-877-615-2722 or 336-784-9470   °[facility] [address] [place], [place] 336-227-7417 Accepts Medicaid  °[facility] [address]  °[place] 1-800-659-3381 Substance Abuse/Addiction Treatment  ° °[place] Behavioral Health Resources °Organization         Address  Phone  Notes  °[facility]  (888) 581-9988   °Julie Brannon, PhD [address] [place], [place]   (336) 349-5553 or (336) 951-0000   °[facility]   [address] °[place], [place] (336) 349-4454   °[facility] [address], [place], [place] (336) 342-8316 Insurance/Medicaid/sponsorship through [facility]  °[facility] [address]                                    [place], [place] (336) 342-8316 Therapy/tele-psych/case    °[facility] [address]  ° [place], [place] (336) 349-2233    °Dr. Arfeen  (336) 349-4544   °[facility] [place]  [employer] [place] [facility] 1) [address],  °2) [address], [place] °3)  [address], [place] (336) 349-3220 °(336) 342-7768 ° °(336) 342-8140   °[place] [facility] (336) 342-1394 or (336) 342-3537 (After Hours)    ° ° °

## 2014-03-08 NOTE — ED Provider Notes (Signed)
CSN: [id number]     Arrival date & time 03/08/14  1303 History   First MD Initiated Contact with Patient 03/08/14 1306     Chief Complaint  Patient presents with  . Altered Mental Status     (Consider location/radiation/quality/duration/timing/severity/associated sxs/prior Treatment) Patient is a 49 y.o. female presenting with altered mental status. The history is provided by the patient. The history is limited by the condition of the patient.  Altered Mental Status Presenting symptoms: lethargy and partial responsiveness   Severity:  Severe Most recent episode:  Today Episode history:  Unable to specify Duration: unknown. Timing:  Unable to specify Progression:  Unable to specify Chronicity:  New Context: not drug use (patient denies any drug use)   Context comment:  Patient reports chest pain for 3 days   Past Medical History  Diagnosis Date  . Diabetes mellitus   . Asthma     Child   Past Surgical History  Procedure Laterality Date  . Cholecystectomy     Family History  Problem Relation Age of Onset  . Diabetes Mother    History  Substance Use Topics  . Smoking status: Former Smoker -- 0.00 packs/day  . Smokeless tobacco: Not on file     Comment: Smoked when she was a teenager.   . Alcohol Use: Yes   OB History    No data available     Review of Systems  Unable to perform ROS: Mental status change      Allergies  Penicillins  Home Medications   Prior to Admission medications   Medication Sig Start Date End Date Taking? Authorizing Provider  ibuprofen (ADVIL,MOTRIN) 800 MG tablet Take 1 tablet (800 mg total) by mouth 3 (three) times daily. 12/13/13  Yes Domenic Moras, [name]  metFORMIN (GLUCOPHAGE) 500 MG tablet Take 1 tablet (500 mg total) by mouth 2 (two) times daily with a meal. 10/05/13  Yes Britt Bottom, NP   BP 137/80 mmHg  Pulse 102  Temp(Src) 98.6 F (37 C) (Oral)  Resp 13  SpO2 97% Physical Exam  Constitutional: She appears  well-developed and well-nourished. She appears lethargic. No distress.  HENT:  Head: Normocephalic and atraumatic.  Right Ear: External ear normal.  Left ear with large keloid, appears old. No current wound or signs of infection. Small amount of fluctuance to gums of tooth 26 with scant pus being able to be expressed. No fluctuance to floor of mouth or deep space abscess.   Eyes: Conjunctivae are normal. Pupils are equal, round, and reactive to light.  Pupils 55mm, equal, round, and reactive bilaterally. Patient actively clinches eyes closed.   Neck: Normal range of motion. Neck supple. No JVD present. No tracheal deviation present. No thyromegaly present.  Cardiovascular: Regular rhythm, normal heart sounds and intact distal pulses.   No murmur heard. Regular tachycardia  Pulmonary/Chest: Effort normal. No stridor. She has wheezes (moderate expiratory wheezing diffusely bilaterally).  Abdominal: Soft. Bowel sounds are normal. She exhibits no distension. There is no rebound.  Musculoskeletal: Normal range of motion. She exhibits no edema or tenderness.  Neurological: She appears lethargic. GCS eye subscore is 2. GCS verbal subscore is 4. GCS motor subscore is 6.  Patient actively moves all 4 extremities and follows simple commands. Patient speech is not slurred but is slowed and confused.  Skin: Skin is warm and dry. She is not diaphoretic.    ED Course  Procedures (including critical care time) Labs Review Labs Reviewed  COMPREHENSIVE METABOLIC PANEL - Abnormal;  Notable for the following:    Sodium 130 (*)    Glucose, Bld 400 (*)    Total Bilirubin 1.9 (*)    All other components within normal limits  URINALYSIS, ROUTINE W REFLEX MICROSCOPIC - Abnormal; Notable for the following:    APPearance TURBID (*)    Bilirubin Urine SMALL (*)    Ketones, ur 15 (*)    Protein, ur 30 (*)    Leukocytes, UA SMALL (*)    All other components within normal limits  URINE RAPID DRUG SCREEN (HOSP  PERFORMED) - Abnormal; Notable for the following:    Tetrahydrocannabinol POSITIVE (*)    All other components within normal limits  CBG MONITORING, ED - Abnormal; Notable for the following:    Glucose-Capillary 399 (*)    All other components within normal limits  I-STAT ARTERIAL BLOOD GAS, ED - Abnormal; Notable for the following:    Bicarbonate 24.2 (*)    All other components within normal limits  CBC WITH DIFFERENTIAL  TROPONIN I  TSH  AMMONIA  URINE MICROSCOPIC-ADD ON  ETHANOL  BLOOD GAS, ARTERIAL  I-STAT CG4 LACTIC ACID, ED  POC URINE PREG, ED    Imaging Review Ct Head Wo Contrast  03/08/2014   CLINICAL DATA:  Altered mental status.  EXAM: CT HEAD WITHOUT CONTRAST  TECHNIQUE: Contiguous axial images were obtained from the base of the skull through the vertex without intravenous contrast.  COMPARISON:  None.  FINDINGS: No acute intracranial abnormality. Specifically, no hemorrhage, hydrocephalus, mass lesion, acute infarction, or significant intracranial injury. No acute calvarial abnormality.  Mucosal thickening in the paranasal sinuses. No air-fluid levels. Mastoid air cells are clear.  IMPRESSION: No acute intracranial abnormality.   Electronically Signed   By: Rolm Baptise M.D.   On: 03/08/2014 14:56   Dg Chest Port 1 View  03/08/2014   CLINICAL DATA:  Chest pain.  Weakness.  Altered mental status.  EXAM: PORTABLE CHEST - 1 VIEW  COMPARISON:  06/24/2011 and scout image for chest CT dated 06/25/2011  FINDINGS: Heart size and pulmonary vascularity are normal and the lungs are clear. No acute osseous abnormality.  IMPRESSION: No acute abnormalities.   Electronically Signed   By: Rozetta Nunnery M.D.   On: 03/08/2014 14:22      Date: 03/08/2014  Rate: 111  Rhythm: sinus tachycardia  QRS Axis: normal  Intervals: normal  ST/T Wave abnormalities: nonspecific ST changes  Conduction Disutrbances:none  Narrative Interpretation:   Old EKG Reviewed: unchanged   MDM   Final  diagnoses:  Altered mental status  Dental abscess    The patient is a 49 year old female with past medical history of diabetes and asthma who was dropped off at the emergency department by an unknown person and is found to be partially responsive. Patient was taken to a trauma bay where she is confirmed to be hemodynamically stable and protecting her airway. GCS is 12 as above. EKG shows sinus tachycardia is not remarkable for any acute ischemic changes. The patient denies any drug use and Narcan does not improve mental status. Ammonia inhalants significantly improve the patient's mental status and completely terminate her eye clinching. Patient's mental status improved significantly throughout her stay in the emergency department. Workup including EKG, extensive lab workup including troponin, chest x-ray, and head CT revealed no acute findings on the urine drug screen does test positive for marijuana which patient adamantly denies ever using her lifetime.   All patient's mental status does improve she is still  extremely confused as to the events of today and how she arrived at the hospital. The patient's boyfriend on Wynetta Emery is reached via telephone reports the last time he saw the patient was yesterday and is unaware of how she reached the hospital. The patient's stepfather Laneta Simmers is reached via telephone and reports that he brought her to the hospital today because she was complaining of chest pain. Patient denies any chest pain at this time and given normal EKG undetectable troponin and normal chest x-ray do not suspect ACS or other acute cardiac pathology. Patient denies any prolonged immobilization, leg swelling, hemoptysis, or shortness of breath. Do not suspect PE.  Given the patient's significant altered mental status and is recommended to her that she be admitted to the internal medicine service. Internal medicine teaching service was consulted and evaluates the patient in the ED and  recommend she be discharged home stating they do not suspect any acute medical pathology. Please see their note for more detail. Patient's stepfather is reached via telephone he reports he can pick patient up Hospital. Will give patient one week of Augmentin for dental abscess, and contact information to establish care with a primary care doctor and a dentist. Care manager meet with the patient to discuss establishing these appointments and the patient being able to afford her medications. Patient versus understanding and agreement with this plan.  Patient seen with attending, Dr. Sabra Heck, who oversaw clinical decision making.   Margaretann Loveless, MD 03/08/14 1648  Johnna Acosta, MD 03/09/14 1515

## 2014-03-08 NOTE — Progress Notes (Signed)
Spoke with pt who informed CSW that she has spoken with her father and he is coming to get her at d/c.  Pt states that he brought her to the ED this am after she was having a hard time breathing and "blanked out."

## 2014-03-08 NOTE — ED Provider Notes (Signed)
  Physical Exam  BP 126/84 mmHg  Pulse 100  Temp(Src) 98.6 F (37 C) (Oral)  Resp 14  SpO2 98%  Physical Exam  ED Course  Procedures  MDM Patient care assumed from Dr. Rolene Arbour at 4 PM. Please see his note for prior history and physical. Briefly this is a 56 her old female with a history of diabetes and asthma who presented with altered mental status.  There was recommended that patient be admitted to the hospital and the internal medicine teaching service as was consulted.  Their final recommendations are pending at time of transfer of care.    The medicine attending and medicine teaching service and evaluated the patient and feel that she is appropriate for outpatient follow-up.  She will be given one week of Augmentin for dental abscess. She is provided information to follow up with the primary care physician and dentist per Care Management as set up by Dr. Rhina Brackett.  Patient discharged in stable condition with understanding of reasons to return.       Gareth Morgan, MD 03/10/14 [id number]  Johnna Acosta, MD 03/10/14 1324

## 2014-03-16 ENCOUNTER — Inpatient Hospital Stay: Payer: Self-pay | Admitting: Family Medicine

## 2014-07-29 ENCOUNTER — Encounter (HOSPITAL_COMMUNITY): Payer: Self-pay | Admitting: Cardiology

## 2014-07-29 ENCOUNTER — Emergency Department (HOSPITAL_COMMUNITY)
Admission: EM | Admit: 2014-07-29 | Discharge: 2014-07-30 | Disposition: A | Discharge status: Home / Self Care | Payer: Medicaid Other | Attending: Emergency Medicine | Admitting: Emergency Medicine

## 2014-07-29 DIAGNOSIS — J45909 Unspecified asthma, uncomplicated: Secondary | ICD-10-CM | POA: Diagnosis not present

## 2014-07-29 DIAGNOSIS — N390 Urinary tract infection, site not specified: Secondary | ICD-10-CM | POA: Insufficient documentation

## 2014-07-29 DIAGNOSIS — Z79899 Other long term (current) drug therapy: Secondary | ICD-10-CM | POA: Insufficient documentation

## 2014-07-29 DIAGNOSIS — Z880 Allergy status to penicillin: Secondary | ICD-10-CM | POA: Diagnosis not present

## 2014-07-29 DIAGNOSIS — Z3202 Encounter for pregnancy test, result negative: Secondary | ICD-10-CM | POA: Insufficient documentation

## 2014-07-29 DIAGNOSIS — Z791 Long term (current) use of non-steroidal anti-inflammatories (NSAID): Secondary | ICD-10-CM | POA: Insufficient documentation

## 2014-07-29 DIAGNOSIS — R739 Hyperglycemia, unspecified: Secondary | ICD-10-CM

## 2014-07-29 DIAGNOSIS — Z87891 Personal history of nicotine dependence: Secondary | ICD-10-CM | POA: Insufficient documentation

## 2014-07-29 DIAGNOSIS — E1165 Type 2 diabetes mellitus with hyperglycemia: Secondary | ICD-10-CM | POA: Diagnosis not present

## 2014-07-29 DIAGNOSIS — R109 Unspecified abdominal pain: Secondary | ICD-10-CM | POA: Diagnosis present

## 2014-07-29 LAB — URINALYSIS, ROUTINE W REFLEX MICROSCOPIC
Bilirubin Urine: NEGATIVE
Hgb urine dipstick: NEGATIVE
Ketones, ur: 15 mg/dL — AB
NITRITE: NEGATIVE
Protein, ur: NEGATIVE mg/dL
Specific Gravity, Urine: 1.024 (ref 1.005–1.030)
Urobilinogen, UA: 1 mg/dL (ref 0.0–1.0)
pH: 5 (ref 5.0–8.0)

## 2014-07-29 LAB — BASIC METABOLIC PANEL
Anion gap: 11 (ref 5–15)
BUN: 9 mg/dL (ref 6–20)
CO2: 28 mmol/L (ref 22–32)
Calcium: 8.7 mg/dL — ABNORMAL LOW (ref 8.9–10.3)
Chloride: 95 mmol/L — ABNORMAL LOW (ref 101–111)
Creatinine, Ser: 0.8 mg/dL (ref 0.44–1.00)
GFR calc Af Amer: >60 mL/min (ref >60)
GFR calc non Af Amer: >60 mL/min (ref >60)
GLUCOSE: 275 mg/dL — AB (ref 65–99)
Potassium: 2.9 mmol/L — ABNORMAL LOW (ref 3.5–5.1)
Sodium: 134 mmol/L — ABNORMAL LOW (ref 135–145)

## 2014-07-29 LAB — COMPREHENSIVE METABOLIC PANEL
ALT: <5 U/L — ABNORMAL LOW (ref 14–54)
AST: 36 U/L (ref 15–41)
Albumin: 3.9 g/dL (ref 3.5–5.0)
Alkaline Phosphatase: 82 U/L (ref 38–126)
Anion gap: 15 (ref 5–15)
BUN: 10 mg/dL (ref 6–20)
CHLORIDE: 90 mmol/L — AB (ref 101–111)
CO2: 24 mmol/L (ref 22–32)
Calcium: 9.4 mg/dL (ref 8.9–10.3)
Creatinine, Ser: 1.05 mg/dL — ABNORMAL HIGH (ref 0.44–1.00)
GFR calc Af Amer: >60 mL/min (ref >60)
GFR calc non Af Amer: >60 mL/min (ref >60)
GLUCOSE: 515 mg/dL — AB (ref 65–99)
POTASSIUM: 3 mmol/L — AB (ref 3.5–5.1)
Sodium: 129 mmol/L — ABNORMAL LOW (ref 135–145)
Total Bilirubin: 0.8 mg/dL (ref 0.3–1.2)
Total Protein: 8.5 g/dL — ABNORMAL HIGH (ref 6.5–8.1)

## 2014-07-29 LAB — URINE MICROSCOPIC-ADD ON

## 2014-07-29 LAB — CBC WITH DIFFERENTIAL/PLATELET
BASOS ABS: 0 10*3/uL (ref 0.0–0.1)
Basophils Relative: 0 % (ref 0–1)
Eosinophils Absolute: 0 10*3/uL (ref 0.0–0.7)
Eosinophils Relative: 0 % (ref 0–5)
HEMATOCRIT: 42.1 % (ref 36.0–46.0)
Hemoglobin: 14.4 g/dL (ref 12.0–15.0)
Lymphocytes Relative: 34 % (ref 12–46)
Lymphs Abs: 2.8 10*3/uL (ref 0.7–4.0)
MCH: 30.1 pg (ref 26.0–34.0)
MCHC: 34.2 g/dL (ref 30.0–36.0)
MCV: 88.1 fL (ref 78.0–100.0)
MONO ABS: 0.6 10*3/uL (ref 0.1–1.0)
Monocytes Relative: 8 % (ref 3–12)
NEUTROS ABS: 4.6 10*3/uL (ref 1.7–7.7)
NEUTROS PCT: 58 % (ref 43–77)
PLATELETS: 375 10*3/uL (ref 150–400)
RBC: 4.78 MIL/uL (ref 3.87–5.11)
RDW: 12.3 % (ref 11.5–15.5)
WBC: 8.1 10*3/uL (ref 4.0–10.5)

## 2014-07-29 LAB — POC URINE PREG, ED: Preg Test, Ur: NEGATIVE

## 2014-07-29 LAB — CBG MONITORING, ED: Glucose-Capillary: 248 mg/dL — ABNORMAL HIGH (ref 65–99)

## 2014-07-29 MED ORDER — POLYETHYLENE GLYCOL 3350 17 GM/SCOOP PO POWD: 17.0000 g | Freq: Two times a day (BID) | ORAL | Status: DC

## 2014-07-29 MED ORDER — INSULIN ASPART 100 UNIT/ML ~~LOC~~ SOLN
10.0000 [IU] | Freq: Once | SUBCUTANEOUS | Status: AC
  Administered 2014-07-29: 10 [IU] via SUBCUTANEOUS
  Filled 2014-07-29: qty 1

## 2014-07-29 MED ORDER — METFORMIN HCL 500 MG PO TABS: 500.0000 mg | ORAL_TABLET | Freq: Two times a day (BID) | ORAL | Status: DC

## 2014-07-29 MED ORDER — SODIUM CHLORIDE 0.9 % IV BOLUS (SEPSIS)
2000.0000 mL | Freq: Once | INTRAVENOUS | Status: AC
  Administered 2014-07-29: 2000 mL via INTRAVENOUS

## 2014-07-29 MED ORDER — POLYETHYLENE GLYCOL 3350 17 G PO PACK
17.0000 g | PACK | Freq: Every day | ORAL | Status: DC
  Administered 2014-07-29: 17 g via ORAL
  Filled 2014-07-29: qty 1

## 2014-07-29 MED ORDER — POTASSIUM CHLORIDE 10 MEQ/100ML IV SOLN
10.0000 meq | Freq: Once | INTRAVENOUS | Status: AC
  Administered 2014-07-29: 10 meq via INTRAVENOUS
  Filled 2014-07-29: qty 100

## 2014-07-29 MED ORDER — SODIUM CHLORIDE 0.9 % IV BOLUS (SEPSIS)
1000.0000 mL | Freq: Once | INTRAVENOUS | Status: AC
  Administered 2014-07-29: 1000 mL via INTRAVENOUS

## 2014-07-29 MED ORDER — SODIUM CHLORIDE 0.9 % IV BOLUS (SEPSIS): 1000.0000 mL | Freq: Once | INTRAVENOUS | Status: DC

## 2014-07-29 MED ORDER — ONDANSETRON HCL 4 MG/2ML IJ SOLN
4.0000 mg | Freq: Once | INTRAMUSCULAR | Status: AC
  Administered 2014-07-29: 4 mg via INTRAVENOUS
  Filled 2014-07-29: qty 2

## 2014-07-29 MED ORDER — CEPHALEXIN 500 MG PO CAPS: 500.0000 mg | ORAL_CAPSULE | Freq: Four times a day (QID) | ORAL | Status: DC

## 2014-07-29 MED ORDER — ONDANSETRON HCL 4 MG/2ML IJ SOLN: 4.0000 mg | Freq: Once | INTRAMUSCULAR | Status: DC

## 2014-07-29 MED ORDER — POTASSIUM CHLORIDE CRYS ER 20 MEQ PO TBCR
40.0000 meq | EXTENDED_RELEASE_TABLET | Freq: Once | ORAL | Status: AC
  Administered 2014-07-29: 40 meq via ORAL
  Filled 2014-07-29: qty 2

## 2014-07-29 NOTE — ED Provider Notes (Signed)
CSN: [id number]     Arrival date & time 07/29/14  1342 History   First MD Initiated Contact with Patient 07/29/14 1714     Chief Complaint  Patient presents with  . Abdominal Pain  . Back Pain     (Consider location/radiation/quality/duration/timing/severity/associated sxs/prior Treatment) HPI [name] is a 49 y.o. female history of type 2 diabetes comes in for evaluation of abdominal discomfort. Patient states on Monday she began to experience "gas pains" that sometimes radiates throughout her abdomen into her back. She denies any overt back pain. She rates her discomfort as a 4/10. She has not tried anything to improve her symptoms. Nothing makes it better or worse. He reports associated nausea and vomiting and intermittently throughout the week, but has not vomited today and does not feel nauseous. Denies any fevers, chills, urinary symptoms, vaginal bleeding or discharge, chest pain, shortness of breath. Of note, patient takes metformin. Reports she only takes it once a day and has not taken it today.  Past Medical History  Diagnosis Date  . Diabetes mellitus   . Asthma     Child   Past Surgical History  Procedure Laterality Date  . Cholecystectomy     Family History  Problem Relation Age of Onset  . Diabetes Mother    History  Substance Use Topics  . Smoking status: Former Smoker -- 0.00 packs/day  . Smokeless tobacco: Not on file     Comment: Smoked when she was a teenager.   . Alcohol Use: Yes   OB History    No data available     Review of Systems A 10 point review of systems was completed and was negative except for pertinent positives and negatives as mentioned in the history of present illness     Allergies  Penicillins  Home Medications   Prior to Admission medications   Medication Sig Start Date End Date Taking? Authorizing Provider  ibuprofen (ADVIL,MOTRIN) 800 MG tablet Take 1 tablet (800 mg total) by mouth 3 (three) times daily. 12/13/13  Yes  Domenic Moras, [name]  loratadine (CLARITIN) 10 MG tablet Take 1 tablet (10 mg total) by mouth daily. 03/08/14  Yes Gareth Morgan, MD  cephALEXin (KEFLEX) 500 MG capsule Take 1 capsule (500 mg total) by mouth 4 (four) times daily. 07/29/14   Comer Locket, [name]  metFORMIN (GLUCOPHAGE) 500 MG tablet Take 1 tablet (500 mg total) by mouth 2 (two) times daily with a meal. 07/29/14   Comer Locket, [name]  polyethylene glycol powder (GLYCOLAX/MIRALAX) powder Take 17 g by mouth 2 (two) times daily. Until daily soft stools  OTC 07/29/14   Comer Locket, [name]   BP 185/112 mmHg  Pulse 100  Temp(Src) 98.8 F (37.1 C) (Oral)  Resp 17  Ht 5\' 6"  (1.676 m)  Wt 230 lb (104.327 kg)  BMI 37.14 kg/m2  SpO2 99%  LMP 07/15/2014 Physical Exam  Constitutional: She is oriented to person, place, and time. She appears well-developed and well-nourished.  HENT:  Head: Normocephalic and atraumatic.  Mouth/Throat: Oropharynx is clear and moist.  Eyes: Conjunctivae are normal. Pupils are equal, round, and reactive to light. Right eye exhibits no discharge. Left eye exhibits no discharge. No scleral icterus.  Neck: Neck supple.  Cardiovascular: Normal rate, regular rhythm and normal heart sounds.   Pulmonary/Chest: Effort normal and breath sounds normal. No respiratory distress. She has no wheezes. She has no rales.  Abdominal: Soft. She exhibits no distension. There is no tenderness.  Abdomen soft, nondistended  and nontender. There is no rebound or guarding. No palpable masses or other obvious lesions or deformities. Active bowel sounds throughout. No evidence of acute or surgical abdomen.  Musculoskeletal: She exhibits no tenderness.  Neurological: She is alert and oriented to person, place, and time.  Cranial Nerves II-XII grossly intact  Skin: Skin is warm and dry. No rash noted.  Psychiatric: She has a normal mood and affect.  Nursing note and vitals reviewed.   ED Course  Procedures (including critical  care time) Labs Review Labs Reviewed  COMPREHENSIVE METABOLIC PANEL - Abnormal; Notable for the following:    Sodium 129 (*)    Potassium 3.0 (*)    Chloride 90 (*)    Glucose, Bld 515 (*)    Creatinine, Ser 1.05 (*)    Total Protein 8.5 (*)    ALT <5 (*)    All other components within normal limits  URINALYSIS, ROUTINE W REFLEX MICROSCOPIC (NOT AT [facility]) - Abnormal; Notable for the following:    APPearance CLOUDY (*)    Glucose, UA >1000 (*)    Ketones, ur 15 (*)    Leukocytes, UA SMALL (*)    All other components within normal limits  URINE MICROSCOPIC-ADD ON - Abnormal; Notable for the following:    Bacteria, UA MANY (*)    All other components within normal limits  BASIC METABOLIC PANEL - Abnormal; Notable for the following:    Sodium 134 (*)    Potassium 2.9 (*)    Chloride 95 (*)    Glucose, Bld 275 (*)    Calcium 8.7 (*)    All other components within normal limits  CBG MONITORING, ED - Abnormal; Notable for the following:    Glucose-Capillary 248 (*)    All other components within normal limits  CBC WITH DIFFERENTIAL/PLATELET  POC URINE PREG, ED  CBG MONITORING, ED    Imaging Review No results found.   EKG Interpretation None     Meds given in ED:  Medications  polyethylene glycol (MIRALAX / GLYCOLAX) packet 17 g (17 g Oral Given 07/29/14 1846)  potassium chloride SA (K-DUR,KLOR-CON) CR tablet 40 mEq (40 mEq Oral Given 07/29/14 1846)  ondansetron (ZOFRAN) injection 4 mg (4 mg Intravenous Given 07/29/14 1847)  sodium chloride 0.9 % bolus 2,000 mL (0 mLs Intravenous Stopped 07/29/14 2057)  insulin aspart (novoLOG) injection 10 Units (10 Units Subcutaneous Given 07/29/14 1847)  potassium chloride 10 mEq in 100 mL IVPB (0 mEq Intravenous Stopped 07/29/14 1958)  sodium chloride 0.9 % bolus 1,000 mL (0 mLs Intravenous Stopped 07/29/14 2236)    New Prescriptions   CEPHALEXIN (KEFLEX) 500 MG CAPSULE    Take 1 capsule (500 mg total) by mouth 4 (four) times daily.   METFORMIN  (GLUCOPHAGE) 500 MG TABLET    Take 1 tablet (500 mg total) by mouth 2 (two) times daily with a meal.   POLYETHYLENE GLYCOL POWDER (GLYCOLAX/MIRALAX) POWDER    Take 17 g by mouth 2 (two) times daily. Until daily soft stools  OTC   Filed Vitals:   07/29/14 2045 07/29/14 2115 07/29/14 2145 07/29/14 2200  BP: 144/84 173/93 189/100 185/112  Pulse: 96 101 94 100  Temp:      TempSrc:      Resp: 15   17  Height:      Weight:      SpO2: 99% 99% 97% 99%    MDM  Vitals stable -afebrile, mild tachycardia likely due to UTI. Pt resting comfortably in  ED. denies discomfort in ED. Tolerating PO PE--normal abdominal exam. Refuses rectal examination, prefers to start with laxitives. Physical exam is otherwise grossly benign. Labwork--evidence of mild DKA, UTI. Hyponatremia likely secondary to hyperglycemia and dehydration. Potassium 3.0, given trial of oral and IV. CBG has improved from 515 to 248. Patient states she feels much better after administration of fluids and anti-emetics. Gap is closing from 15 to 11.  Will treat UTI empirically, represcribed metformin at 500 mg twice a day. Also discussed follow-up with health and wellness for further control of blood pressure. I discussed all relevant lab findings and imaging results with pt and they verbalized understanding. Discussed f/u with PCP within 48 hrs and return precautions, pt very amenable to plan. Patient stable, in good condition and is appropriate for discharge to follow-up with her primary care.  Prior to patient discharge, I discussed and reviewed this case with Dr. Darl Householder    Final diagnoses:  Hyperglycemia  Abdominal discomfort  UTI (lower urinary tract infection)       Comer Locket, [name] 07/30/14 0006  Wandra Arthurs, MD 07/30/14 [id number]

## 2014-07-29 NOTE — Discharge Instructions (Signed)
You were evaluated in the ED today for your abdominal discomfort. You were found to have high sugar. You're also found to have a UTI. You were treated in the ED for your high sugar with IV fluids. The rest of her exam and lab work were reassuring. It is important for you to take your antibiotic for a UTI as prescribed and until gone. It is also important. Follow-up with your primary care for further evaluation and management of your symptoms. Return to ED for worsening symptoms.  Blood Glucose Monitoring Monitoring your blood glucose (also know as blood sugar) helps you to manage your diabetes. It also helps you and your health care provider monitor your diabetes and determine how well your treatment plan is working. WHY SHOULD YOU MONITOR YOUR BLOOD GLUCOSE?  It can help you understand how food, exercise, and medicine affect your blood glucose.  It allows you to know what your blood glucose is at any given moment. You can quickly tell if you are having low blood glucose (hypoglycemia) or high blood glucose (hyperglycemia).  It can help you and your health care provider know how to adjust your medicines.  It can help you understand how to manage an illness or adjust medicine for exercise. WHEN SHOULD YOU TEST? Your health care provider will help you decide how often you should check your blood glucose. This may depend on the type of diabetes you have, your diabetes control, or the types of medicines you are taking. Be sure to write down all of your blood glucose readings so that this information can be reviewed with your health care provider. See below for examples of testing times that your health care provider may suggest. Type 1 Diabetes  Test 4 times a day if you are in good control, using an insulin pump, or perform multiple daily injections.  If your diabetes is not well controlled or if you are sick, you may need to monitor more often.  It is a good idea to also monitor:  Before and  after exercise.  Between meals and 2 hours after a meal.  Occasionally between 2:00 a.m. and 3:00 a.m. Type 2 Diabetes  It can vary with each person, but generally, if you are on insulin, test 4 times a day.  If you take medicines by mouth (orally), test 2 times a day.  If you are on a controlled diet, test once a day.  If your diabetes is not well controlled or if you are sick, you may need to monitor more often. HOW TO MONITOR YOUR BLOOD GLUCOSE Supplies Needed  Blood glucose meter.  Test strips for your meter. Each meter has its own strips. You must use the strips that go with your own meter.  A pricking needle (lancet).  A device that holds the lancet (lancing device).  A journal or log book to write down your results. Procedure  Wash your hands with soap and water. Alcohol is not preferred.  Prick the side of your finger (not the tip) with the lancet.  Gently milk the finger until a small drop of blood appears.  Follow the instructions that come with your meter for inserting the test strip, applying blood to the strip, and using your blood glucose meter. Other Areas to Get Blood for Testing Some meters allow you to use other areas of your body (other than your finger) to test your blood. These areas are called alternative sites. The most common alternative sites are:  The forearm.  The thigh.  The back area of the lower leg.  The palm of the hand. The blood flow in these areas is slower. Therefore, the blood glucose values you get may be delayed, and the numbers are different from what you would get from your fingers. Do not use alternative sites if you think you are having hypoglycemia. Your reading will not be accurate. Always use a finger if you are having hypoglycemia. Also, if you cannot feel your lows (hypoglycemia unawareness), always use your fingers for your blood glucose checks. ADDITIONAL TIPS FOR GLUCOSE MONITORING  Do not reuse lancets.  Always  carry your supplies with you.  All blood glucose meters have a 24-hour "hotline" number to call if you have questions or need help.  Adjust (calibrate) your blood glucose meter with a control solution after finishing a few boxes of strips. BLOOD GLUCOSE RECORD KEEPING It is a good idea to keep a daily record or log of your blood glucose readings. Most glucose meters, if not all, keep your glucose records stored in the meter. Some meters come with the ability to download your records to your home computer. Keeping a record of your blood glucose readings is especially helpful if you are wanting to look for patterns. Make notes to go along with the blood glucose readings because you might forget what happened at that exact time. Keeping good records helps you and your health care provider to work together to achieve good diabetes management.  Document Released: 02/14/2003 Document Revised: 06/28/2013 Document Reviewed: 07/06/2012 [facility] Patient Information 2015 [place], [place]. This information is not intended to replace advice given to you by your health care provider. Make sure you discuss any questions you have with your health care provider.  Hyperglycemia Hyperglycemia occurs when the glucose (sugar) in your blood is too high. Hyperglycemia can happen for many reasons, but it most often happens to people who do not know they have diabetes or are not managing their diabetes properly.  CAUSES  Whether you have diabetes or not, there are other causes of hyperglycemia. Hyperglycemia can occur when you have diabetes, but it can also occur in other situations that you might not be as aware of, such as: Diabetes  If you have diabetes and are having problems controlling your blood glucose, hyperglycemia could occur because of some of the following reasons:  Not following your meal plan.  Not taking your diabetes medications or not taking it properly.  Exercising less or doing less activity than you  normally do.  Being sick. Pre-diabetes  This cannot be ignored. Before people develop Type 2 diabetes, they almost always have "pre-diabetes." This is when your blood glucose levels are higher than normal, but not yet high enough to be diagnosed as diabetes. Research has shown that some long-term damage to the body, especially the heart and circulatory system, may already be occurring during pre-diabetes. If you take action to manage your blood glucose when you have pre-diabetes, you may delay or prevent Type 2 diabetes from developing. Stress  If you have diabetes, you may be "diet" controlled or on oral medications or insulin to control your diabetes. However, you may find that your blood glucose is higher than usual in the hospital whether you have diabetes or not. This is often referred to as "stress hyperglycemia." Stress can elevate your blood glucose. This happens because of hormones put out by the body during times of stress. If stress has been the cause of your high blood glucose, it  can be followed regularly by your caregiver. That way he/she can make sure your hyperglycemia does not continue to get worse or progress to diabetes. Steroids  Steroids are medications that act on the infection fighting system (immune system) to block inflammation or infection. One side effect can be a rise in blood glucose. Most people can produce enough extra insulin to allow for this rise, but for those who cannot, steroids make blood glucose levels go even higher. It is not unusual for steroid treatments to "uncover" diabetes that is developing. It is not always possible to determine if the hyperglycemia will go away after the steroids are stopped. A special blood test called an A1c is sometimes done to determine if your blood glucose was elevated before the steroids were started. SYMPTOMS  Thirsty.  Frequent urination.  Dry mouth.  Blurred vision.  Tired or fatigue.  Weakness.  Sleepy.  Tingling  in feet or leg. DIAGNOSIS  Diagnosis is made by monitoring blood glucose in one or all of the following ways:  A1c test. This is a chemical found in your blood.  Fingerstick blood glucose monitoring.  Laboratory results. TREATMENT  First, knowing the cause of the hyperglycemia is important before the hyperglycemia can be treated. Treatment may include, but is not be limited to:  Education.  Change or adjustment in medications.  Change or adjustment in meal plan.  Treatment for an illness, infection, etc.  More frequent blood glucose monitoring.  Change in exercise plan.  Decreasing or stopping steroids.  Lifestyle changes. HOME CARE INSTRUCTIONS   Test your blood glucose as directed.  Exercise regularly. Your caregiver will give you instructions about exercise. Pre-diabetes or diabetes which comes on with stress is helped by exercising.  Eat wholesome, balanced meals. Eat often and at regular, fixed times. Your caregiver or nutritionist will give you a meal plan to guide your sugar intake.  Being at an ideal weight is important. If needed, losing as little as 10 to 15 pounds may help improve blood glucose levels. SEEK MEDICAL CARE IF:   You have questions about medicine, activity, or diet.  You continue to have symptoms (problems such as increased thirst, urination, or weight gain). SEEK IMMEDIATE MEDICAL CARE IF:   You are vomiting or have diarrhea.  Your breath smells fruity.  You are breathing faster or slower.  You are very sleepy or incoherent.  You have numbness, tingling, or pain in your feet or hands.  You have chest pain.  Your symptoms get worse even though you have been following your caregiver's orders.  If you have any other questions or concerns. Document Released: 08/07/2000 Document Revised: 05/06/2011 Document Reviewed: 06/10/2011 [facility] Patient Information 2015 [place], [place]. This information is not intended to replace advice given to  you by your health care provider. Make sure you discuss any questions you have with your health care provider.

## 2014-07-29 NOTE — ED Notes (Signed)
Pt ambulating to the bathroom

## 2014-07-29 NOTE — ED Notes (Signed)
Pt reports abd pain, n/v, and gas since Monday. Also having some lower back pain. States she has been unable to keep much down. Denies any urinary symptoms.

## 2014-09-08 ENCOUNTER — Encounter (HOSPITAL_COMMUNITY): Payer: Self-pay | Admitting: *Deleted

## 2014-09-08 ENCOUNTER — Emergency Department (HOSPITAL_COMMUNITY)
Admission: EM | Admit: 2014-09-08 | Discharge: 2014-09-08 | Discharge status: Against Medical Advice | Payer: Medicaid Other | Attending: Emergency Medicine | Admitting: Emergency Medicine

## 2014-09-08 ENCOUNTER — Emergency Department (HOSPITAL_COMMUNITY)
Admission: EM | Admit: 2014-09-08 | Discharge: 2014-09-08 | Disposition: A | Discharge status: Other Acute Inpatient Hospital | Payer: Medicaid Other | Source: Home / Self Care | Attending: Family Medicine | Admitting: Family Medicine

## 2014-09-08 DIAGNOSIS — M545 Low back pain: Secondary | ICD-10-CM | POA: Insufficient documentation

## 2014-09-08 DIAGNOSIS — E119 Type 2 diabetes mellitus without complications: Secondary | ICD-10-CM | POA: Insufficient documentation

## 2014-09-08 DIAGNOSIS — E860 Dehydration: Secondary | ICD-10-CM

## 2014-09-08 DIAGNOSIS — R739 Hyperglycemia, unspecified: Secondary | ICD-10-CM | POA: Diagnosis not present

## 2014-09-08 DIAGNOSIS — R42 Dizziness and giddiness: Secondary | ICD-10-CM | POA: Diagnosis present

## 2014-09-08 DIAGNOSIS — J45909 Unspecified asthma, uncomplicated: Secondary | ICD-10-CM | POA: Insufficient documentation

## 2014-09-08 LAB — POCT I-STAT, CHEM 8
BUN: 12 mg/dL (ref 6–20)
CREATININE: 0.8 mg/dL (ref 0.44–1.00)
Calcium, Ion: 0.94 mmol/L — ABNORMAL LOW (ref 1.12–1.23)
Chloride: 94 mmol/L — ABNORMAL LOW (ref 101–111)
Glucose, Bld: 422 mg/dL — ABNORMAL HIGH (ref 65–99)
HEMATOCRIT: 42 % (ref 36.0–46.0)
Hemoglobin: 14.3 g/dL (ref 12.0–15.0)
Potassium: 3.4 mmol/L — ABNORMAL LOW (ref 3.5–5.1)
Sodium: 130 mmol/L — ABNORMAL LOW (ref 135–145)
TCO2: 24 mmol/L (ref 0–100)

## 2014-09-08 LAB — URINE MICROSCOPIC-ADD ON

## 2014-09-08 LAB — URINALYSIS, ROUTINE W REFLEX MICROSCOPIC
Bilirubin Urine: NEGATIVE
Glucose, UA: >1000 mg/dL — AB
HGB URINE DIPSTICK: NEGATIVE
Ketones, ur: 15 mg/dL — AB
NITRITE: NEGATIVE
PH: 5 (ref 5.0–8.0)
PROTEIN: NEGATIVE mg/dL
SPECIFIC GRAVITY, URINE: 1.022 (ref 1.005–1.030)
Urobilinogen, UA: 0.2 mg/dL (ref 0.0–1.0)

## 2014-09-08 MED ORDER — ONDANSETRON 4 MG PO TBDP
4.0000 mg | ORAL_TABLET | Freq: Once | ORAL | Status: AC
  Administered 2014-09-08: 4 mg via ORAL

## 2014-09-08 MED ORDER — ONDANSETRON 4 MG PO TBDP
ORAL_TABLET | ORAL | Status: AC
  Filled 2014-09-08: qty 1

## 2014-09-08 NOTE — ED Notes (Signed)
Pt has decided to leave AMA.  Advised pt of delay.

## 2014-09-08 NOTE — ED Notes (Signed)
PT refused labs

## 2014-09-08 NOTE — ED Notes (Addendum)
Pt with lower back pain and dizziness x 2 days.  States has been unable to take metformin d/t nausea since Tuesday.  Sent from [facility].  Pt denies having diarrhea as was charted at [facility].  Glucose from I-stat drawn at [facility] was 422.

## 2014-09-08 NOTE — ED Provider Notes (Signed)
CSN: [id number]     Arrival date & time 09/08/14  1300 History   First MD Initiated Contact with Patient 09/08/14 1322     Chief Complaint  Patient presents with  . Dizziness   (Consider location/radiation/quality/duration/timing/severity/associated sxs/prior Treatment) Patient is a 49 y.o. female presenting with dizziness. The history is provided by the patient. No language interpreter was used.  Dizziness Quality:  Lightheadedness Severity:  Moderate Onset quality:  Gradual Timing:  Constant Progression:  Worsening Chronicity:  New Context: bending over and head movement   Relieved by:  Nothing Worsened by:  Being still Associated symptoms: nausea and vomiting   Pt reports she has been vomiting and having diarrhea.  Pt uanble to take her metformin.  Pt feels like she is going to pass out  Past Medical History  Diagnosis Date  . Diabetes mellitus   . Asthma     Child   Past Surgical History  Procedure Laterality Date  . Cholecystectomy     Family History  Problem Relation Age of Onset  . Diabetes Mother    History  Substance Use Topics  . Smoking status: Former Smoker -- 0.00 packs/day  . Smokeless tobacco: Not on file     Comment: Smoked when she was a teenager.   . Alcohol Use: Yes   OB History    No data available     Review of Systems  Gastrointestinal: Positive for nausea and vomiting.  Neurological: Positive for dizziness.  All other systems reviewed and are negative.   Allergies  Penicillins  Home Medications   Prior to Admission medications   Medication Sig Start Date End Date Taking? Authorizing Provider  cephALEXin (KEFLEX) 500 MG capsule Take 1 capsule (500 mg total) by mouth 4 (four) times daily. 07/29/14   Comer Locket, [name]  ibuprofen (ADVIL,MOTRIN) 800 MG tablet Take 1 tablet (800 mg total) by mouth 3 (three) times daily. 12/13/13   Domenic Moras, [name]  loratadine (CLARITIN) 10 MG tablet Take 1 tablet (10 mg total) by mouth daily. 03/08/14    Gareth Morgan, MD  metFORMIN (GLUCOPHAGE) 500 MG tablet Take 1 tablet (500 mg total) by mouth 2 (two) times daily with a meal. 07/29/14   Comer Locket, [name]  polyethylene glycol powder (GLYCOLAX/MIRALAX) powder Take 17 g by mouth 2 (two) times daily. Until daily soft stools  OTC 07/29/14   Comer Locket, [name]   LMP 08/20/2014 Physical Exam  Constitutional: She is oriented to person, place, and time. She appears well-developed and well-nourished.  HENT:  Head: Normocephalic and atraumatic.  Eyes: EOM are normal.  Neck: Normal range of motion.  Cardiovascular:  tachycardia  Pulmonary/Chest: Effort normal.  Abdominal: She exhibits no distension.  Musculoskeletal: Normal range of motion.  Neurological: She is alert and oriented to person, place, and time.  Skin: Skin is warm.  Psychiatric:  Flat affect,   Nursing note and vitals reviewed.   ED Course  Procedures (including critical care time) Labs Review Labs Reviewed - No data to display  Imaging Review No results found.   MDM  Pt is orthostatic with low sodium and elevated glucose.   Pt to go to ED for evaluation   1. Dehydration   2. Hyperglycemia        Fransico Meadow, [name] 09/08/14 1347

## 2014-09-08 NOTE — ED Notes (Signed)
Pt  Reports   Symptoms     Of  Nausea      Dizzy    Nausea        Weak     Light  Headed            Back  Pain  Pt  Is  A  Diabetic           Has  Not       Taken         Her   meds  In  Several  Days          Pt  Drove      Herself to the  Urgent  Care

## 2014-09-08 NOTE — ED Notes (Signed)
Family at bedside. 

## 2014-09-16 ENCOUNTER — Encounter (HOSPITAL_COMMUNITY): Payer: Self-pay | Admitting: *Deleted

## 2014-09-16 ENCOUNTER — Emergency Department (HOSPITAL_COMMUNITY)
Admission: EM | Admit: 2014-09-16 | Discharge: 2014-09-16 | Disposition: A | Discharge status: Home / Self Care | Payer: Medicaid Other | Attending: Emergency Medicine | Admitting: Emergency Medicine

## 2014-09-16 DIAGNOSIS — Z880 Allergy status to penicillin: Secondary | ICD-10-CM | POA: Diagnosis not present

## 2014-09-16 DIAGNOSIS — R51 Headache: Secondary | ICD-10-CM | POA: Insufficient documentation

## 2014-09-16 DIAGNOSIS — J45909 Unspecified asthma, uncomplicated: Secondary | ICD-10-CM | POA: Insufficient documentation

## 2014-09-16 DIAGNOSIS — H9202 Otalgia, left ear: Secondary | ICD-10-CM | POA: Insufficient documentation

## 2014-09-16 DIAGNOSIS — K047 Periapical abscess without sinus: Secondary | ICD-10-CM | POA: Diagnosis present

## 2014-09-16 DIAGNOSIS — R509 Fever, unspecified: Secondary | ICD-10-CM | POA: Insufficient documentation

## 2014-09-16 DIAGNOSIS — Z87891 Personal history of nicotine dependence: Secondary | ICD-10-CM | POA: Insufficient documentation

## 2014-09-16 DIAGNOSIS — K029 Dental caries, unspecified: Secondary | ICD-10-CM | POA: Diagnosis not present

## 2014-09-16 DIAGNOSIS — Z791 Long term (current) use of non-steroidal anti-inflammatories (NSAID): Secondary | ICD-10-CM | POA: Diagnosis not present

## 2014-09-16 DIAGNOSIS — Z792 Long term (current) use of antibiotics: Secondary | ICD-10-CM | POA: Insufficient documentation

## 2014-09-16 DIAGNOSIS — E119 Type 2 diabetes mellitus without complications: Secondary | ICD-10-CM | POA: Diagnosis not present

## 2014-09-16 DIAGNOSIS — Z79899 Other long term (current) drug therapy: Secondary | ICD-10-CM | POA: Insufficient documentation

## 2014-09-16 MED ORDER — CLINDAMYCIN HCL 150 MG PO CAPS: 300.0000 mg | ORAL_CAPSULE | Freq: Three times a day (TID) | ORAL | Status: DC

## 2014-09-16 MED ORDER — HYDROCODONE-ACETAMINOPHEN 5-325 MG PO TABS: 1.0000 | ORAL_TABLET | Freq: Four times a day (QID) | ORAL | Status: DC | PRN

## 2014-09-16 NOTE — Discharge Instructions (Signed)
Call Dr. Jackson Latino office in the morning and tell them you were seen in the ED and need follow up.   Dental Abscess A dental abscess is a collection of infected fluid (pus) from a bacterial infection in the inner part of the tooth (pulp). It usually occurs at the end of the tooth's root.  CAUSES   Severe tooth decay.  Trauma to the tooth that allows bacteria to enter into the pulp, such as a broken or chipped tooth. SYMPTOMS   Severe pain in and around the infected tooth.  Swelling and redness around the abscessed tooth or in the mouth or face.  Tenderness.  Pus drainage.  Bad breath.  Bitter taste in the mouth.  Difficulty swallowing.  Difficulty opening the mouth.  Nausea.  Vomiting.  Chills.  Swollen neck glands. DIAGNOSIS   A medical and dental history will be taken.  An examination will be performed by tapping on the abscessed tooth.  X-rays may be taken of the tooth to identify the abscess. TREATMENT The goal of treatment is to eliminate the infection. You may be prescribed antibiotic medicine to stop the infection from spreading. A root canal may be performed to save the tooth. If the tooth cannot be saved, it may be pulled (extracted) and the abscess may be drained.  HOME CARE INSTRUCTIONS  Only take over-the-counter or prescription medicines for pain, fever, or discomfort as directed by your caregiver.  Rinse your mouth (gargle) often with salt water ( tsp salt in 8 oz [250 ml] of warm water) to relieve pain or swelling.  Do not drive after taking pain medicine (narcotics).  Do not apply heat to the outside of your face.  Return to your dentist for further treatment as directed. SEEK MEDICAL CARE IF:  Your pain is not helped by medicine.  Your pain is getting worse instead of better. SEEK IMMEDIATE MEDICAL CARE IF:  You have a fever or persistent symptoms for more than 2-3 days.  You have a fever and your symptoms suddenly get worse.  You have  chills or a very bad headache.  You have problems breathing or swallowing.  You have trouble opening your mouth.  You have swelling in the neck or around the eye. Document Released: 02/11/2005 Document Revised: 11/06/2011 Document Reviewed: 05/22/2010 [facility] Patient Information 2015 [place], [place]. This information is not intended to replace advice given to you by your health care provider. Make sure you discuss any questions you have with your health care provider.  Dental Care and Dentist Visits Dental care supports good overall health. Regular dental visits can also help you avoid dental pain, bleeding, infection, and other more serious health problems in the future. It is important to keep the mouth healthy because diseases in the teeth, gums, and other oral tissues can spread to other areas of the body. Some problems, such as diabetes, heart disease, and pre-term labor have been associated with poor oral health.  See your dentist every 6 months. If you experience emergency problems such as a toothache or broken tooth, go to the dentist right away. If you see your dentist regularly, you may catch problems early. It is easier to be treated for problems in the early stages.  WHAT TO EXPECT AT A DENTIST VISIT  Your dentist will look for many common oral health problems and recommend proper treatment. At your regular dental visit, you can expect:  Gentle cleaning of the teeth and gums. This includes scraping and polishing. This helps to  remove the sticky substance around the teeth and gums (plaque). Plaque forms in the mouth shortly after eating. Over time, plaque hardens on the teeth as tartar. If tartar is not removed regularly, it can cause problems. Cleaning also helps remove stains.  Periodic X-rays. These pictures of the teeth and supporting bone will help your dentist assess the health of your teeth.  Periodic fluoride treatments. Fluoride is a natural mineral shown to help strengthen  teeth. Fluoride treatmentinvolves applying a fluoride gel or varnish to the teeth. It is most commonly done in children.  Examination of the mouth, tongue, jaws, teeth, and gums to look for any oral health problems, such as:  Cavities (dental caries). This is decay on the tooth caused by plaque, sugar, and acid in the mouth. It is best to catch a cavity when it is small.  Inflammation of the gums caused by plaque buildup (gingivitis).  Problems with the mouth or malformed or misaligned teeth.  Oral cancer or other diseases of the soft tissues or jaws. KEEP YOUR TEETH AND GUMS HEALTHY For healthy teeth and gums, follow these general guidelines as well as your dentist's specific advice:  Have your teeth professionally cleaned at the dentist every 6 months.  Brush twice daily with a fluoride toothpaste.  Floss your teeth daily.  Ask your dentist if you need fluoride supplements, treatments, or fluoride toothpaste.  Eat a healthy diet. Reduce foods and drinks with added sugar.  Avoid smoking. TREATMENT FOR ORAL HEALTH PROBLEMS If you have oral health problems, treatment varies depending on the conditions present in your teeth and gums.  Your caregiver will most likely recommend good oral hygiene at each visit.  For cavities, gingivitis, or other oral health disease, your caregiver will perform a procedure to treat the problem. This is typically done at a separate appointment. Sometimes your caregiver will refer you to another dental specialist for specific tooth problems or for surgery. SEEK IMMEDIATE DENTAL CARE IF:  You have pain, bleeding, or soreness in the gum, tooth, jaw, or mouth area.  A permanent tooth becomes loose or separated from the gum socket.  You experience a blow or injury to the mouth or jaw area. Document Released: 10/24/2010 Document Revised: 05/06/2011 Document Reviewed: 10/24/2010 [facility] Patient Information 2015 [place], [place]. This information is not  intended to replace advice given to you by your health care provider. Make sure you discuss any questions you have with your health care provider.

## 2014-09-16 NOTE — ED Notes (Signed)
Pt c/o abscess in her left lower gums. Pt noticed the abscess three days ago.

## 2014-09-16 NOTE — ED Provider Notes (Signed)
CSN: [id number]     Arrival date & time 09/16/14  1546 History   This chart was scribed for Natalie Baller, NP working with Natalie Gambler, MD by Randa Evens, ED Scribe. This patient was seen in room TR08C/TR08C and the patient's care was started at 4:01 PM.     Chief Complaint  Patient presents with  . Abscess   Patient is a 49 y.o. female presenting with abscess. The history is provided by the patient. No language interpreter was used.  Abscess Location:  Mouth Mouth abscess location:  L inner cheek Abscess quality: painful   Red streaking: no   Duration:  3 days Pain details:    Severity:  Moderate   Duration:  3 days   Timing:  Constant Chronicity:  New Relieved by:  Nothing Ineffective treatments:  NSAIDs Associated symptoms: fever and headaches   Associated symptoms: no nausea and no vomiting    HPI Comments: [name] is a 49 y.o. female who presents to the Emergency Department complaining of left sided lower dental abscess onset 3 days prior. Pt states she has had intermittent subjective fever, left ear pain and HA. Pt states that the pain is radiating up into her left temporal. Pt states that she has tried tylenol, ibuprofen, peroxide gargles, and Listerine rinses with no relief. Pt states that eating cold food makes the pain worse. Pt denies nausea or vomiting.     Past Medical History  Diagnosis Date  . Diabetes mellitus   . Asthma     Child   Past Surgical History  Procedure Laterality Date  . Cholecystectomy     Family History  Problem Relation Age of Onset  . Diabetes Mother    History  Substance Use Topics  . Smoking status: Former Smoker -- 0.00 packs/day  . Smokeless tobacco: Not on file     Comment: Smoked when she was a teenager.   . Alcohol Use: Yes   OB History    No data available     Review of Systems  Constitutional: Positive for fever.  HENT: Positive for dental problem, ear pain and facial swelling.   Gastrointestinal: Negative  for nausea and vomiting.  Neurological: Positive for headaches.  All other systems reviewed and are negative.    Allergies  Penicillins  Home Medications   Prior to Admission medications   Medication Sig Start Date End Date Taking? Authorizing Provider  cephALEXin (KEFLEX) 500 MG capsule Take 1 capsule (500 mg total) by mouth 4 (four) times daily. 07/29/14   Comer Locket, [name]  clindamycin (CLEOCIN) 150 MG capsule Take 2 capsules (300 mg total) by mouth 3 (three) times daily. 09/16/14   Hope Bunnie Pion, NP  HYDROcodone-acetaminophen (NORCO) 5-325 MG per tablet Take 1 tablet by mouth every 6 (six) hours as needed. 09/16/14   Hope Bunnie Pion, NP  ibuprofen (ADVIL,MOTRIN) 800 MG tablet Take 1 tablet (800 mg total) by mouth 3 (three) times daily. 12/13/13   Domenic Moras, [name]  loratadine (CLARITIN) 10 MG tablet Take 1 tablet (10 mg total) by mouth daily. 03/08/14   Gareth Morgan, MD  metFORMIN (GLUCOPHAGE) 500 MG tablet Take 1 tablet (500 mg total) by mouth 2 (two) times daily with a meal. 07/29/14   Comer Locket, [name]  polyethylene glycol powder (GLYCOLAX/MIRALAX) powder Take 17 g by mouth 2 (two) times daily. Until daily soft stools  OTC 07/29/14   Comer Locket, [name]   BP 122/78 mmHg  Pulse 117  Temp(Src) 99.4 F (37.4  C) (Oral)  Resp 20  SpO2 97%  LMP 08/20/2014   Physical Exam  Constitutional: She is oriented to person, place, and time. She appears well-developed and well-nourished. No distress.  HENT:  Head: Atraumatic.  Right Ear: Tympanic membrane normal. No mastoid tenderness.  Left Ear: Tympanic membrane normal. No mastoid tenderness.  multiple dental carries that extend into the gum line from the central incisor to the 2nd molar on left lower dental area. Abscess present at the gum of the first molar.Tender on exam. Mild swelling to thew left jaw.   Eyes: Conjunctivae and EOM are normal.  Neck: Neck supple. No tracheal deviation present.  Cardiovascular: Normal rate.    Pulmonary/Chest: Effort normal. No respiratory distress.  Musculoskeletal: Normal range of motion.  Lymphadenopathy:    She has cervical adenopathy.  Neurological: She is alert and oriented to person, place, and time.  Skin: Skin is warm and dry.  Psychiatric: She has a normal mood and affect. Her behavior is normal.  Nursing note and vitals reviewed.   ED Course  Procedures (including critical care time) DIAGNOSTIC STUDIES: Oxygen Saturation is 97% on RA, normal by my interpretation.    COORDINATION OF CARE: 4:11 PM-Discussed treatment plan with pt at bedside and pt agreed to plan.    MDM  49 y.o. female with dental abscess and multiple dental caries. Stable for d/c without fever and does not appear toxic. Dental on call referral given. Will start antibiotics and pain management. Discussed with the patient and all questioned fully answered.   Final diagnoses:  Dental abscess  Dental caries    I personally performed the services described in this documentation, which was scribed in my presence. The recorded information has been reviewed and is accurate.      [address] [place], [place] 09/16/14 [place], MD 09/17/14 [phone]

## 2015-03-03 ENCOUNTER — Encounter (HOSPITAL_COMMUNITY): Payer: Self-pay | Admitting: Emergency Medicine

## 2015-03-03 ENCOUNTER — Inpatient Hospital Stay (HOSPITAL_COMMUNITY)
Admission: EM | Admit: 2015-03-03 | Discharge: 2015-03-06 | DRG: 871 | Disposition: A | Discharge status: Home / Self Care | Payer: Self-pay | Attending: Internal Medicine | Admitting: Internal Medicine

## 2015-03-03 DIAGNOSIS — Z7984 Long term (current) use of oral hypoglycemic drugs: Secondary | ICD-10-CM

## 2015-03-03 DIAGNOSIS — E1110 Type 2 diabetes mellitus with ketoacidosis without coma: Secondary | ICD-10-CM | POA: Diagnosis present

## 2015-03-03 DIAGNOSIS — N179 Acute kidney failure, unspecified: Secondary | ICD-10-CM | POA: Diagnosis present

## 2015-03-03 DIAGNOSIS — E1129 Type 2 diabetes mellitus with other diabetic kidney complication: Secondary | ICD-10-CM | POA: Diagnosis present

## 2015-03-03 DIAGNOSIS — N390 Urinary tract infection, site not specified: Secondary | ICD-10-CM

## 2015-03-03 DIAGNOSIS — A419 Sepsis, unspecified organism: Principal | ICD-10-CM | POA: Diagnosis present

## 2015-03-03 DIAGNOSIS — E871 Hypo-osmolality and hyponatremia: Secondary | ICD-10-CM | POA: Diagnosis present

## 2015-03-03 DIAGNOSIS — Z833 Family history of diabetes mellitus: Secondary | ICD-10-CM

## 2015-03-03 DIAGNOSIS — N12 Tubulo-interstitial nephritis, not specified as acute or chronic: Secondary | ICD-10-CM | POA: Diagnosis present

## 2015-03-03 DIAGNOSIS — E876 Hypokalemia: Secondary | ICD-10-CM | POA: Diagnosis not present

## 2015-03-03 DIAGNOSIS — E1310 Other specified diabetes mellitus with ketoacidosis without coma: Secondary | ICD-10-CM | POA: Diagnosis present

## 2015-03-03 DIAGNOSIS — E1165 Type 2 diabetes mellitus with hyperglycemia: Secondary | ICD-10-CM | POA: Diagnosis present

## 2015-03-03 LAB — I-STAT VENOUS BLOOD GAS, ED
Acid-Base Excess: 2 mmol/L (ref 0.0–2.0)
Bicarbonate: 27.9 mEq/L — ABNORMAL HIGH (ref 20.0–24.0)
O2 Saturation: 68 %
PH VEN: 7.37 — AB (ref 7.250–7.300)
PO2 VEN: 37 mmHg (ref 30.0–45.0)
TCO2: 29 mmol/L (ref 0–100)
pCO2, Ven: 48.2 mmHg (ref 45.0–50.0)

## 2015-03-03 LAB — CBC
HCT: 32 % — ABNORMAL LOW (ref 36.0–46.0)
HEMOGLOBIN: 11 g/dL — AB (ref 12.0–15.0)
MCH: 30 pg (ref 26.0–34.0)
MCHC: 34.4 g/dL (ref 30.0–36.0)
MCV: 87.2 fL (ref 78.0–100.0)
PLATELETS: 478 10*3/uL — AB (ref 150–400)
RBC: 3.67 MIL/uL — ABNORMAL LOW (ref 3.87–5.11)
RDW: 12.5 % (ref 11.5–15.5)
WBC: 18.5 10*3/uL — AB (ref 4.0–10.5)

## 2015-03-03 LAB — COMPREHENSIVE METABOLIC PANEL
ALT: 12 U/L — ABNORMAL LOW (ref 14–54)
ANION GAP: 18 — AB (ref 5–15)
AST: 11 U/L — ABNORMAL LOW (ref 15–41)
Albumin: 2.6 g/dL — ABNORMAL LOW (ref 3.5–5.0)
Alkaline Phosphatase: 100 U/L (ref 38–126)
BILIRUBIN TOTAL: 0.9 mg/dL (ref 0.3–1.2)
BUN: 8 mg/dL (ref 6–20)
CO2: 23 mmol/L (ref 22–32)
Calcium: 9.1 mg/dL (ref 8.9–10.3)
Chloride: 88 mmol/L — ABNORMAL LOW (ref 101–111)
Creatinine, Ser: 1.24 mg/dL — ABNORMAL HIGH (ref 0.44–1.00)
GFR calc Af Amer: 59 mL/min — ABNORMAL LOW (ref >60)
GFR calc non Af Amer: 51 mL/min — ABNORMAL LOW (ref >60)
Glucose, Bld: 444 mg/dL — ABNORMAL HIGH (ref 65–99)
POTASSIUM: 3.5 mmol/L (ref 3.5–5.1)
Sodium: 129 mmol/L — ABNORMAL LOW (ref 135–145)
TOTAL PROTEIN: 8.1 g/dL (ref 6.5–8.1)

## 2015-03-03 LAB — URINE MICROSCOPIC-ADD ON

## 2015-03-03 LAB — I-STAT BETA HCG BLOOD, ED (MC, WL, AP ONLY): I-stat hCG, quantitative: <5 m[IU]/mL (ref <5)

## 2015-03-03 LAB — CBG MONITORING, ED
GLUCOSE-CAPILLARY: 358 mg/dL — AB (ref 65–99)
Glucose-Capillary: 389 mg/dL — ABNORMAL HIGH (ref 65–99)

## 2015-03-03 LAB — LIPASE, BLOOD: Lipase: 17 U/L (ref 11–51)

## 2015-03-03 LAB — URINALYSIS, ROUTINE W REFLEX MICROSCOPIC
Bilirubin Urine: NEGATIVE
Glucose, UA: >1000 mg/dL — AB
Ketones, ur: 40 mg/dL — AB
Nitrite: POSITIVE — AB
PROTEIN: 30 mg/dL — AB
Specific Gravity, Urine: 1.022 (ref 1.005–1.030)
pH: 5.5 (ref 5.0–8.0)

## 2015-03-03 LAB — I-STAT CG4 LACTIC ACID, ED: Lactic Acid, Venous: 1.22 mmol/L (ref 0.5–2.0)

## 2015-03-03 MED ORDER — INSULIN REGULAR BOLUS VIA INFUSION
0.0000 [IU] | Freq: Three times a day (TID) | INTRAVENOUS | Status: DC
  Filled 2015-03-03: qty 10

## 2015-03-03 MED ORDER — SODIUM CHLORIDE 0.9 % IV BOLUS (SEPSIS)
1000.0000 mL | Freq: Once | INTRAVENOUS | Status: AC
  Administered 2015-03-03: 1000 mL via INTRAVENOUS

## 2015-03-03 MED ORDER — SODIUM CHLORIDE 0.9 % IV SOLN
INTRAVENOUS | Status: DC
  Administered 2015-03-04: 2.2 [IU]/h via INTRAVENOUS
  Filled 2015-03-03: qty 2.5

## 2015-03-03 MED ORDER — INSULIN ASPART 100 UNIT/ML ~~LOC~~ SOLN
10.0000 [IU] | Freq: Once | SUBCUTANEOUS | Status: AC
  Administered 2015-03-03: 10 [IU] via INTRAVENOUS
  Filled 2015-03-03: qty 1

## 2015-03-03 MED ORDER — SODIUM CHLORIDE 0.9 % IV BOLUS (SEPSIS)
1000.0000 mL | Freq: Once | INTRAVENOUS | Status: AC
Start: 2015-03-03 — End: 2015-03-03
  Administered 2015-03-03: 1000 mL via INTRAVENOUS

## 2015-03-03 MED ORDER — DEXTROSE 5 % IV SOLN
1.0000 g | Freq: Once | INTRAVENOUS | Status: AC
  Administered 2015-03-03: 1 g via INTRAVENOUS
  Filled 2015-03-03: qty 10

## 2015-03-03 MED ORDER — SODIUM CHLORIDE 0.9 % IV BOLUS (SEPSIS)
500.0000 mL | Freq: Once | INTRAVENOUS | Status: AC
  Administered 2015-03-03: 500 mL via INTRAVENOUS

## 2015-03-03 MED ORDER — SODIUM CHLORIDE 0.9 % IV SOLN
INTRAVENOUS | Status: DC
  Administered 2015-03-04: 02:00:00 via INTRAVENOUS

## 2015-03-03 MED ORDER — DEXTROSE-NACL 5-0.45 % IV SOLN
INTRAVENOUS | Status: DC
  Administered 2015-03-04: 03:00:00 via INTRAVENOUS

## 2015-03-03 MED ORDER — DEXTROSE 50 % IV SOLN: 25.0000 mL | INTRAVENOUS | Status: DC | PRN

## 2015-03-03 NOTE — Progress Notes (Signed)
Pharmacy Code Sepsis Protocol  Time of code sepsis page: 2218 Time of antibiotic delivery: 2220  Were antibiotics ordered at the time of the code sepsis page? Yes Was it required to contact the physician? [x]  Physician not contacted []  Physician contacted to order antibiotics for code sepsis []  Physician contacted to recommend changing antibiotics  Pharmacy consulted for: n/a  Anti-infectives    Start     Dose/Rate Route Frequency Ordered Stop   03/03/15 2215  cefTRIAXone (ROCEPHIN) 1 g in dextrose 5 % 50 mL IVPB     1 g 100 mL/hr over 30 Minutes Intravenous  Once 03/03/15 2211        @infusions @   Nurse education provided: [x]  Minutes left to administer antibiotics to achieve 1 hour goal [x]  Correct order of antibiotic administration [x]  Antibiotic Y-site compatibilities     Andrey Cota. Diona Foley, PharmD, [place] Clinical Pharmacist Pager [phone]  03/03/2015, 10:21 PM

## 2015-03-03 NOTE — ED Provider Notes (Signed)
CSN: [id number]     Arrival date & time 03/03/15  2020 History   First MD Initiated Contact with Patient 03/03/15 2209     Chief Complaint  Patient presents with  . Abdominal Pain     (Consider location/radiation/quality/duration/timing/severity/associated sxs/prior Treatment) Patient is a 50 y.o. female presenting with abdominal pain. The history is provided by the patient.  Abdominal Pain Pain location:  L flank and R flank (back) Pain quality: aching   Pain radiates to:  Does not radiate Pain severity:  Moderate Onset quality:  Gradual Duration:  1 week Timing:  Constant Progression:  Unchanged Context: not recent illness   Context comment:  Uncontrolled diabetes Relieved by:  Nothing Worsened by:  Nothing tried Ineffective treatments:  None tried Associated symptoms: anorexia, fatigue, fever and nausea   Associated symptoms: no vaginal bleeding and no vaginal discharge   Risk factors: obesity     Past Medical History  Diagnosis Date  . Diabetes mellitus   . Asthma     Child   Past Surgical History  Procedure Laterality Date  . Cholecystectomy    . Tubal ligation     Family History  Problem Relation Age of Onset  . Diabetes Mother    Social History  Substance Use Topics  . Smoking status: Former Smoker -- 0.00 packs/day  . Smokeless tobacco: None     Comment: Smoked when she was a teenager.   . Alcohol Use: No   OB History    No data available     Review of Systems  Constitutional: Positive for fever and fatigue.  Gastrointestinal: Positive for nausea, abdominal pain and anorexia.  Genitourinary: Negative for vaginal bleeding and vaginal discharge.  All other systems reviewed and are negative.     Allergies  Penicillins  Home Medications   Prior to Admission medications   Medication Sig Start Date End Date Taking? Authorizing Provider  cephALEXin (KEFLEX) 500 MG capsule Take 1 capsule (500 mg total) by mouth 4 (four) times daily. 07/29/14    Comer Locket, [name]  clindamycin (CLEOCIN) 150 MG capsule Take 2 capsules (300 mg total) by mouth 3 (three) times daily. 09/16/14   Hope Bunnie Pion, NP  HYDROcodone-acetaminophen (NORCO) 5-325 MG per tablet Take 1 tablet by mouth every 6 (six) hours as needed. 09/16/14   Hope Bunnie Pion, NP  ibuprofen (ADVIL,MOTRIN) 800 MG tablet Take 1 tablet (800 mg total) by mouth 3 (three) times daily. 12/13/13   Domenic Moras, [name]  loratadine (CLARITIN) 10 MG tablet Take 1 tablet (10 mg total) by mouth daily. 03/08/14   Gareth Morgan, MD  metFORMIN (GLUCOPHAGE) 500 MG tablet Take 1 tablet (500 mg total) by mouth 2 (two) times daily with a meal. 07/29/14   Comer Locket, [name]  polyethylene glycol powder (GLYCOLAX/MIRALAX) powder Take 17 g by mouth 2 (two) times daily. Until daily soft stools  OTC 07/29/14   Comer Locket, [name]   BP 110/67 mmHg  Pulse 120  Temp(Src) 99.3 F (37.4 C) (Oral)  Resp 22  SpO2 99% Physical Exam  Constitutional: She is oriented to person, place, and time. She appears well-developed and well-nourished. No distress.  HENT:  Head: Normocephalic.  Eyes: Conjunctivae are normal.  Neck: Neck supple. No tracheal deviation present.  Cardiovascular: Regular rhythm and normal heart sounds.  Tachycardia present.   Pulmonary/Chest: Effort normal and breath sounds normal. No respiratory distress.  Abdominal: Soft. She exhibits no distension. There is tenderness in the suprapubic area. There is CVA tenderness (  bilaterall L>R). There is no rigidity, no guarding, no tenderness at McBurney's point and negative Murphy's sign.  Obese abdomen  Neurological: She is alert and oriented to person, place, and time.  Skin: Skin is warm and dry.  Psychiatric: She has a normal mood and affect.    ED Course  Procedures (including critical care time)  Procedure note: Ultrasound Guided Peripheral IV Ultrasound guided peripheral 1.88 inch angiocath IV placement performed by me. Indications: Nursing  unable to place IV. Details: The antecubital fossa and upper arm were evaluated with a multifrequency linear probe. Patent brachial veins were noted. 1 attempt was made to cannulate a vein under realtime US guidance with successful cannulation of the vein and catheter placement. There is return of non-pulsatile dark red blood. The patient tolerated the procedure well without complications. Images archived electronically.  CPT codes: [phone] and [id number]   Labs Review Labs Reviewed  COMPREHENSIVE METABOLIC PANEL - Abnormal; Notable for the following:    Sodium 129 (*)    Chloride 88 (*)    Glucose, Bld 444 (*)    Creatinine, Ser 1.24 (*)    Albumin 2.6 (*)    AST 11 (*)    ALT 12 (*)    GFR calc non Af Amer 51 (*)    GFR calc Af Amer 59 (*)    Anion gap 18 (*)    All other components within normal limits  CBC - Abnormal; Notable for the following:    WBC 18.5 (*)    RBC 3.67 (*)    Hemoglobin 11.0 (*)    HCT 32.0 (*)    Platelets 478 (*)    All other components within normal limits  URINALYSIS, ROUTINE W REFLEX MICROSCOPIC (NOT AT [facility]) - Abnormal; Notable for the following:    APPearance CLOUDY (*)    Glucose, UA >1000 (*)    Hgb urine dipstick LARGE (*)    Ketones, ur 40 (*)    Protein, ur 30 (*)    Nitrite POSITIVE (*)    Leukocytes, UA MODERATE (*)    All other components within normal limits  URINE MICROSCOPIC-ADD ON - Abnormal; Notable for the following:    Squamous Epithelial / LPF 0-5 (*)    Bacteria, UA FEW (*)    All other components within normal limits  CBG MONITORING, ED - Abnormal; Notable for the following:    Glucose-Capillary 389 (*)    All other components within normal limits  URINE CULTURE  CULTURE, BLOOD (ROUTINE X 2)  CULTURE, BLOOD (ROUTINE X 2)  LIPASE, BLOOD  I-STAT BETA HCG BLOOD, ED ([facility], [facility], [facility] ONLY)  I-STAT CG4 LACTIC ACID, ED    Imaging Review No results found. I have personally reviewed and evaluated these images and lab results as part of my  medical decision-making.   EKG Interpretation None      MDM   Final diagnoses:  Sepsis, due to unspecified organism [facility])  Urinary tract infection without hematuria, site unspecified   50 y.o. female presents with symptoms c/w pyelonephritis. Patient meets clinical criteria for sepsis with at least 2 SIRS and suspected source of UTI with heavy pyuria. Fluid resuscitated with multiple boluses. Given IV insulin for uncontrolled hyperglycemia and borderline DKA. Renal function is near baseline. Initiated IV rocephin pending cultures. Hospitalist was consulted for admission and will see the patient in the emergency department.      Leo Grosser, MD 03/04/15 1320

## 2015-03-03 NOTE — ED Notes (Signed)
[facility] contacted to page Code Sepsis 

## 2015-03-03 NOTE — ED Notes (Signed)
Admitting at bedside 

## 2015-03-03 NOTE — ED Notes (Signed)
Pt stuck by two RN for IV, unsuccessful

## 2015-03-03 NOTE — H&P (Signed)
History and Physical  Patient Name: [name]     [id number]    DOB: 11-21-1965    DOA: 03/03/2015 Referring physician: Frances Nickels, MD PCP: Minerva Ends, MD      Chief Complaint: Flank pain and fever  HPI: [name] is a 50 y.o. female with a past medical history significant for NIDDM who presents with flank pain for 1 week.   The patient was in her usual state of health until about one week ago when she developed left flank pain. This was an aching pain, moderate to severe in intensity and constant all the time. This pain progressed, she developed fever and chills intermittently, so she came to the ER.  She denied dysuria, hematuria, or other LUTS.  She denied cough, sputum, dyspnea.  In the ED, shehad a low-grade fever, tachycardia, tachypnea, and elevated WBC. Acid level is normal. The urinalysis showed copious pyuria bacteria and positive nitrites. She had mild AKI, hyperglycemia, and elevated anion gap with normal pH. Blood and urine cultures were obtained.  Fluids, ceftriaxone, and insulin were administered and TRH were asked to evaluate for admission.     Review of Systems:  Pt complains of flank pain, nausea, chills. Pt denies any vomiting, diarrhea, abdominal pain, cough sputum, dysuria, other urinary symptoms.  All other systems negative except as just noted or noted in the history of present illness.  Allergies  Allergen Reactions  . Penicillins Rash    Prior to Admission medications   Medication Sig Start Date End Date Taking? Authorizing Provider  HYDROcodone-acetaminophen (NORCO) 5-325 MG per tablet Take 1 tablet by mouth every 6 (six) hours as needed. Patient taking differently: Take 1 tablet by mouth every 6 (six) hours as needed for moderate pain.  09/16/14  Yes Hope Bunnie Pion, NP  ibuprofen (ADVIL,MOTRIN) 800 MG tablet Take 1 tablet (800 mg total) by mouth 3 (three) times daily. Patient taking differently: Take 800 mg by mouth every 8 (eight) hours  as needed for moderate pain.  12/13/13  Yes Domenic Moras, [name]  loratadine (CLARITIN) 10 MG tablet Take 1 tablet (10 mg total) by mouth daily. Patient taking differently: Take 10 mg by mouth daily as needed for allergies.  03/08/14  Yes Gareth Morgan, MD  metFORMIN (GLUCOPHAGE) 500 MG tablet Take 1 tablet (500 mg total) by mouth 2 (two) times daily with a meal. 07/29/14  Yes Comer Locket, [name]  polyethylene glycol powder (GLYCOLAX/MIRALAX) powder Take 17 g by mouth 2 (two) times daily. Until daily soft stools  OTC Patient taking differently: Take 17 g by mouth daily as needed for mild constipation. Until daily soft stools  OTC 07/29/14  Yes Comer Locket, [name]    Past Medical History  Diagnosis Date  . Diabetes mellitus   . Asthma     Child    Past Surgical History  Procedure Laterality Date  . Cholecystectomy    . Tubal ligation      Family history: family history includes Diabetes in her mother.  Social History: Patient lives with her father in law for the last month.  She denies smoking history to me.  She denies alcohol.  She previously lived with her boyfriend.  She is independent with all ADLs and IADLs and has no physical disabilities.  She does not work.        Physical Exam: BP 151/71 mmHg  Pulse 114  Temp(Src) 99.3 F (37.4 C) (Rectal)  Resp 22  Ht 5' 6.5" (1.689 m)  [employer] [facility]  97.4 kg (214 lb 11.7 oz)  BMI 34.14 kg/m2  SpO2 97% General appearance: Well-developed, obese adult female, alert and in mild discomfort from illness.   Eyes: Anicteric, conjunctiva pink, lids and lashes normal.     ENT: No nasal deformity, discharge, or epistaxis.  OP moist without lesions.  Teeth cracked. Lymph: No cervical, supraclavicular lymphadenopathy. Skin: Warm and dry.   Cardiac: Tachcardic, regular, nl S1-S2, no murmurs appreciated.  Capillary refill is brisk.  No LE edema.  Radial and DP pulses 3+ and symmetric. Respiratory: Normal respiratory rate and rhythm.  CTAB without rales  or wheezes. Abdomen: Abdomen soft without rigidity.  Mild diffuse TTP. No ascites, distension.   MSK: No deformities or effusions.  Left CVA tenderness. Neuro: Sensorium intact and responding to questions, attention normal.  Speech is fluent.  Moves all extremities equally and with normal coordination.    Psych:  Affect very flat and psychomotor slowing.  No evidence of aural or visual hallucinations or delusions.       Labs on Admission:  The metabolic panel shows hyponatremia which corrects to normal for hyperglycemia. Creatinine 1.24 mg/dL, with previous baseline 0.8. VBG shows elevated pH, bicarbonate normal. AG elevated. Transaminases and bilirubin normal. UA shows pyuria, bacteriuria, positive nitrates, glucosuria. Lipase is normal. The pregnancy test is negative. The complete blood count shows leukocytosis 18.5K/UL, chronic normocytic anemia, elevated platelet count.   Radiological Exams on Admission: Personally reviewed: Ct Renal Stone Study 03/04/2015  "Mild soft tissue inflammation is noted about the left kidney, concerning for mild acute pyelonephritis. There is no evidence of hydronephrosis. No renal or ureteral stones are identified. Minimal right-sided perinephric stranding is noted. The right kidney is otherwise unremarkable. No free fluid is identified."        Assessment/Plan 1. Pyelonephritis:  This is new.  I do not feel the patient meets sepsis criteria and that her elevated creatinine does not reflect organ failure from sepsis, but she wil be monitored closely.   -I recommend admission to telemetry -Ceftriaxone  -IV fluids -Follow urine culture and blood cultures -I have obtained a CT renal protocol to rule out stone given degree of flank pain, which shows no stone or hydronephrosis   2. NIDDM:  Patient currently takes no medicines.  Elevated anion gap without acidosis. -Insulin gtt -Repeat BMP in 4 hours -Transition back to SSI or oral metformin when  normoglycemic  3. Elevated creatinine: Suspect pre-renal injury.   -Trend BMP with fluids    DVT PPx: Lovenox Diet: Diabetic Consultants: None Code Status: Full Family Communication: None  Medical decision making: What exists of the patient's previous chart was reviewed in depth and the case was discussed with Dr. Laneta Simmers. Patient seen 11:15 PM on 03/03/2015.  Disposition Plan:  Admit for pyelonephritis and early systemic inflammation tachycardia, mild AKI.  Trend electrolytes and monitor on telemetry.        Edwin Dada [facility] Pager [phone]

## 2015-03-03 NOTE — ED Notes (Signed)
Patient arrives with complaint of "a swollen appendix". When asked where her pain is, patient pointed to left lateral aspect of her abdomen. Denies history of similar pain. Denies fever. CBG 389 in triage.

## 2015-03-04 ENCOUNTER — Inpatient Hospital Stay (HOSPITAL_COMMUNITY): Payer: Medicaid Other

## 2015-03-04 DIAGNOSIS — N12 Tubulo-interstitial nephritis, not specified as acute or chronic: Secondary | ICD-10-CM

## 2015-03-04 DIAGNOSIS — A419 Sepsis, unspecified organism: Principal | ICD-10-CM

## 2015-03-04 DIAGNOSIS — E871 Hypo-osmolality and hyponatremia: Secondary | ICD-10-CM

## 2015-03-04 DIAGNOSIS — E876 Hypokalemia: Secondary | ICD-10-CM

## 2015-03-04 DIAGNOSIS — E1310 Other specified diabetes mellitus with ketoacidosis without coma: Secondary | ICD-10-CM

## 2015-03-04 DIAGNOSIS — E1110 Type 2 diabetes mellitus with ketoacidosis without coma: Secondary | ICD-10-CM | POA: Diagnosis present

## 2015-03-04 DIAGNOSIS — E1165 Type 2 diabetes mellitus with hyperglycemia: Secondary | ICD-10-CM

## 2015-03-04 DIAGNOSIS — N179 Acute kidney failure, unspecified: Secondary | ICD-10-CM

## 2015-03-04 LAB — GLUCOSE, CAPILLARY
GLUCOSE-CAPILLARY: 246 mg/dL — AB (ref 65–99)
GLUCOSE-CAPILLARY: 264 mg/dL — AB (ref 65–99)
GLUCOSE-CAPILLARY: 217 mg/dL — AB (ref 65–99)
GLUCOSE-CAPILLARY: 277 mg/dL — AB (ref 65–99)
Glucose-Capillary: 158 mg/dL — ABNORMAL HIGH (ref 65–99)
Glucose-Capillary: 184 mg/dL — ABNORMAL HIGH (ref 65–99)
Glucose-Capillary: 221 mg/dL — ABNORMAL HIGH (ref 65–99)
Glucose-Capillary: 282 mg/dL — ABNORMAL HIGH (ref 65–99)

## 2015-03-04 LAB — CBC
HEMATOCRIT: 27.7 % — AB (ref 36.0–46.0)
Hemoglobin: 9.5 g/dL — ABNORMAL LOW (ref 12.0–15.0)
MCH: 29.8 pg (ref 26.0–34.0)
MCHC: 34.3 g/dL (ref 30.0–36.0)
MCV: 86.8 fL (ref 78.0–100.0)
Platelets: 447 10*3/uL — ABNORMAL HIGH (ref 150–400)
RBC: 3.19 MIL/uL — ABNORMAL LOW (ref 3.87–5.11)
RDW: 12.5 % (ref 11.5–15.5)
WBC: 17.7 10*3/uL — AB (ref 4.0–10.5)

## 2015-03-04 LAB — BASIC METABOLIC PANEL
Anion gap: 9 (ref 5–15)
BUN: 7 mg/dL (ref 6–20)
CHLORIDE: 103 mmol/L (ref 101–111)
CO2: 24 mmol/L (ref 22–32)
CREATININE: 0.78 mg/dL (ref 0.44–1.00)
Calcium: 7.9 mg/dL — ABNORMAL LOW (ref 8.9–10.3)
GFR calc Af Amer: >60 mL/min (ref >60)
GFR calc non Af Amer: >60 mL/min (ref >60)
GLUCOSE: 255 mg/dL — AB (ref 65–99)
POTASSIUM: 2.8 mmol/L — AB (ref 3.5–5.1)
Sodium: 136 mmol/L (ref 135–145)

## 2015-03-04 MED ORDER — ONDANSETRON HCL 4 MG/2ML IJ SOLN: 4.0000 mg | Freq: Four times a day (QID) | INTRAMUSCULAR | Status: DC | PRN

## 2015-03-04 MED ORDER — ACETAMINOPHEN 650 MG RE SUPP: 650.0000 mg | Freq: Four times a day (QID) | RECTAL | Status: DC | PRN

## 2015-03-04 MED ORDER — POTASSIUM CHLORIDE CRYS ER 20 MEQ PO TBCR
40.0000 meq | EXTENDED_RELEASE_TABLET | Freq: Once | ORAL | Status: AC
  Administered 2015-03-04: 40 meq via ORAL
  Filled 2015-03-04: qty 2

## 2015-03-04 MED ORDER — ONDANSETRON HCL 4 MG PO TABS
4.0000 mg | ORAL_TABLET | Freq: Four times a day (QID) | ORAL | Status: DC | PRN
Start: 2015-03-04 — End: 2015-03-06

## 2015-03-04 MED ORDER — ENOXAPARIN SODIUM 40 MG/0.4ML ~~LOC~~ SOLN
40.0000 mg | SUBCUTANEOUS | Status: DC
  Administered 2015-03-04 – 2015-03-06 (×3): 40 mg via SUBCUTANEOUS
  Filled 2015-03-04 (×3): qty 0.4

## 2015-03-04 MED ORDER — INSULIN GLARGINE 100 UNIT/ML ~~LOC~~ SOLN
10.0000 [IU] | Freq: Every day | SUBCUTANEOUS | Status: DC
  Administered 2015-03-04 – 2015-03-05 (×2): 10 [IU] via SUBCUTANEOUS
  Filled 2015-03-04 (×2): qty 0.1

## 2015-03-04 MED ORDER — POTASSIUM CHLORIDE CRYS ER 20 MEQ PO TBCR: 20.0000 meq | EXTENDED_RELEASE_TABLET | Freq: Two times a day (BID) | ORAL | Status: DC

## 2015-03-04 MED ORDER — HYDROCODONE-ACETAMINOPHEN 5-325 MG PO TABS
1.0000 | ORAL_TABLET | ORAL | Status: DC | PRN
  Administered 2015-03-05 (×2): 2 via ORAL
  Filled 2015-03-04 (×2): qty 2

## 2015-03-04 MED ORDER — ACETAMINOPHEN 325 MG PO TABS
650.0000 mg | ORAL_TABLET | Freq: Four times a day (QID) | ORAL | Status: DC | PRN
  Filled 2015-03-04: qty 2

## 2015-03-04 MED ORDER — INSULIN ASPART 100 UNIT/ML ~~LOC~~ SOLN
0.0000 [IU] | Freq: Three times a day (TID) | SUBCUTANEOUS | Status: DC
  Administered 2015-03-04: 5 [IU] via SUBCUTANEOUS
  Administered 2015-03-04: 3 [IU] via SUBCUTANEOUS
  Administered 2015-03-05: 8 [IU] via SUBCUTANEOUS
  Administered 2015-03-05: 5 [IU] via SUBCUTANEOUS
  Administered 2015-03-05 – 2015-03-06 (×2): 8 [IU] via SUBCUTANEOUS

## 2015-03-04 MED ORDER — SODIUM CHLORIDE 0.9 % IJ SOLN
3.0000 mL | Freq: Two times a day (BID) | INTRAMUSCULAR | Status: DC
  Administered 2015-03-04 – 2015-03-05 (×4): 3 mL via INTRAVENOUS

## 2015-03-04 MED ORDER — POTASSIUM CHLORIDE CRYS ER 20 MEQ PO TBCR
20.0000 meq | EXTENDED_RELEASE_TABLET | Freq: Two times a day (BID) | ORAL | Status: DC
Start: 2015-03-04 — End: 2015-03-04

## 2015-03-04 MED ORDER — DEXTROSE 5 % IV SOLN
1.0000 g | INTRAVENOUS | Status: DC
  Administered 2015-03-04 – 2015-03-05 (×2): 1 g via INTRAVENOUS
  Filled 2015-03-04 (×4): qty 10

## 2015-03-04 MED ORDER — INSULIN ASPART 100 UNIT/ML ~~LOC~~ SOLN
0.0000 [IU] | Freq: Every day | SUBCUTANEOUS | Status: DC
  Administered 2015-03-05: 3 [IU] via SUBCUTANEOUS

## 2015-03-04 NOTE — Progress Notes (Signed)
[facility] Progress Note  Patient: [name] [id number]   PCP: Minerva Ends, MD DOB: [birth year]   DOA: 03/03/2015   DOS: 03/04/2015   Date of Service: the patient was seen and examined on 03/04/2015  Subjective: Patient mentions her pain has improved. Denies any nausea vomiting abdominal pain. No chest pain. Nutrition: Able to tolerate diet Activity: At present bedridden Last BM: 03/03/2015  Assessment and Plan: 1. Pyelonephritis  Sepsis  Patient presents with complains of left-sided flank pain with fever or tachycardia, leucocytosis. CT abdomen showing mild left pyelonephritis. Also minimal right perinephric stranding. No stone no hydronephrosis. Continue ceftriaxone and follow cultures.  2.hypokalemia Hyponatremia AKI Sodium improved, replacing potassium  Renal function improved.  3. Type 2 diabetes mellitus   DKA on admission  Ketone positive in urine, anion gap elevated on admission, currently stable, will recheck. Now now on any insulin drip and sliding scale insulin has been initiated along with lantus.  Checking hemoglobin A1c.  DVT Prophylaxis: subcutaneous Heparin. Nutrition: diabetic diet Advance goals of care discussion: full code  Brief Summary of Hospitalization:  HPI: As per the H and P dictated on admission, "[name] is a 50 y.o. female with a past medical history significant for NIDDM who presents with flank pain for 1 week.   The patient was in her usual state of health until about one week ago when she developed left flank pain. This was an aching pain, moderate to severe in intensity and constant all the time. This pain progressed, she developed fever and chills intermittently, so she came to the ER. She denied dysuria, hematuria, or other LUTS. She denied cough, sputum, dyspnea.  In the ED, shehad a low-grade fever, tachycardia, tachypnea, and elevated WBC. Acid level is normal. The urinalysis showed copious pyuria bacteria and  positive nitrites. She had mild AKI, hyperglycemia, and elevated anion gap with normal pH. Blood and urine cultures were obtained. Fluids, ceftriaxone, and insulin were administered and TRH were asked to evaluate for admission." Daily update, Procedures: none Consultants: none Antibiotics: Anti-infectives    Start     Dose/Rate Route Frequency Ordered Stop   03/04/15 2200  cefTRIAXone (ROCEPHIN) 1 g in dextrose 5 % 50 mL IVPB     1 g 100 mL/hr over 30 Minutes Intravenous Every 24 hours 03/04/15 0112     03/03/15 2215  cefTRIAXone (ROCEPHIN) 1 g in dextrose 5 % 50 mL IVPB     1 g 100 mL/hr over 30 Minutes Intravenous  Once 03/03/15 2211 03/03/15 2346      Family Communication: no family was present at bedside, at the time of interview.   Disposition:  Expected discharge date:03/04/2014 Barriers to safe discharge: culture   Intake/Output Summary (Last 24 hours) at 03/04/15 1449 Last data filed at 03/04/15 0644  Gross per 24 hour  Intake    220 ml  Output      0 ml  Net    220 ml   Filed Weights   03/04/15 0105  Weight: 97.4 kg (214 lb 11.7 oz)    Objective: Physical Exam: Filed Vitals:   03/04/15 0007 03/04/15 0105 03/04/15 0515 03/04/15 0900  BP:  151/71 123/67 134/78  Pulse:  114 106 100  Temp: 100 F (37.8 C) 99.3 F (37.4 C) 99.6 F (37.6 C) 99.3 F (37.4 C)  TempSrc: Rectal   Oral  Resp:  22 22 22   Height:  5' 6.5" (1.689 m)    Weight:  97.4 kg (214 lb  11.7 oz)    SpO2:  97% 100% 100%     General: Appear in mild distress, no Rash; Oral Mucosa moist. Cardiovascular: S1 and S2 Present, no Murmur, no JVD Respiratory: Bilateral Air entry present and Clear to Auscultation, no Crackles, no wheezes Abdomen: Bowel Sound present, Soft and no tenderness Extremities: no Pedal edema, no calf tenderness Neurology: Grossly no focal neuro deficit.  Data Reviewed: CBC:  Recent Labs Lab 03/03/15 2103 03/04/15 0426  WBC 18.5* 17.7*  HGB 11.0* 9.5*  HCT 32.0* 27.7*    MCV 87.2 86.8  PLT 478* [id number]*   Basic Metabolic Panel:  Recent Labs Lab 03/03/15 2103 03/04/15 0426  NA 129* 136  K 3.5 2.8*  CL 88* 103  CO2 23 24  GLUCOSE 444* 255*  BUN 8 7  CREATININE 1.24* 0.78  CALCIUM 9.1 7.9*   Liver Function Tests:  Recent Labs Lab 03/03/15 2103  AST 11*  ALT 12*  ALKPHOS 100  BILITOT 0.9  PROT 8.1  ALBUMIN 2.6*    Recent Labs Lab 03/03/15 2103  LIPASE 17   No results for input(s): AMMONIA in the last 168 hours.  Cardiac Enzymes: No results for input(s): CKTOTAL, CKMB, CKMBINDEX, TROPONINI in the last 168 hours.  BNP (last 3 results) No results for input(s): BNP in the last 8760 hours.  CBG:  Recent Labs Lab 03/04/15 0359 03/04/15 0512 03/04/15 0622 03/04/15 0828 03/04/15 1222  GLUCAP 264* 217* 158* 184* 221*    No results found for this or any previous visit (from the past 240 hour(s)).   Studies: Ct Renal Stone Study  03/04/2015  CLINICAL DATA:  Acute onset of left lower quadrant abdominal pain. Assess for pyelonephritis. Initial encounter. EXAM: CT ABDOMEN AND PELVIS WITHOUT CONTRAST TECHNIQUE: Multidetector CT imaging of the abdomen and pelvis was performed following the standard protocol without IV contrast. COMPARISON:  CT of the abdomen and pelvis performed 10/04/2013 FINDINGS: The visualized lung bases are clear. The liver and spleen are unremarkable in appearance. The patient is status post cholecystectomy, with clips noted at the gallbladder fossa. The pancreas and adrenal glands are unremarkable. Mild soft tissue inflammation is noted about the left kidney, concerning for mild acute pyelonephritis. There is no evidence of hydronephrosis. No renal or ureteral stones are identified. Minimal right-sided perinephric stranding is noted. The right kidney is otherwise unremarkable. No free fluid is identified. The small bowel is unremarkable in appearance. The stomach is within normal limits. No acute vascular abnormalities are  seen. The appendix is normal in caliber and contains air, without evidence of appendicitis. The colon is unremarkable in appearance. The bladder is mildly distended and grossly unremarkable in appearance. The uterus is unremarkable. The ovaries are relatively symmetric. No suspicious adnexal masses are seen. No inguinal lymphadenopathy is seen. No acute osseous abnormalities are identified. IMPRESSION: Mild soft tissue inflammation about the left kidney, concerning for mild acute pyelonephritis, given the patient's symptoms. Electronically Signed   By: Garald Balding M.D.   On: 03/04/2015 02:24     Scheduled Meds: . cefTRIAXone (ROCEPHIN)  IV  1 g Intravenous Q24H  . enoxaparin (LOVENOX) injection  40 mg Subcutaneous Q24H  . insulin aspart  0-15 Units Subcutaneous TID WC  . insulin aspart  0-5 Units Subcutaneous QHS  . insulin glargine  10 Units Subcutaneous Daily  . sodium chloride  3 mL Intravenous Q12H   Continuous Infusions:  PRN Meds: acetaminophen **OR** acetaminophen, HYDROcodone-acetaminophen, ondansetron **OR** ondansetron (ZOFRAN) IV  Time spent: 30  minutes  Author: Berle Mull, MD [facility] Pager: [phone] 03/04/2015 2:49 PM  If 7PM-7AM, please contact night-coverage at [email or web address], password [facility]

## 2015-03-04 NOTE — Progress Notes (Signed)
Inpatient Diabetes Program Recommendations  AACE/ADA: New Consensus Statement on Inpatient Glycemic Control (2015)  Target Ranges:  Prepandial:   less than 140 mg/dL      Peak postprandial:   less than 180 mg/dL (1-2 hours)      Critically ill patients:  140 - 180 mg/dL   Review of Glycemic Control  Results for [name], [name] (MRN [id number]) as of 03/04/2015 12:49  Ref. Range 03/04/2015 03:59 03/04/2015 05:12 03/04/2015 06:22 03/04/2015 08:28 03/04/2015 12:22  Glucose-Capillary Latest Ref Range: 65-99 mg/dL 264 (H) 217 (H) 158 (H) 184 (H) 221 (H)    Diabetes history: Type 2 Outpatient Diabetes medications: Glucophage 500mg  bid Current orders for Inpatient glycemic control: Novolog 0-15 units tid, Novolog 0-5 units qhs  Inpatient Diabetes Program Recommendations: Consider starting low dose basal insulin, consider Lantus 10 units (0.1 units/kg) qday starting now.  Consider ordering an A1C to determine blood sugar control over the past 3 months.  Gentry Fitz, RN, BA, MHA, CDE Diabetes Coordinator Inpatient Diabetes Program  [phone] (Team Pager) [phone] ([place]) 03/04/2015 1:02 PM

## 2015-03-04 NOTE — Progress Notes (Addendum)
ANTIBIOTIC CONSULT NOTE - INITIAL  Pharmacy Consult for Ceftriaxone  Indication: UTI  Allergies  Allergen Reactions  . Penicillins Rash    Patient Measurements: Height: 5' 6.5" (168.9 cm) Weight: 214 lb 11.7 oz (97.4 kg) IBW/kg (Calculated) : 60.45   Vital Signs: Temp: 99.3 F (37.4 C) (01/07 0105) Temp Source: Rectal (01/07 0007) BP: 151/71 mmHg (01/07 0105) Pulse Rate: 114 (01/07 0105)  Labs:  Recent Labs  03/03/15 2103  WBC 18.5*  HGB 11.0*  PLT 478*  CREATININE 1.24*   Estimated Creatinine Clearance: 66.7 mL/min (by C-G formula based on Cr of 1.24). No results for input(s): VANCOTROUGH, VANCOPEAK, VANCORANDOM, GENTTROUGH, GENTPEAK, GENTRANDOM, TOBRATROUGH, TOBRAPEAK, TOBRARND, AMIKACINPEAK, AMIKACINTROU, AMIKACIN in the last 72 hours.   Microbiology: No results found for this or any previous visit (from the past 720 hour(s)).  Medical History: Past Medical History  Diagnosis Date  . Diabetes mellitus   . Asthma     Child    Assessment: Ceftriaxone for UTI, WBC elevated  Plan:  -Ceftriaxone 1g IV q24h -F/U urine culture -Pharmacy will sign off   Narda Bonds 03/04/2015,1:10 AM

## 2015-03-04 NOTE — ED Notes (Signed)
Attempted report 

## 2015-03-04 NOTE — Progress Notes (Signed)
Pt refused afternoon labs.

## 2015-03-05 LAB — GLUCOSE, CAPILLARY
GLUCOSE-CAPILLARY: 247 mg/dL — AB (ref 65–99)
Glucose-Capillary: 264 mg/dL — ABNORMAL HIGH (ref 65–99)
Glucose-Capillary: 262 mg/dL — ABNORMAL HIGH (ref 65–99)

## 2015-03-05 LAB — CBC WITH DIFFERENTIAL/PLATELET
BASOS PCT: 0 %
Basophils Absolute: 0.1 10*3/uL (ref 0.0–0.1)
EOS ABS: 0 10*3/uL (ref 0.0–0.7)
EOS PCT: 0 %
HCT: 29.4 % — ABNORMAL LOW (ref 36.0–46.0)
HEMOGLOBIN: 9.9 g/dL — AB (ref 12.0–15.0)
Lymphocytes Relative: 13 %
Lymphs Abs: 1.9 10*3/uL (ref 0.7–4.0)
MCH: 29.9 pg (ref 26.0–34.0)
MCHC: 33.7 g/dL (ref 30.0–36.0)
MCV: 88.8 fL (ref 78.0–100.0)
MONOS PCT: 4 %
Monocytes Absolute: 0.6 10*3/uL (ref 0.1–1.0)
NEUTROS ABS: 11.9 10*3/uL — AB (ref 1.7–7.7)
NEUTROS PCT: 83 %
PLATELETS: 459 10*3/uL — AB (ref 150–400)
RBC: 3.31 MIL/uL — ABNORMAL LOW (ref 3.87–5.11)
RDW: 12.5 % (ref 11.5–15.5)
WBC: 14.5 10*3/uL — ABNORMAL HIGH (ref 4.0–10.5)

## 2015-03-05 LAB — COMPREHENSIVE METABOLIC PANEL
ALBUMIN: 2.1 g/dL — AB (ref 3.5–5.0)
ALK PHOS: 98 U/L (ref 38–126)
ALT: 15 U/L (ref 14–54)
ANION GAP: 11 (ref 5–15)
AST: 25 U/L (ref 15–41)
BUN: 5 mg/dL — ABNORMAL LOW (ref 6–20)
CALCIUM: 8.1 mg/dL — AB (ref 8.9–10.3)
CHLORIDE: 97 mmol/L — AB (ref 101–111)
CO2: 25 mmol/L (ref 22–32)
Creatinine, Ser: 0.83 mg/dL (ref 0.44–1.00)
GFR calc non Af Amer: >60 mL/min (ref >60)
GLUCOSE: 316 mg/dL — AB (ref 65–99)
POTASSIUM: 3.6 mmol/L (ref 3.5–5.1)
SODIUM: 133 mmol/L — AB (ref 135–145)
Total Bilirubin: 0.8 mg/dL (ref 0.3–1.2)
Total Protein: 6.8 g/dL (ref 6.5–8.1)

## 2015-03-05 LAB — URINE CULTURE

## 2015-03-05 MED ORDER — INSULIN GLARGINE 100 UNIT/ML ~~LOC~~ SOLN
10.0000 [IU] | Freq: Once | SUBCUTANEOUS | Status: AC
  Administered 2015-03-05: 10 [IU] via SUBCUTANEOUS
  Filled 2015-03-05 (×2): qty 0.1

## 2015-03-05 MED ORDER — SIMETHICONE 80 MG PO CHEW
80.0000 mg | CHEWABLE_TABLET | Freq: Four times a day (QID) | ORAL | Status: DC | PRN
  Administered 2015-03-05: 80 mg via ORAL
  Filled 2015-03-05: qty 1

## 2015-03-05 MED ORDER — INSULIN STARTER KIT- SYRINGES (ENGLISH)
1.0000 | Freq: Once | Status: AC
  Administered 2015-03-05: 1
  Filled 2015-03-05: qty 1

## 2015-03-05 MED ORDER — INSULIN GLARGINE 100 UNIT/ML ~~LOC~~ SOLN
20.0000 [IU] | Freq: Every day | SUBCUTANEOUS | Status: DC
  Administered 2015-03-06: 20 [IU] via SUBCUTANEOUS
  Filled 2015-03-05: qty 0.2

## 2015-03-05 MED ORDER — INSULIN STARTER KIT- PEN NEEDLES (ENGLISH): 1.0000 | Freq: Once | Status: DC

## 2015-03-05 NOTE — Progress Notes (Signed)
[facility] Progress Note  Patient: Keyundra Fant [id number]   PCP: Minerva Ends, MD DOB: June 01, 1965   DOA: 03/03/2015   DOS: 03/05/2015   Date of Service: the patient was seen and examined on 03/05/2015  Subjective: Pain has resolved, no nausea no vomiting and diarrhea. Nutrition: Able to tolerate diet Activity: At present bedridden Last BM: 03/03/2015  Assessment and Plan: 1. Pyelonephritis  Sepsis  Patient presents with complains of left-sided flank pain with fever or tachycardia, leucocytosis. CT abdomen showing mild left pyelonephritis. Also minimal right perinephric stranding. No stone no hydronephrosis. Continue ceftriaxone, urine culture multiple species, blood culture no growth to date. Patient will require outpatient urology follow-up due to bilateral pyelonephritis  2.hypokalemia Hyponatremia AKI Sodium improved, as well as potassium  Renal function improved.  3. Type 2 diabetes mellitus   DKA on admission  Ketone positive in urine, anion gap elevated on admission, currently stable, will recheck. Increasing insulin Lantus as per diabetic coordinator. Continue sliding scale insulin, will discuss with case manager about arranging insulin on discharge Checking hemoglobin A1c.  DVT Prophylaxis: subcutaneous Heparin. Nutrition: diabetic diet Advance goals of care discussion: full code  Brief Summary of Hospitalization:  HPI: As per the H and P dictated on admission, "Jeaneane Adamec is a 50 y.o. female with a past medical history significant for NIDDM who presents with flank pain for 1 week.   The patient was in her usual state of health until about one week ago when she developed left flank pain. This was an aching pain, moderate to severe in intensity and constant all the time. This pain progressed, she developed fever and chills intermittently, so she came to the ER. She denied dysuria, hematuria, or other LUTS. She denied cough, sputum, dyspnea.  In the  ED, shehad a low-grade fever, tachycardia, tachypnea, and elevated WBC. Acid level is normal. The urinalysis showed copious pyuria bacteria and positive nitrites. She had mild AKI, hyperglycemia, and elevated anion gap with normal pH. Blood and urine cultures were obtained. Fluids, ceftriaxone, and insulin were administered and TRH were asked to evaluate for admission." Daily update, Procedures: none Consultants: none Antibiotics: Anti-infectives    Start     Dose/Rate Route Frequency Ordered Stop   03/04/15 2200  cefTRIAXone (ROCEPHIN) 1 g in dextrose 5 % 50 mL IVPB     1 g 100 mL/hr over 30 Minutes Intravenous Every 24 hours 03/04/15 0112     03/03/15 2215  cefTRIAXone (ROCEPHIN) 1 g in dextrose 5 % 50 mL IVPB     1 g 100 mL/hr over 30 Minutes Intravenous  Once 03/03/15 2211 03/03/15 2346      Family Communication: no family was present at bedside, at the time of interview.   Disposition:  Expected discharge date:03/05/2014 Barriers to safe discharge: culture   Intake/Output Summary (Last 24 hours) at 03/05/15 1446 Last data filed at 03/05/15 1417  Gross per 24 hour  Intake   1700 ml  Output      0 ml  Net   1700 ml   Filed Weights   03/04/15 0105  Weight: 97.4 kg (214 lb 11.7 oz)    Objective: Physical Exam: Filed Vitals:   03/04/15 1737 03/04/15 2215 03/05/15 0359 03/05/15 0851  BP: 155/86 153/70 138/85 150/87  Pulse: 101 110 90 118  Temp: 98.1 F (36.7 C) 99.9 F (37.7 C) 98.2 F (36.8 C) 99.7 F (37.6 C)  TempSrc: Oral   Oral  Resp: _0 20  Height:      Weight:      SpO2: 100% 99% 100% 100%    General: Appear in mild distress, no Rash; Oral Mucosa moist. Cardiovascular: S1 and S2 Present, no Murmur, no JVD Respiratory: Bilateral Air entry present and Clear to Auscultation, no Crackles, no wheezes Abdomen: Bowel Sound present, Soft and no tenderness Extremities: no Pedal edema, no calf tenderness  Data Reviewed: CBC:  Recent Labs Lab 03/03/15 2103  03/04/15 0426 03/05/15 0900  WBC 18.5* 17.7* 14.5*  NEUTROABS  --   --  11.9*  HGB 11.0* 9.5* 9.9*  HCT 32.0* 27.7* 29.4*  MCV 87.2 86.8 88.8  PLT 478* 447* 161*   Basic Metabolic Panel:  Recent Labs Lab 03/03/15 2103 03/04/15 0426 03/05/15 0900  NA 129* 136 133*  K 3.5 2.8* 3.6  CL 88* 103 97*  CO2 _0 GLUCOSE 444* 255* 316*  BUN 8 7 5*  CREATININE 1.24* 0.78 0.83  CALCIUM 9.1 7.9* 8.1*   Liver Function Tests:  Recent Labs Lab 03/03/15 2103 03/05/15 0900  AST 11* 25  ALT 12* 15  ALKPHOS 100 98  BILITOT 0.9 0.8  PROT 8.1 6.8  ALBUMIN 2.6* 2.1*    Recent Labs Lab 03/03/15 2103  LIPASE 17   No results for input(s): AMMONIA in the last 168 hours.  Cardiac Enzymes: No results for input(s): CKTOTAL, CKMB, CKMBINDEX, TROPONINI in the last 168 hours.  BNP (last 3 results) No results for input(s): BNP in the last 8760 hours.  CBG:  Recent Labs Lab 03/04/15 1222 03/04/15 1709 03/04/15 2202 03/05/15 0717 03/05/15 1139  GLUCAP 221* 282* 277* 264* 247*    Recent Results (from the past 240 hour(s))  Urine culture     Status: None   Collection Time: 03/03/15  8:36 PM  Result Value Ref Range Status   Specimen Description URINE, CLEAN CATCH  Final   Special Requests NONE  Final   Culture MULTIPLE SPECIES PRESENT, SUGGEST RECOLLECTION  Final   Report Status 03/05/2015 FINAL  Final  Blood culture (routine x 2)     Status: None (Preliminary result)   Collection Time: 03/03/15 10:50 PM  Result Value Ref Range Status   Specimen Description BLOOD RIGHT ARM  Final   Special Requests BOTTLES DRAWN AEROBIC AND ANAEROBIC 5ML  Final   Culture NO GROWTH 1 DAY  Final   Report Status PENDING  Incomplete  Blood culture (routine x 2)     Status: None (Preliminary result)   Collection Time: 03/03/15 11:04 PM  Result Value Ref Range Status   Specimen Description BLOOD LEFT HAND  Final   Special Requests BOTTLES DRAWN AEROBIC AND ANAEROBIC 5ML  Final   Culture  NO GROWTH 1 DAY  Final   Report Status PENDING  Incomplete     Studies: No results found.   Scheduled Meds: . cefTRIAXone (ROCEPHIN)  IV  1 g Intravenous Q24H  . enoxaparin (LOVENOX) injection  40 mg Subcutaneous Q24H  . insulin aspart  0-15 Units Subcutaneous TID WC  . insulin aspart  0-5 Units Subcutaneous QHS  . insulin glargine  10 Units Subcutaneous Once  . [START ON 03/06/2015] insulin glargine  20 Units Subcutaneous Daily  . insulin starter kit- pen needles  1 kit Other Once  . insulin starter kit- syringes  1 kit Other Once  . sodium chloride  3 mL Intravenous Q12H   Continuous Infusions:  PRN Meds: acetaminophen **OR** acetaminophen, HYDROcodone-acetaminophen, ondansetron **OR** ondansetron (ZOFRAN)  IV  Time spent: 30 minutes  Author: Berle Mull, MD [facility] Pager: [phone] 03/05/2015 2:46 PM  If 7PM-7AM, please contact night-coverage at [email or web address], password [facility]

## 2015-03-05 NOTE — Progress Notes (Signed)
Inpatient Diabetes Program Recommendations  AACE/ADA: New Consensus Statement on Inpatient Glycemic Control (2015)  Target Ranges:  Prepandial:   less than 140 mg/dL      Peak postprandial:   less than 180 mg/dL (1-2 hours)      Critically ill patients:  140 - 180 mg/dL   Review of Glycemic Control  Results for [name], [name] (MRN [id number]) as of 03/05/2015 11:45  Ref. Range 03/04/2015 08:28 03/04/2015 12:22 03/04/2015 17:09 03/04/2015 22:02 03/05/2015 07:17  Glucose-Capillary Latest Ref Range: 65-99 mg/dL 184 (H) 221 (H) 282 (H) 277 (H) 264 (H)    Diabetes history: Type 2 Outpatient Diabetes medications: Glucophage 500mg  bid Current orders for Inpatient glycemic control: Novolog 0-15 units tid, Novolog 0-5 units qhs, Lantus 10 units  Inpatient Diabetes Program Recommendations: Fasting blood sugars elevated this am likely as a result of no Novolog at bedtime (patient refused).  Patient also refused Novolog at supper. Consider increasing basal insulin; consider Lantus 20 units (0.2 units/kg) qday starting now. Consider adding Novolog 6 units at mealtime.  Staff- Patient is refusing to take insulin as ordered- please strongly encourage patient to take insulin    Gentry Fitz, RN, [place], [place], CDE Diabetes Coordinator Inpatient Diabetes Program  [phone] (Team Pager) [phone] ([place]) 03/05/2015 11:56 AM

## 2015-03-06 LAB — GLUCOSE, CAPILLARY
Glucose-Capillary: 275 mg/dL — ABNORMAL HIGH (ref 65–99)
Glucose-Capillary: 278 mg/dL — ABNORMAL HIGH (ref 65–99)
Glucose-Capillary: 273 mg/dL — ABNORMAL HIGH (ref 65–99)

## 2015-03-06 LAB — HEMOGLOBIN A1C
Hgb A1c MFr Bld: 12.9 % — ABNORMAL HIGH (ref 4.8–5.6)
Mean Plasma Glucose: 324 mg/dL

## 2015-03-06 MED ORDER — INSULIN GLARGINE 100 UNIT/ML ~~LOC~~ SOLN: 20.0000 [IU] | Freq: Every day | SUBCUTANEOUS | Status: DC

## 2015-03-06 MED ORDER — ACIDOPHILUS PROBIOTIC 100 MG PO CAPS: 100.0000 mg | ORAL_CAPSULE | Freq: Two times a day (BID) | ORAL | Status: DC

## 2015-03-06 MED ORDER — CEPHALEXIN 500 MG PO CAPS: 500.0000 mg | ORAL_CAPSULE | Freq: Four times a day (QID) | ORAL | Status: AC

## 2015-03-07 NOTE — Discharge Summary (Signed)
[facility] Discharge Summary   Patient: [name] [id number]   PCP: Minerva Ends, MD DOB: [birth year]   Date of admission: 03/03/2015   Date of discharge: 03/06/2015    Discharge Diagnoses:  Principal Problem:   Pyelonephritis Active Problems:   Diabetes mellitus with hyperglycemia, without long-term current use of insulin (HCC)   AKI (acute kidney injury) ([place])   Sepsis ([place])   Hyponatremia   Hypokalemia   DKA (diabetic ketoacidoses) ([place])   Recommendations for Outpatient Follow-up:  1. Follow-up with PCP in one week 2. Establish care with urology as an outpatient for bilateral pyelonephritis   Diet recommendation: Diabetic diet  Activity: The patient is advised to gradually reintroduce usual activities.  Discharge Condition: good  History of present illness: As per the H and P dictated on admission, "[name] is a 50 y.o. female with a past medical history significant for NIDDM who presents with flank pain for 1 week.   The patient was in her usual state of health until about one week ago when she developed left flank pain. This was an aching pain, moderate to severe in intensity and constant all the time. This pain progressed, she developed fever and chills intermittently, so she came to the ER. She denied dysuria, hematuria, or other LUTS. She denied cough, sputum, dyspnea.  In the ED, shehad a low-grade fever, tachycardia, tachypnea, and elevated WBC. Acid level is normal. The urinalysis showed copious pyuria bacteria and positive nitrites. She had mild AKI, hyperglycemia, and elevated anion gap with normal pH. Blood and urine cultures were obtained. Fluids, ceftriaxone, and insulin were administered and TRH were asked to evaluate for admission"  Hospital Course:  Summary of her active problems in the hospital is as following. 1. Pyelonephritis  Sepsis  Patient presents with complains of left-sided flank pain with fever or tachycardia,  leucocytosis. CT abdomen showing mild left pyelonephritis. Also minimal right perinephric stranding. No stone no hydronephrosis. Patient was started on IV ceftriaxone, blood cultures remain negative, urine culture showed multiple organisms, patient will be discharged on oral Keflex and will need outpatient urology appointment for bilateral pyelonephritis  2.hypokalemia Hyponatremia AKI Sodium improved, as well as potassium  Renal function improved. Recommend to avoid nephrotoxic medications  3. Type 2 diabetes mellitus  DKA on admission Hemoglobin A1c 12.9. Ketone positive in urine, anion gap elevated on admission, currently stable. Following recommendation from the diabetic educator, discharging on Lantus with metformin.   All other chronic medical condition were stable during the hospitalization.  Patient was ambulatory without any assistance. On the day of the discharge the patient's pain resolved and vital stable, and no other acute medical condition were reported by patient. the patient was felt safe to be discharge at home with family.  Procedures and Results:  none   Consultations:  none  DISCHARGE MEDICATION: Discharge Medication List as of 03/06/2015  9:39 AM    START taking these medications   Details  cephALEXin (KEFLEX) 500 MG capsule Take 1 capsule (500 mg total) by mouth 4 (four) times daily., Starting 03/06/2015, Until Fri 03/10/15, Normal    insulin glargine (LANTUS) 100 UNIT/ML injection Inject 0.2 mLs (20 Units total) into the skin daily., Starting 03/06/2015, Until Discontinued, Normal    Lactobacillus (ACIDOPHILUS PROBIOTIC) 100 MG CAPS Take 1 capsule (100 mg total) by mouth 2 (two) times daily., Starting 03/06/2015, Until Discontinued, Normal      CONTINUE these medications which have NOT CHANGED   Details  HYDROcodone-acetaminophen (NORCO) 5-325 MG  per tablet Take 1 tablet by mouth every 6 (six) hours as needed., Starting 09/16/2014, Until Discontinued, Print     ibuprofen (ADVIL,MOTRIN) 800 MG tablet Take 1 tablet (800 mg total) by mouth 3 (three) times daily., Starting 12/13/2013, Until Discontinued, Print    loratadine (CLARITIN) 10 MG tablet Take 1 tablet (10 mg total) by mouth daily., Starting 03/08/2014, Until Discontinued, Print    metFORMIN (GLUCOPHAGE) 500 MG tablet Take 1 tablet (500 mg total) by mouth 2 (two) times daily with a meal., Starting 07/29/2014, Until Discontinued, Print    polyethylene glycol powder (GLYCOLAX/MIRALAX) powder Take 17 g by mouth 2 (two) times daily. Until daily soft stools  OTC, Starting 07/29/2014, Until Discontinued, Print       Allergies  Allergen Reactions  . Penicillins Rash   Discharge Instructions    Ambulatory referral to Urology    Complete by:  As directed   For bilateral pyelonephritis     Diet general    Complete by:  As directed      Discharge instructions    Complete by:  As directed   It is important that you read following instructions as well as go over your medication list with RN to help you understand your care after this hospitalization.  Discharge Instructions: Follow up with PCP in 1 week. Establish care with urology in 1 month.   Please request your primary care physician to go over all Hospital Tests and Procedure/Radiological results at the follow up,  Please get all Hospital records sent to your PCP by signing hospital release before you go home.   Do not take more than prescribed Pain, Sleep and Anxiety Medications. You were cared for by a hospitalist during your hospital stay. If you have any questions about your discharge medications or the care you received while you were in the hospital after you are discharged, you can call the unit and ask to speak with the hospitalist on call if the hospitalist that took care of you is not available.  Once you are discharged, your primary care physician will handle any further medical issues. Please note that NO REFILLS for any  discharge medications will be authorized once you are discharged, as it is imperative that you return to your primary care physician (or establish a relationship with a primary care physician if you do not have one) for your aftercare needs so that they can reassess your need for medications and monitor your lab values. You Must read complete instructions/literature along with all the possible adverse reactions/side effects for all the Medicines you take and that have been prescribed to you. Take any new Medicines after you have completely understood and accept all the possible adverse reactions/side effects. Wear Seat belts while driving. If you have smoked or chewed Tobacco in the last 2 yrs please stop smoking and/or stop any Recreational drug use.     Increase activity slowly    Complete by:  As directed           Discharge Exam: Filed Weights   03/04/15 0105 03/05/15 2100  Weight: 97.4 kg (214 lb 11.7 oz) 99.2 kg (218 lb 11.1 oz)   Filed Vitals:   03/05/15 2100 03/06/15 0500  BP: 144/79 150/87  Pulse: 110 101  Temp: 99.9 F (37.7 C) 98.6 F (37 C)  Resp: 18 18   General: Appear in no distress, no Rash; Oral Mucosa moist. Cardiovascular: S1 and S2 Present, no Murmur, no JVD Respiratory: Bilateral Air entry present  and Clear to Auscultation, no Crackles, on wheezes Abdomen: Bowel Sound present, Soft and no tenderness, NO CVAT Extremities: no Pedal edema, no calf tenderness Neurology: Grossly no focal neuro deficit.  The results of significant diagnostics from this hospitalization (including imaging, microbiology, ancillary and laboratory) are listed below for reference.    Significant Diagnostic Studies: Ct Renal Stone Study  03/04/2015  CLINICAL DATA:  Acute onset of left lower quadrant abdominal pain. Assess for pyelonephritis. Initial encounter. EXAM: CT ABDOMEN AND PELVIS WITHOUT CONTRAST TECHNIQUE: Multidetector CT imaging of the abdomen and pelvis was performed following  the standard protocol without IV contrast. COMPARISON:  CT of the abdomen and pelvis performed 10/04/2013 FINDINGS: The visualized lung bases are clear. The liver and spleen are unremarkable in appearance. The patient is status post cholecystectomy, with clips noted at the gallbladder fossa. The pancreas and adrenal glands are unremarkable. Mild soft tissue inflammation is noted about the left kidney, concerning for mild acute pyelonephritis. There is no evidence of hydronephrosis. No renal or ureteral stones are identified. Minimal right-sided perinephric stranding is noted. The right kidney is otherwise unremarkable. No free fluid is identified. The small bowel is unremarkable in appearance. The stomach is within normal limits. No acute vascular abnormalities are seen. The appendix is normal in caliber and contains air, without evidence of appendicitis. The colon is unremarkable in appearance. The bladder is mildly distended and grossly unremarkable in appearance. The uterus is unremarkable. The ovaries are relatively symmetric. No suspicious adnexal masses are seen. No inguinal lymphadenopathy is seen. No acute osseous abnormalities are identified. IMPRESSION: Mild soft tissue inflammation about the left kidney, concerning for mild acute pyelonephritis, given the patient's symptoms. Electronically Signed   By: Garald Balding M.D.   On: 03/04/2015 02:24    Microbiology: Recent Results (from the past 240 hour(s))  Urine culture     Status: None   Collection Time: 03/03/15  8:36 PM  Result Value Ref Range Status   Specimen Description URINE, CLEAN CATCH  Final   Special Requests NONE  Final   Culture MULTIPLE SPECIES PRESENT, SUGGEST RECOLLECTION  Final   Report Status 03/05/2015 FINAL  Final  Blood culture (routine x 2)     Status: None (Preliminary result)   Collection Time: 03/03/15 10:50 PM  Result Value Ref Range Status   Specimen Description BLOOD RIGHT ARM  Final   Special Requests BOTTLES  DRAWN AEROBIC AND ANAEROBIC 5ML  Final   Culture NO GROWTH 3 DAYS  Final   Report Status PENDING  Incomplete  Blood culture (routine x 2)     Status: None (Preliminary result)   Collection Time: 03/03/15 11:04 PM  Result Value Ref Range Status   Specimen Description BLOOD LEFT HAND  Final   Special Requests BOTTLES DRAWN AEROBIC AND ANAEROBIC 5ML  Final   Culture NO GROWTH 3 DAYS  Final   Report Status PENDING  Incomplete     Labs: CBC:  Recent Labs Lab 03/03/15 2103 03/04/15 0426 03/05/15 0900  WBC 18.5* 17.7* 14.5*  NEUTROABS  --   --  11.9*  HGB 11.0* 9.5* 9.9*  HCT 32.0* 27.7* 29.4*  MCV 87.2 86.8 88.8  PLT 478* 447* [id number]*   Basic Metabolic Panel:  Recent Labs Lab 03/03/15 2103 03/04/15 0426 03/05/15 0900  NA 129* 136 133*  K 3.5 2.8* 3.6  CL 88* 103 97*  CO2 23 24 25   GLUCOSE 444* 255* 316*  BUN 8 7 5*  CREATININE 1.24* 0.78 0.83  CALCIUM 9.1 7.9* 8.1*   Liver Function Tests:  Recent Labs Lab 03/03/15 2103 03/05/15 0900  AST 11* 25  ALT 12* 15  ALKPHOS 100 98  BILITOT 0.9 0.8  PROT 8.1 6.8  ALBUMIN 2.6* 2.1*    Recent Labs Lab 03/03/15 2103  LIPASE 17   CBG:  Recent Labs Lab 03/05/15 0717 03/05/15 1139 03/05/15 1630 03/05/15 2133 03/06/15 0733  GLUCAP 264* 247* 262* 278* 273*   Time spent: 30 minutes  Signed:  Jiraiya Mcewan  [facility] 03/06/2015, 3:10 PM

## 2015-03-09 LAB — CULTURE, BLOOD (ROUTINE X 2)
CULTURE: NO GROWTH
CULTURE: NO GROWTH

## 2015-03-10 MED FILL — LANTUS 100 UNITS/ML VIAL: 100 | 30 days supply | Qty: 10 | Fill #0

## 2015-03-10 MED FILL — CEPHALEXIN 500 MG CAPSULE: 500 | 4 days supply | Qty: 18 | Fill #0

## 2015-05-07 ENCOUNTER — Encounter (HOSPITAL_COMMUNITY): Payer: Self-pay | Admitting: Emergency Medicine

## 2015-05-07 ENCOUNTER — Emergency Department (HOSPITAL_COMMUNITY)
Admission: EM | Admit: 2015-05-07 | Discharge: 2015-05-07 | Disposition: A | Discharge status: Home / Self Care | Payer: MEDICAID | Attending: Emergency Medicine | Admitting: Emergency Medicine

## 2015-05-07 DIAGNOSIS — Z794 Long term (current) use of insulin: Secondary | ICD-10-CM | POA: Insufficient documentation

## 2015-05-07 DIAGNOSIS — Z7984 Long term (current) use of oral hypoglycemic drugs: Secondary | ICD-10-CM | POA: Insufficient documentation

## 2015-05-07 DIAGNOSIS — J45909 Unspecified asthma, uncomplicated: Secondary | ICD-10-CM | POA: Insufficient documentation

## 2015-05-07 DIAGNOSIS — Z79899 Other long term (current) drug therapy: Secondary | ICD-10-CM | POA: Insufficient documentation

## 2015-05-07 DIAGNOSIS — Z880 Allergy status to penicillin: Secondary | ICD-10-CM | POA: Insufficient documentation

## 2015-05-07 DIAGNOSIS — E119 Type 2 diabetes mellitus without complications: Secondary | ICD-10-CM | POA: Insufficient documentation

## 2015-05-07 DIAGNOSIS — Z87891 Personal history of nicotine dependence: Secondary | ICD-10-CM | POA: Insufficient documentation

## 2015-05-07 DIAGNOSIS — N946 Dysmenorrhea, unspecified: Secondary | ICD-10-CM

## 2015-05-07 MED ORDER — IBUPROFEN 600 MG PO TABS: 600.0000 mg | ORAL_TABLET | Freq: Four times a day (QID) | ORAL | Status: DC | PRN

## 2015-05-07 MED ORDER — ACETAMINOPHEN 325 MG PO TABS
650.0000 mg | ORAL_TABLET | Freq: Once | ORAL | Status: AC
  Administered 2015-05-07: 650 mg via ORAL
  Filled 2015-05-07: qty 2

## 2015-05-07 MED ORDER — IBUPROFEN 400 MG PO TABS
800.0000 mg | ORAL_TABLET | Freq: Once | ORAL | Status: AC
  Administered 2015-05-07: 800 mg via ORAL
  Filled 2015-05-07: qty 2

## 2015-05-07 NOTE — ED Notes (Signed)
Declined W/C at D/C and was escorted to lobby by RN. 

## 2015-05-07 NOTE — ED Provider Notes (Signed)
CSN: [id number]     Arrival date & time 05/07/15  1209 History  By signing my name below, I, Soijett Blue, attest that this documentation has been prepared under the direction and in the presence of Jeannett Senior, [employer] Electronically Signed: [place], ED Scribe. 05/07/2015. 1:38 PM.   Chief Complaint  Patient presents with  . Abdominal Cramping      The history is provided by the patient. No language interpreter was used.    HPI Comments: [name] is a 50 y.o. female with a medical hx of DM who presents to the Emergency Department complaining of constant, moderate, non-radiating, abdominal cramping onset 2 days. Pt reports that these symptoms are similar to menstrual cramps that she has and she thinks that she is beginning her menstrual cycle. Pt notes that extra strength tylenol/ibuprofen typically work for her menstrual, but she is unable to afford them at this time. Pt states that her cramping starts and then she will have vaginal bleeding. She states that she has tried [facility] powders with no relief for her symptoms. She denies urinary symptoms, vaginal bleeding/discharge, fever, vomiting, diarrhea, and any other symptoms. Pt denies concern for STD at this time. Pt is allergic to penicillin. Patient's last menstrual period was 04/09/2015.   Past Medical History  Diagnosis Date  . Diabetes mellitus   . Asthma     Child   Past Surgical History  Procedure Laterality Date  . Cholecystectomy    . Tubal ligation     Family History  Problem Relation Age of Onset  . Diabetes Mother    Social History  Substance Use Topics  . Smoking status: Former Smoker -- 0.00 packs/day  . Smokeless tobacco: None     Comment: Smoked when she was a teenager.   . Alcohol Use: No   OB History    No data available     Review of Systems  Constitutional: Negative for fever.  Gastrointestinal: Positive for abdominal pain (cramping). Negative for vomiting and diarrhea.  Genitourinary:  Negative for dysuria, hematuria, vaginal bleeding, vaginal discharge and difficulty urinating.      Allergies  Penicillins  Home Medications   Prior to Admission medications   Medication Sig Start Date End Date Taking? Authorizing Provider  HYDROcodone-acetaminophen (NORCO) 5-325 MG per tablet Take 1 tablet by mouth every 6 (six) hours as needed. Patient taking differently: Take 1 tablet by mouth every 6 (six) hours as needed for moderate pain.  09/16/14   Hope Bunnie Pion, NP  ibuprofen (ADVIL,MOTRIN) 800 MG tablet Take 1 tablet (800 mg total) by mouth 3 (three) times daily. Patient taking differently: Take 800 mg by mouth every 8 (eight) hours as needed for moderate pain.  12/13/13   Domenic Moras, [name]  insulin glargine (LANTUS) 100 UNIT/ML injection Inject 0.2 mLs (20 Units total) into the skin daily. 03/06/15   Lavina Hamman, MD  Lactobacillus (ACIDOPHILUS PROBIOTIC) 100 MG CAPS Take 1 capsule (100 mg total) by mouth 2 (two) times daily. 03/06/15   Lavina Hamman, MD  loratadine (CLARITIN) 10 MG tablet Take 1 tablet (10 mg total) by mouth daily. Patient taking differently: Take 10 mg by mouth daily as needed for allergies.  03/08/14   Gareth Morgan, MD  metFORMIN (GLUCOPHAGE) 500 MG tablet Take 1 tablet (500 mg total) by mouth 2 (two) times daily with a meal. 07/29/14   Comer Locket, [name]  polyethylene glycol powder (GLYCOLAX/MIRALAX) powder Take 17 g by mouth 2 (two) times daily. Until  daily soft stools  OTC Patient taking differently: Take 17 g by mouth daily as needed for mild constipation. Until daily soft stools  OTC 07/29/14   Comer Locket, [name]   BP 120/75 mmHg  Pulse 99  Temp(Src) 98.3 F (36.8 C) (Oral)  Resp 20  Ht 5\' 6"  (1.676 m)  Wt 238 lb (107.956 kg)  BMI 38.43 kg/m2  SpO2 100%  LMP 04/09/2015 Physical Exam  Constitutional: She is oriented to person, place, and time. She appears well-developed and well-nourished. No distress.  HENT:  Head: Normocephalic and  atraumatic.  Eyes: EOM are normal.  Neck: Neck supple.  Cardiovascular: Normal rate, regular rhythm and normal heart sounds.   Pulmonary/Chest: Effort normal and breath sounds normal. No respiratory distress. She has no wheezes. She has no rales.  Abdominal: Soft. Bowel sounds are normal. She exhibits no distension. There is no tenderness. There is no rebound and no guarding.  Musculoskeletal: Normal range of motion.  Neurological: She is alert and oriented to person, place, and time.  Skin: Skin is warm and dry.  Psychiatric: She has a normal mood and affect. Her behavior is normal.  Nursing note and vitals reviewed.   ED Course  Procedures (including critical care time) DIAGNOSTIC STUDIES: Oxygen Saturation is 100% on RA, nl by my interpretation.    COORDINATION OF CARE: 1:38 PM Discussed treatment plan with pt at bedside and pt agreed to plan.    Labs Review Labs Reviewed - No data to display  Imaging Review No results found.   EKG Interpretation None      MDM   Final diagnoses:  Menstrual cramps   Pt in ED with lower abdominal cramping, that she states feels like her normal menustrual cramps. Pt normally takes ibuprofen and tylenol but states she did not have any. She is requesting some in ED. She denies any fever, chills, nausea, vomiting, urinary symptoms, vaginal discharge, concern for STI. She does not want pelvic exam or any tests ran in ED. Pt wants the medication so she can go back to work. Unable to diagnosed possible uti, or PID, or any other abnormality without labs or UA or exam, pt is aware and again does not want anything done. Will dc to work after tx with ibuprofen and tylenol.   Filed Vitals:   05/07/15 1222  BP: 120/75  Pulse: 99  Temp: 98.3 F (36.8 C)  TempSrc: Oral  Resp: 20  Height: 5\' 6"  (1.676 m)  Weight: 107.956 kg  SpO2: 100%     Jeannett Senior, [name] 05/07/15 1653  Lacretia Leigh, MD 05/10/15 1424

## 2015-05-07 NOTE — ED Notes (Signed)
Pt here with c/o abdominal cramping onset yesterday. Pt reports symptoms similar to the cramps she gets when she is getting her menstrual cycle. Pt reports that extra strength tylenol or ibuprofen works for her pain but she cannot afford them right now. Pt denies any other symptoms.

## 2015-05-07 NOTE — ED Notes (Signed)
PT requesting  WORK NOTE.

## 2015-05-07 NOTE — Discharge Instructions (Signed)
Ibuprofen and tylenol for cramps. We did not run any tests or do pelvic exam today, so I am unable to determine if there is anything else that may be going on. If not improving in 1-2 days, make sure to see your doctor or return to ED.    Dysmenorrhea Menstrual cramps (dysmenorrhea) are caused by the muscles of the uterus tightening (contracting) during a menstrual period. For some women, this discomfort is merely bothersome. For others, dysmenorrhea can be severe enough to interfere with everyday activities for a few days each month. Primary dysmenorrhea is menstrual cramps that last a couple of days when you start having menstrual periods or soon after. This often begins after a teenager starts having her period. As a woman gets older or has a baby, the cramps will usually lessen or disappear. Secondary dysmenorrhea begins later in life, lasts longer, and the pain may be stronger than primary dysmenorrhea. The pain may start before the period and last a few days after the period.  CAUSES  Dysmenorrhea is usually caused by an underlying problem, such as:  The tissue lining the uterus grows outside of the uterus in other areas of the body (endometriosis).  The endometrial tissue, which normally lines the uterus, is found in or grows into the muscular walls of the uterus (adenomyosis).  The pelvic blood vessels are engorged with blood just before the menstrual period (pelvic congestive syndrome).  Overgrowth of cells (polyps) in the lining of the uterus or cervix.  Falling down of the uterus (prolapse) because of loose or stretched ligaments.  Depression.  Bladder problems, infection, or inflammation.  Problems with the intestine, a tumor, or irritable bowel syndrome.  Cancer of the female organs or bladder.  A severely tipped uterus.  A very tight opening or closed cervix.  Noncancerous tumors of the uterus (fibroids).  Pelvic inflammatory disease (PID).  Pelvic scarring  (adhesions) from a previous surgery.  Ovarian cyst.  An intrauterine device (IUD) used for birth control. RISK FACTORS You may be at greater risk of dysmenorrhea if:  You are younger than age 53.  You started puberty early.  You have irregular or heavy bleeding.  You have never given birth.  You have a family history of this problem.  You are a smoker. SIGNS AND SYMPTOMS   Cramping or throbbing pain in your lower abdomen.  Headaches.  Lower back pain.  Nausea or vomiting.  Diarrhea.  Sweating or dizziness.  Loose stools. DIAGNOSIS  A diagnosis is based on your history, symptoms, physical exam, diagnostic tests, or procedures. Diagnostic tests or procedures may include:  Blood tests.  Ultrasonography.  An examination of the lining of the uterus (dilation and curettage, D&C).  An examination inside your abdomen or pelvis with a scope (laparoscopy).  X-rays.  CT scan.  MRI.  An examination inside the bladder with a scope (cystoscopy).  An examination inside the intestine or stomach with a scope (colonoscopy, gastroscopy). TREATMENT  Treatment depends on the cause of the dysmenorrhea. Treatment may include:  Pain medicine prescribed by your health care provider.  Birth control pills or an IUD with progesterone hormone in it.  Hormone replacement therapy.  Nonsteroidal anti-inflammatory drugs (NSAIDs). These may help stop the production of prostaglandins.  Surgery to remove adhesions, endometriosis, ovarian cyst, or fibroids.  Removal of the uterus (hysterectomy).  Progesterone shots to stop the menstrual period.  Cutting the nerves on the sacrum that go to the female organs (presacral neurectomy).  Electric current to  the sacral nerves (sacral nerve stimulation).  Antidepressant medicine.  Psychiatric therapy, counseling, or group therapy.  Exercise and physical therapy.  Meditation and yoga therapy.  Acupuncture. HOME CARE INSTRUCTIONS     Only take over-the-counter or prescription medicines as directed by your health care provider.  Place a heating pad or hot water bottle on your lower back or abdomen. Do not sleep with the heating pad.  Use aerobic exercises, walking, swimming, biking, and other exercises to help lessen the cramping.  Massage to the lower back or abdomen may help.  Stop smoking.  Avoid alcohol and caffeine. SEEK MEDICAL CARE IF:   Your pain does not get better with medicine.  You have pain with sexual intercourse.  Your pain increases and is not controlled with medicines.  You have abnormal vaginal bleeding with your period.  You develop nausea or vomiting with your period that is not controlled with medicine. SEEK IMMEDIATE MEDICAL CARE IF:  You pass out.    This information is not intended to replace advice given to you by your health care provider. Make sure you discuss any questions you have with your health care provider.   Document Released: 02/11/2005 Document Revised: 10/14/2012 Document Reviewed: 07/30/2012 Elsevier Interactive Patient Education [employer].

## 2015-05-09 ENCOUNTER — Encounter (HOSPITAL_COMMUNITY): Payer: Self-pay

## 2015-05-09 ENCOUNTER — Emergency Department (HOSPITAL_COMMUNITY)
Admission: EM | Admit: 2015-05-09 | Discharge: 2015-05-09 | Disposition: A | Discharge status: Home / Self Care | Payer: MEDICAID | Attending: Emergency Medicine | Admitting: Emergency Medicine

## 2015-05-09 DIAGNOSIS — E119 Type 2 diabetes mellitus without complications: Secondary | ICD-10-CM | POA: Insufficient documentation

## 2015-05-09 DIAGNOSIS — K0381 Cracked tooth: Secondary | ICD-10-CM | POA: Insufficient documentation

## 2015-05-09 DIAGNOSIS — IMO0001 Reserved for inherently not codable concepts without codable children: Secondary | ICD-10-CM

## 2015-05-09 DIAGNOSIS — K002 Abnormalities of size and form of teeth: Secondary | ICD-10-CM | POA: Insufficient documentation

## 2015-05-09 DIAGNOSIS — K029 Dental caries, unspecified: Secondary | ICD-10-CM | POA: Insufficient documentation

## 2015-05-09 DIAGNOSIS — K068 Other specified disorders of gingiva and edentulous alveolar ridge: Secondary | ICD-10-CM | POA: Insufficient documentation

## 2015-05-09 DIAGNOSIS — Z7984 Long term (current) use of oral hypoglycemic drugs: Secondary | ICD-10-CM | POA: Insufficient documentation

## 2015-05-09 DIAGNOSIS — Z794 Long term (current) use of insulin: Secondary | ICD-10-CM | POA: Insufficient documentation

## 2015-05-09 DIAGNOSIS — Z880 Allergy status to penicillin: Secondary | ICD-10-CM | POA: Insufficient documentation

## 2015-05-09 DIAGNOSIS — J45909 Unspecified asthma, uncomplicated: Secondary | ICD-10-CM | POA: Insufficient documentation

## 2015-05-09 DIAGNOSIS — Z87891 Personal history of nicotine dependence: Secondary | ICD-10-CM | POA: Insufficient documentation

## 2015-05-09 DIAGNOSIS — Z79899 Other long term (current) drug therapy: Secondary | ICD-10-CM | POA: Insufficient documentation

## 2015-05-09 MED ORDER — IBUPROFEN 400 MG PO TABS
400.0000 mg | ORAL_TABLET | Freq: Once | ORAL | Status: AC
  Administered 2015-05-09: 400 mg via ORAL
  Filled 2015-05-09: qty 1

## 2015-05-09 NOTE — ED Notes (Signed)
Patient here with gum swelling since eating seafood last night, patient thinks the shell or glass \\cut  hr gum, no distress

## 2015-05-09 NOTE — ED Notes (Signed)
Pt states she ate scallops last pm, thinks there was glass in it. States it cut her upper, front gum. Also has pain with swallowing.

## 2015-05-09 NOTE — Discharge Instructions (Signed)
Follow-up with your doctor next week for reevaluation. You may take Motrin or Tylenol for discomfort. Her symptoms should resolve on their own over the next week. Return to ED for any new or worsening symptoms as discussed.  Dental Care and Dentist Visits Dental care supports good overall health. Regular dental visits can also help you avoid dental pain, bleeding, infection, and other more serious health problems in the future. It is important to keep the mouth healthy because diseases in the teeth, gums, and other oral tissues can spread to other areas of the body. Some problems, such as diabetes, heart disease, and pre-term labor have been associated with poor oral health.  See your dentist every 6 months. If you experience emergency problems such as a toothache or broken tooth, go to the dentist right away. If you see your dentist regularly, you may catch problems early. It is easier to be treated for problems in the early stages.  WHAT TO EXPECT AT A DENTIST VISIT  Your dentist will look for many common oral health problems and recommend proper treatment. At your regular dental visit, you can expect:  Gentle cleaning of the teeth and gums. This includes scraping and polishing. This helps to remove the sticky substance around the teeth and gums (plaque). Plaque forms in the mouth shortly after eating. Over time, plaque hardens on the teeth as tartar. If tartar is not removed regularly, it can cause problems. Cleaning also helps remove stains.  Periodic X-rays. These pictures of the teeth and supporting bone will help your dentist assess the health of your teeth.  Periodic fluoride treatments. Fluoride is a natural mineral shown to help strengthen teeth. Fluoride treatmentinvolves applying a fluoride gel or varnish to the teeth. It is most commonly done in children.  Examination of the mouth, tongue, jaws, teeth, and gums to look for any oral health problems, such as:  Cavities (dental caries).  This is decay on the tooth caused by plaque, sugar, and acid in the mouth. It is best to catch a cavity when it is small.  Inflammation of the gums caused by plaque buildup (gingivitis).  Problems with the mouth or malformed or misaligned teeth.  Oral cancer or other diseases of the soft tissues or jaws. KEEP YOUR TEETH AND GUMS HEALTHY For healthy teeth and gums, follow these general guidelines as well as your dentist's specific advice:  Have your teeth professionally cleaned at the dentist every 6 months.  Brush twice daily with a fluoride toothpaste.  Floss your teeth daily.  Ask your dentist if you need fluoride supplements, treatments, or fluoride toothpaste.  Eat a healthy diet. Reduce foods and drinks with added sugar.  Avoid smoking. TREATMENT FOR ORAL HEALTH PROBLEMS If you have oral health problems, treatment varies depending on the conditions present in your teeth and gums.  Your caregiver will most likely recommend good oral hygiene at each visit.  For cavities, gingivitis, or other oral health disease, your caregiver will perform a procedure to treat the problem. This is typically done at a separate appointment. Sometimes your caregiver will refer you to another dental specialist for specific tooth problems or for surgery. SEEK IMMEDIATE DENTAL CARE IF:  You have pain, bleeding, or soreness in the gum, tooth, jaw, or mouth area.  A permanent tooth becomes loose or separated from the gum socket.  You experience a blow or injury to the mouth or jaw area.   This information is not intended to replace advice given to you by  your health care provider. Make sure you discuss any questions you have with your health care provider.   Document Released: 10/24/2010 Document Revised: 05/06/2011 Document Reviewed: 10/24/2010 Elsevier Interactive Patient Education [employer].

## 2015-05-09 NOTE — ED Provider Notes (Signed)
History  By signing my name below, I, [name], attest that this documentation has been prepared under the direction and in the presence of [employer], [name]. Electronically Signed: Marlowe Bradley, ED Scribe. 05/09/2015. 2:17 PM.  Chief Complaint  Patient presents with  . gum swelling    The history is provided by the patient and medical records. No language interpreter was used.    HPI Comments:  [name] is a 50 y.o. obese female who presents to the Emergency Department complaining of gingival swelling that began last night while eating seafood. Pt states there was a sharp object in the seafood. She reports Mild pain with swallowing. She has not taken anything for pain but has gargled with warm, salty water and applied Orajel. She denies modifying factors. She denies fever, chills, nausea, vomiting or bleeding from the gums. No shortness of breath, cough.  Past Medical History  Diagnosis Date  . Diabetes mellitus   . Asthma     Child   Past Surgical History  Procedure Laterality Date  . Cholecystectomy    . Tubal ligation     Family History  Problem Relation Age of Onset  . Diabetes Mother    Social History  Substance Use Topics  . Smoking status: Former Smoker -- 0.00 packs/day  . Smokeless tobacco: None     Comment: Smoked when she was a teenager.   . Alcohol Use: No   OB History    No data available     Review of Systems A complete 10 system review of systems was obtained and all systems are negative except as noted in the HPI and PMH.   Allergies  Penicillins  Home Medications   Prior to Admission medications   Medication Sig Start Date End Date Taking? Authorizing Provider  HYDROcodone-acetaminophen (NORCO) 5-325 MG per tablet Take 1 tablet by mouth every 6 (six) hours as needed. Patient taking differently: Take 1 tablet by mouth every 6 (six) hours as needed for moderate pain.  09/16/14   Hope Bunnie Pion, NP  ibuprofen (ADVIL,MOTRIN) 600 MG  tablet Take 1 tablet (600 mg total) by mouth every 6 (six) hours as needed. 05/07/15   Tatyana Kirichenko, [name]  insulin glargine (LANTUS) 100 UNIT/ML injection Inject 0.2 mLs (20 Units total) into the skin daily. 03/06/15   Lavina Hamman, MD  Lactobacillus (ACIDOPHILUS PROBIOTIC) 100 MG CAPS Take 1 capsule (100 mg total) by mouth 2 (two) times daily. 03/06/15   Lavina Hamman, MD  loratadine (CLARITIN) 10 MG tablet Take 1 tablet (10 mg total) by mouth daily. Patient taking differently: Take 10 mg by mouth daily as needed for allergies.  03/08/14   Gareth Morgan, MD  metFORMIN (GLUCOPHAGE) 500 MG tablet Take 1 tablet (500 mg total) by mouth 2 (two) times daily with a meal. 07/29/14   Comer Locket, [name]  polyethylene glycol powder (GLYCOLAX/MIRALAX) powder Take 17 g by mouth 2 (two) times daily. Until daily soft stools  OTC Patient taking differently: Take 17 g by mouth daily as needed for mild constipation. Until daily soft stools  OTC 07/29/14   Comer Locket, [name]   Triage Vitals: BP 157/104 mmHg  Pulse 98  Temp(Src) 98.2 F (36.8 C) (Oral)  Resp 18  SpO2 100%  LMP 04/09/2015 Physical Exam  Constitutional: She is oriented to person, place, and time. She appears well-developed and well-nourished.  HENT:  Head: Normocephalic and atraumatic.  Mouth/Throat: Uvula is midline, oropharynx is clear and moist and mucous membranes  are normal. Abnormal dentition. Dental caries present. No lacerations.  Grossly poor dentition with no identifiable trauma. Multiple missing and fractured teeth with active carries.  Eyes: EOM are normal.  Neck: Normal range of motion.  Cardiovascular: Normal rate.   Pulmonary/Chest: Effort normal.  Musculoskeletal: Normal range of motion.  Neurological: She is alert and oriented to person, place, and time.  Skin: Skin is warm and dry.  Psychiatric: She has a normal mood and affect. Her behavior is normal.  Nursing note and vitals reviewed.   ED Course   Procedures (including critical care time) DIAGNOSTIC STUDIES: Oxygen Saturation is 100% on RA, normal by my interpretation.   COORDINATION OF CARE: 2:14 PM- Advised pt to take OTC Motrin for pain and continue rinsing with warm, salty water. Return precautions discussed. Pt verbalizes understanding and agrees to plan.  Medications  ibuprofen (ADVIL,MOTRIN) tablet 400 mg (not administered)    Labs Review Labs Reviewed - No data to display  Imaging Review No results found. I have personally reviewed and evaluated these images and lab results as part of my medical decision-making.   EKG Interpretation None      MDM  [name] is a 50 y.o. female who presents for evaluation of irritation after eating seafood. Patient presumably had gums irritated by seafood/shell. On exam, there is no evidence of trauma, benign cardio pulmonary exam. Low suspicion for obstructive foreign body. However, patient does have grossly poor dentition and discussed she will need follow-up with a dentist for definitive care. She verbalizes understanding and agrees with this plan. Continue with symptomatic support at home. Discussed return precautions. Hemodynamically stable, afebrile and appropriate for discharge. Final diagnoses:  Irritation of gums and throat   I personally performed the services described in this documentation, which was scribed in my presence. The recorded information has been reviewed and is accurate.     Comer Locket, [name] 05/09/15 1455  Gareth Morgan, MD 05/10/15 1145

## 2015-05-10 ENCOUNTER — Encounter: Payer: Self-pay | Admitting: Internal Medicine

## 2015-05-10 ENCOUNTER — Ambulatory Visit: Payer: Medicaid Other | Attending: Internal Medicine | Admitting: Internal Medicine

## 2015-05-10 VITALS — BP 148/91 | HR 81 | Temp 98.0°F | Resp 18 | Ht 66.0 in | Wt 257.4 lb

## 2015-05-10 DIAGNOSIS — Z880 Allergy status to penicillin: Secondary | ICD-10-CM | POA: Insufficient documentation

## 2015-05-10 DIAGNOSIS — Z87891 Personal history of nicotine dependence: Secondary | ICD-10-CM | POA: Insufficient documentation

## 2015-05-10 DIAGNOSIS — Z794 Long term (current) use of insulin: Secondary | ICD-10-CM | POA: Insufficient documentation

## 2015-05-10 DIAGNOSIS — Z7984 Long term (current) use of oral hypoglycemic drugs: Secondary | ICD-10-CM | POA: Insufficient documentation

## 2015-05-10 DIAGNOSIS — J45909 Unspecified asthma, uncomplicated: Secondary | ICD-10-CM | POA: Insufficient documentation

## 2015-05-10 DIAGNOSIS — R1310 Dysphagia, unspecified: Secondary | ICD-10-CM | POA: Insufficient documentation

## 2015-05-10 DIAGNOSIS — Z79899 Other long term (current) drug therapy: Secondary | ICD-10-CM | POA: Insufficient documentation

## 2015-05-10 DIAGNOSIS — E1165 Type 2 diabetes mellitus with hyperglycemia: Secondary | ICD-10-CM | POA: Insufficient documentation

## 2015-05-10 DIAGNOSIS — K029 Dental caries, unspecified: Secondary | ICD-10-CM | POA: Insufficient documentation

## 2015-05-10 LAB — GLUCOSE, POCT (MANUAL RESULT ENTRY): POC GLUCOSE: 393 mg/dL — AB (ref 70–99)

## 2015-05-10 MED ORDER — INSULIN ASPART 100 UNIT/ML ~~LOC~~ SOLN
20.0000 [IU] | Freq: Once | SUBCUTANEOUS | Status: AC
  Administered 2015-05-10: 20 [IU] via SUBCUTANEOUS
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
## 2015-05-10 NOTE — Patient Instructions (Signed)
-   fu w/ Erline Levine for dm chk 2wks
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
## 2015-05-24 ENCOUNTER — Ambulatory Visit: Payer: Medicaid Other | Admitting: Internal Medicine

## 2015-06-20 ENCOUNTER — Emergency Department
Admission: EM | Admit: 2015-06-20 | Discharge: 2015-06-20 | Discharge status: Against Medical Advice | Payer: Medicaid Other | Attending: Emergency Medicine | Admitting: Emergency Medicine

## 2015-06-20 ENCOUNTER — Encounter: Payer: Self-pay | Admitting: Emergency Medicine

## 2015-06-20 ENCOUNTER — Emergency Department: Payer: Self-pay

## 2015-06-20 DIAGNOSIS — R42 Dizziness and giddiness: Secondary | ICD-10-CM

## 2015-06-20 DIAGNOSIS — Z794 Long term (current) use of insulin: Secondary | ICD-10-CM | POA: Insufficient documentation

## 2015-06-20 DIAGNOSIS — Z7984 Long term (current) use of oral hypoglycemic drugs: Secondary | ICD-10-CM | POA: Insufficient documentation

## 2015-06-20 DIAGNOSIS — Z79899 Other long term (current) drug therapy: Secondary | ICD-10-CM | POA: Insufficient documentation

## 2015-06-20 DIAGNOSIS — J45909 Unspecified asthma, uncomplicated: Secondary | ICD-10-CM | POA: Insufficient documentation

## 2015-06-20 DIAGNOSIS — Z87891 Personal history of nicotine dependence: Secondary | ICD-10-CM | POA: Insufficient documentation

## 2015-06-20 DIAGNOSIS — R739 Hyperglycemia, unspecified: Secondary | ICD-10-CM

## 2015-06-20 DIAGNOSIS — E1165 Type 2 diabetes mellitus with hyperglycemia: Secondary | ICD-10-CM | POA: Insufficient documentation

## 2015-06-20 LAB — CBC
HCT: 32.6 % — ABNORMAL LOW (ref 35.0–47.0)
Hemoglobin: 11 g/dL — ABNORMAL LOW (ref 12.0–16.0)
MCH: 30.2 pg (ref 26.0–34.0)
MCHC: 33.9 g/dL (ref 32.0–36.0)
MCV: 89.3 fL (ref 80.0–100.0)
PLATELETS: 227 10*3/uL (ref 150–440)
RBC: 3.65 MIL/uL — ABNORMAL LOW (ref 3.80–5.20)
RDW: 13.2 % (ref 11.5–14.5)
WBC: 7.7 10*3/uL (ref 3.6–11.0)

## 2015-06-20 LAB — GLUCOSE, CAPILLARY
GLUCOSE-CAPILLARY: 308 mg/dL — AB (ref 65–99)
Glucose-Capillary: 322 mg/dL — ABNORMAL HIGH (ref 65–99)
Glucose-Capillary: 397 mg/dL — ABNORMAL HIGH (ref 65–99)

## 2015-06-20 LAB — COMPREHENSIVE METABOLIC PANEL
ALBUMIN: 2.8 g/dL — AB (ref 3.5–5.0)
ALT: 14 U/L (ref 14–54)
ANION GAP: 7 (ref 5–15)
AST: 17 U/L (ref 15–41)
Alkaline Phosphatase: 78 U/L (ref 38–126)
BUN: 17 mg/dL (ref 6–20)
CHLORIDE: 98 mmol/L — AB (ref 101–111)
CO2: 26 mmol/L (ref 22–32)
Calcium: 8.6 mg/dL — ABNORMAL LOW (ref 8.9–10.3)
Creatinine, Ser: 1.05 mg/dL — ABNORMAL HIGH (ref 0.44–1.00)
GFR calc Af Amer: >60 mL/min (ref >60)
Glucose, Bld: 411 mg/dL — ABNORMAL HIGH (ref 65–99)
POTASSIUM: 3.5 mmol/L (ref 3.5–5.1)
Sodium: 131 mmol/L — ABNORMAL LOW (ref 135–145)
Total Bilirubin: 0.6 mg/dL (ref 0.3–1.2)
Total Protein: 7.3 g/dL (ref 6.5–8.1)

## 2015-06-20 LAB — TROPONIN I

## 2015-06-20 MED ORDER — SODIUM CHLORIDE 0.9 % IV BOLUS (SEPSIS)
1000.0000 mL | Freq: Once | INTRAVENOUS | Status: AC
  Administered 2015-06-20: 1000 mL via INTRAVENOUS

## 2015-06-20 MED ORDER — HYDROCHLOROTHIAZIDE 25 MG PO TABS: 25.0000 mg | ORAL_TABLET | Freq: Every day | ORAL | Status: DC

## 2015-06-20 MED ORDER — METFORMIN HCL 500 MG PO TABS: 500.0000 mg | ORAL_TABLET | Freq: Two times a day (BID) | ORAL | Status: DC

## 2015-06-20 NOTE — ED Notes (Signed)
Unsuccessful IV start
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
## 2015-06-20 NOTE — ED Notes (Signed)
Unable to draw Trop using the IV on her left hand and patient refusing restick.  Will try again once the second bolus NS completes.  Patient was OK with that.

## 2015-06-20 NOTE — ED Notes (Signed)
Received a call from CT that patient refused her scan, and that MD was made aware of patient decision.

## 2015-06-20 NOTE — ED Notes (Addendum)
Patient reported her ride was here and waiting out front. Patient was wheeled to the lobby and ride was not out front. Patient then had me call her ride again that said "they were on the way" Pt was instructed to come back if having any abnormal symptoms and was instructed to follow up with primary care doctor about HTN and her diabetes. Patient relayed to MD she will follow up with PCP today

## 2015-06-20 NOTE — ED Notes (Signed)
Pt reports elevated blood sugar - She was at work and requested that EMS be called - Pt denies any other complaints

## 2015-06-20 NOTE — ED Notes (Signed)
Tried getting a new BP on patient since she was d/c from the equipment when she went to the CT she refused.  A high BP returned, higher than her previous BP's so I suspected a faulty measurement.  Patient refused another BP but I did talk her into letting me do one before d/c.
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
## 2015-06-20 NOTE — ED Notes (Signed)
Pt advised that orthostatic vitals needed to be obtained - explained what it was and why important - at this time she states she will wait "a little while" - I explained I would be back in 30 minutes to obtain vitals

## 2015-06-20 NOTE — ED Notes (Signed)
Pt refused CT and troponin redraw

## 2015-06-20 NOTE — ED Provider Notes (Addendum)
[facility] Emergency Department Provider Note  ____________________________________________  Time seen: Approximately 106 AM  I have reviewed the triage vital signs and the nursing notes.   HISTORY  Chief Complaint Hyperglycemia    HPI [name] is a 50 y.o. female who comes into the hospital today with high blood sugar. The patient reports that she had a glucose reading at work that was over 600. She reports that she has been feeling faint and dizzy and could hardly walk. She is been feeling that way for the past 2-3 days. The patient typically takes metformin but ran out 1-2 weeks ago. The patient reports that she has not been eating very well often that she has been drinking fluids. The patient reports that EMS did not give her any medications and she did not take anything prior to coming. She denies any frequent urination but has had some excessive thirst. She's had no chest pain no shortness of breath no belly pain as well. The patient is here for evaluation.  Past Medical History  Diagnosis Date  . Diabetes mellitus   . Asthma     Child    Patient Active Problem List   Diagnosis Date Noted  . Dental cavities 05/10/2015  . Sepsis ([place]) 03/04/2015  . Hyponatremia 03/04/2015  . Hypokalemia 03/04/2015  . DKA (diabetic ketoacidoses) ([place]) 03/04/2015  . Diabetes mellitus with hyperglycemia, without long-term current use of insulin ([place]) 03/03/2015  . Pyelonephritis 03/03/2015  . AKI (acute kidney injury) ([place]) 03/03/2015  . Upper respiratory disease 03/08/2014  . Tooth abscess 03/08/2014  . Chest pain 06/25/2011    Past Surgical History  Procedure Laterality Date  . Cholecystectomy    . Tubal ligation      Current Outpatient Rx  Name  Route  Sig  Dispense  Refill  . HYDROcodone-acetaminophen (NORCO) 5-325 MG per tablet   Oral   Take 1 tablet by mouth every 6 (six) hours as needed. Patient taking differently: Take 1 tablet by mouth  every 6 (six) hours as needed for moderate pain.    15 tablet   0   . ibuprofen (ADVIL,MOTRIN) 600 MG tablet   Oral   Take 1 tablet (600 mg total) by mouth every 6 (six) hours as needed.   30 tablet   0   . insulin glargine (LANTUS) 100 UNIT/ML injection   Subcutaneous   Inject 0.2 mLs (20 Units total) into the skin daily.   10 mL   11   . Lactobacillus (ACIDOPHILUS PROBIOTIC) 100 MG CAPS   Oral   Take 1 capsule (100 mg total) by mouth 2 (two) times daily.   10 capsule   0   . loratadine (CLARITIN) 10 MG tablet   Oral   Take 1 tablet (10 mg total) by mouth daily. Patient taking differently: Take 10 mg by mouth daily as needed for allergies.    30 tablet   0   . metFORMIN (GLUCOPHAGE) 500 MG tablet   Oral   Take 1 tablet (500 mg total) by mouth 2 (two) times daily with a meal.   60 tablet   0   . polyethylene glycol powder (GLYCOLAX/MIRALAX) powder   Oral   Take 17 g by mouth 2 (two) times daily. Until daily soft stools  OTC Patient taking differently: Take 17 g by mouth daily as needed for mild constipation. Until daily soft stools  OTC   119 g   0     Allergies Penicillins  Family History  Problem Relation Age of Onset  . Diabetes Mother     Social History Social History  Substance Use Topics  . Smoking status: Former Smoker -- 0.00 packs/day  . Smokeless tobacco: None     Comment: Smoked when she was a teenager.   . Alcohol Use: No    Review of Systems Constitutional: High blood sugar No fever/chills Eyes: No visual changes. ENT: No sore throat. Cardiovascular: Denies chest pain. Respiratory: Denies shortness of breath. Gastrointestinal: No abdominal pain.  No nausea, no vomiting.  No diarrhea.  No constipation. Genitourinary: Negative for dysuria. Musculoskeletal: Negative for back pain. Skin: Negative for rash. Neurological: Dizziness, weakness and lightheadedness.  10-point ROS otherwise  negative.  ____________________________________________   PHYSICAL EXAM:  VITAL SIGNS: ED Triage Vitals  Enc Vitals Group     BP 06/20/15 0044 145/86 mmHg     Pulse Rate 06/20/15 0044 101     Resp 06/20/15 0044 14     Temp 06/20/15 0044 99.4 F (37.4 C)     Temp Source 06/20/15 0044 Oral     SpO2 06/20/15 0044 93 %     Weight --      Height --      Head Cir --      Peak Flow --      Pain Score 06/20/15 0118 0     Pain Loc --      Pain Edu? --      Excl. in [place]? --     Constitutional: Alert and oriented. Well appearing and in Mild distress. Eyes: Conjunctivae are normal. PERRL. EOMI. Head: Atraumatic. Nose: No congestion/rhinnorhea. Mouth/Throat: Mucous membranes are moist.  Oropharynx non-erythematous. Cardiovascular: Normal rate, regular rhythm. Grossly normal heart sounds.  Good peripheral circulation. Respiratory: Normal respiratory effort.  No retractions. Lungs CTAB. Gastrointestinal: Soft and nontender. No distention. Positive bowel sounds Musculoskeletal: No lower extremity tenderness nor edema.   Neurologic:  Normal speech and language. Cranial nerves II through XII are grossly intact with no focal motor or neuro deficits. Skin:  Skin is warm, dry and intact.Marland Kitchen Psychiatric: Mood and affect are normal.  ____________________________________________   LABS (all labs ordered are listed, but only abnormal results are displayed)  Labs Reviewed  GLUCOSE, CAPILLARY - Abnormal; Notable for the following:    Glucose-Capillary 397 (*)    All other components within normal limits  CBC - Abnormal; Notable for the following:    RBC 3.65 (*)    Hemoglobin 11.0 (*)    HCT 32.6 (*)    All other components within normal limits  COMPREHENSIVE METABOLIC PANEL - Abnormal; Notable for the following:    Sodium 131 (*)    Chloride 98 (*)    Glucose, Bld 411 (*)    Creatinine, Ser 1.05 (*)    Calcium 8.6 (*)    Albumin 2.8 (*)    All other components within normal limits   GLUCOSE, CAPILLARY - Abnormal; Notable for the following:    Glucose-Capillary 322 (*)    All other components within normal limits  TROPONIN I  TROPONIN I  CBG MONITORING, ED   ____________________________________________  EKG  ED ECG REPORT I, Loney Hering, the attending physician, personally viewed and interpreted this ECG.   Date: 06/20/2015  EKG Time: 0043  Rate: 102  Rhythm: sinus tachycardia  Axis: normal  Intervals:none  ST&T Change: none  ____________________________________________  RADIOLOGY  none ____________________________________________   PROCEDURES  Procedure(s) performed: None  Critical Care performed:  No  ____________________________________________   INITIAL IMPRESSION / ASSESSMENT AND PLAN / ED COURSE  Pertinent labs & imaging results that were available during my care of the patient were reviewed by me and considered in my medical decision making (see chart for details).  This is a 50 year old female who comes into the hospital today with some elevated blood sugar. The patient has had other symptoms such as dizziness, lightheadedness and off balance with her walking. The patient had some blood work done that was unremarkable. Her initial troponin was negative. Her blood sugar here was 397 and then 411 on the CMP. The patient did receive a liter of normal saline. She is not orthostatic by evaluation as well. We will check a CT scan of the patient's head and reassess the patient.  I did order a CT head on the patient to evaluate her dizziness. The patient reports that she does not want the CT head. She reports that she is fine and she is feeling much better after the first liter of normal saline. I also ordered a repeat troponin. There was some difficulty drawing the troponin from the line. The patient reports that she refuses to get stuck again. At this point the patient is feeling better and she is refusing any further workup. The patient  reports she does have a doctor she can follow up with and she will follow up with her doctor. The patient will be discharged home to follow-up with her primary care physician. The patient has been encouraged to decrease or sweets and increase her fluid intake.  As we were beginning to get the patient ready for discharge as she is refusing a repeat of her troponin she had her IV pulled and her vital signs reassessed. It was noted that the patient's heart rate was now 107 and her blood pressure was 200/112. We did recheck the patient's blood pressure and it was in the 180s over lower 100s. The patient's heart rate remained at 107. I went into the patient's room to talk to her about the fact that she is still dehydrated and needs some more fluid resuscitation. The patient's repeat blood sugar is in the 300s. The patient reports that she still doesn't want any repeat troponin and her ride is coming to pick her up. I went ahead and wrote a prescription for the patient's metformin as well as for some HCTZ for her blood pressure. When we did contact the patient's right he questioned if the patient was given something that made her out of it and she reports that she took something that made her out of it prior to coming into the hospital. The patient refuses to stay to have her blood pressure improved. She refuses to day to receive more fluids. She continues to refuse the troponin as well as the CT scan. Patient also refuses to have her blood pressure re drawn. The patient will be discharged and encouraged to follow up with her primary care physician. ____________________________________________   FINAL CLINICAL IMPRESSION(S) / ED DIAGNOSES  Final diagnoses:  Hyperglycemia  Dizziness      Loney Hering, MD 06/20/15 [id number]  Loney Hering, MD 06/20/15 [phone]

## 2015-06-20 NOTE — Discharge Instructions (Signed)
Dizziness Dizziness is a common problem. It is a feeling of unsteadiness or light-headedness. You may feel like you are about to faint. Dizziness can lead to injury if you stumble or fall. Anyone can become dizzy, but dizziness is more common in older adults. This condition can be caused by a number of things, including medicines, dehydration, or illness. HOME CARE INSTRUCTIONS Taking these steps may help with your condition: Eating and Drinking  Drink enough fluid to keep your urine clear or pale yellow. This helps to keep you from becoming dehydrated. Try to drink more clear fluids, such as water.  Do not drink alcohol.  Limit your caffeine intake if directed by your health care provider.  Limit your salt intake if directed by your health care provider. Activity  Avoid making quick movements.  Rise slowly from chairs and steady yourself until you feel okay.  In the morning, first sit up on the side of the bed. When you feel okay, stand slowly while you hold onto something until you know that your balance is fine.  Move your legs often if you need to stand in one place for a long time. Tighten and relax your muscles in your legs while you are standing.  Do not drive or operate heavy machinery if you feel dizzy.  Avoid bending down if you feel dizzy. Place items in your home so that they are easy for you to reach without leaning over. Lifestyle  Do not use any tobacco products, including cigarettes, chewing tobacco, or electronic cigarettes. If you need help quitting, ask your health care provider.  Try to reduce your stress level, such as with yoga or meditation. Talk with your health care provider if you need help. General Instructions  Watch your dizziness for any changes.  Take medicines only as directed by your health care provider. Talk with your health care provider if you think that your dizziness is caused by a medicine that you are taking.  Tell a friend or a family  member that you are feeling dizzy. If he or she notices any changes in your behavior, have this person call your health care provider.  Keep all follow-up visits as directed by your health care provider. This is important. SEEK MEDICAL CARE IF:  Your dizziness does not go away.  Your dizziness or light-headedness gets worse.  You feel nauseous.  You have reduced hearing.  You have new symptoms.  You are unsteady on your feet or you feel like the room is spinning. SEEK IMMEDIATE MEDICAL CARE IF:  You vomit or have diarrhea and are unable to eat or drink anything.  You have problems talking, walking, swallowing, or using your arms, hands, or legs.  You feel generally weak.  You are not thinking clearly or you have trouble forming sentences. It may take a friend or family member to notice this.  You have chest pain, abdominal pain, shortness of breath, or sweating.  Your vision changes.  You notice any bleeding.  You have a headache.  You have neck pain or a stiff neck.  You have a fever.   This information is not intended to replace advice given to you by your health care provider. Make sure you discuss any questions you have with your health care provider.   Document Released: 08/07/2000 Document Revised: 06/28/2014 Document Reviewed: 02/07/2014 Elsevier Interactive Patient Education 2016 [employer].  Hyperglycemia Hyperglycemia occurs when the glucose (sugar) in your blood is too high. Hyperglycemia can happen for many  reasons, but it most often happens to people who do not know they have diabetes or are not managing their diabetes properly.  CAUSES  Whether you have diabetes or not, there are other causes of hyperglycemia. Hyperglycemia can occur when you have diabetes, but it can also occur in other situations that you might not be as aware of, such as: Diabetes  If you have diabetes and are having problems controlling your blood glucose, hyperglycemia could  occur because of some of the following reasons:  Not following your meal plan.  Not taking your diabetes medications or not taking it properly.  Exercising less or doing less activity than you normally do.  Being sick. Pre-diabetes  This cannot be ignored. Before people develop Type 2 diabetes, they almost always have "pre-diabetes." This is when your blood glucose levels are higher than normal, but not yet high enough to be diagnosed as diabetes. Research has shown that some long-term damage to the body, especially the heart and circulatory system, may already be occurring during pre-diabetes. If you take action to manage your blood glucose when you have pre-diabetes, you may delay or prevent Type 2 diabetes from developing. Stress  If you have diabetes, you may be "diet" controlled or on oral medications or insulin to control your diabetes. However, you may find that your blood glucose is higher than usual in the hospital whether you have diabetes or not. This is often referred to as "stress hyperglycemia." Stress can elevate your blood glucose. This happens because of hormones put out by the body during times of stress. If stress has been the cause of your high blood glucose, it can be followed regularly by your caregiver. That way he/she can make sure your hyperglycemia does not continue to get worse or progress to diabetes. Steroids  Steroids are medications that act on the infection fighting system (immune system) to block inflammation or infection. One side effect can be a rise in blood glucose. Most people can produce enough extra insulin to allow for this rise, but for those who cannot, steroids make blood glucose levels go even higher. It is not unusual for steroid treatments to "uncover" diabetes that is developing. It is not always possible to determine if the hyperglycemia will go away after the steroids are stopped. A special blood test called an A1c is sometimes done to determine if  your blood glucose was elevated before the steroids were started. SYMPTOMS  Thirsty.  Frequent urination.  Dry mouth.  Blurred vision.  Tired or fatigue.  Weakness.  Sleepy.  Tingling in feet or leg. DIAGNOSIS  Diagnosis is made by monitoring blood glucose in one or all of the following ways:  A1c test. This is a chemical found in your blood.  Fingerstick blood glucose monitoring.  Laboratory results. TREATMENT  First, knowing the cause of the hyperglycemia is important before the hyperglycemia can be treated. Treatment may include, but is not be limited to:  Education.  Change or adjustment in medications.  Change or adjustment in meal plan.  Treatment for an illness, infection, etc.  More frequent blood glucose monitoring.  Change in exercise plan.  Decreasing or stopping steroids.  Lifestyle changes. HOME CARE INSTRUCTIONS   Test your blood glucose as directed.  Exercise regularly. Your caregiver will give you instructions about exercise. Pre-diabetes or diabetes which comes on with stress is helped by exercising.  Eat wholesome, balanced meals. Eat often and at regular, fixed times. Your caregiver or nutritionist will give you a  meal plan to guide your sugar intake.  Being at an ideal weight is important. If needed, losing as little as 10 to 15 pounds may help improve blood glucose levels. SEEK MEDICAL CARE IF:   You have questions about medicine, activity, or diet.  You continue to have symptoms (problems such as increased thirst, urination, or weight gain). SEEK IMMEDIATE MEDICAL CARE IF:   You are vomiting or have diarrhea.  Your breath smells fruity.  You are breathing faster or slower.  You are very sleepy or incoherent.  You have numbness, tingling, or pain in your feet or hands.  You have chest pain.  Your symptoms get worse even though you have been following your caregiver's orders.  If you have any other questions or concerns.     This information is not intended to replace advice given to you by your health care provider. Make sure you discuss any questions you have with your health care provider.   Document Released: 08/07/2000 Document Revised: 05/06/2011 Document Reviewed: 10/18/2014 Elsevier Interactive Patient Education [employer].

## 2015-06-20 NOTE — ED Notes (Signed)
Unsuccessful IV start x2, ([place])

## 2015-07-11 ENCOUNTER — Encounter: Payer: Self-pay | Admitting: Internal Medicine

## 2015-08-11 ENCOUNTER — Emergency Department (HOSPITAL_BASED_OUTPATIENT_CLINIC_OR_DEPARTMENT_OTHER)
Admit: 2015-08-11 | Discharge: 2015-08-11 | Disposition: A | Discharge status: Home / Self Care | Payer: Self-pay | Attending: Emergency Medicine | Admitting: Emergency Medicine

## 2015-08-11 ENCOUNTER — Inpatient Hospital Stay (HOSPITAL_COMMUNITY)
Admission: EM | Admit: 2015-08-11 | Discharge: 2015-08-13 | DRG: 074 | Disposition: A | Discharge status: Home / Self Care | Payer: Self-pay | Attending: Internal Medicine | Admitting: Internal Medicine

## 2015-08-11 ENCOUNTER — Encounter (HOSPITAL_COMMUNITY): Payer: Self-pay | Admitting: *Deleted

## 2015-08-11 DIAGNOSIS — R809 Proteinuria, unspecified: Secondary | ICD-10-CM

## 2015-08-11 DIAGNOSIS — Z794 Long term (current) use of insulin: Secondary | ICD-10-CM

## 2015-08-11 DIAGNOSIS — M7989 Other specified soft tissue disorders: Secondary | ICD-10-CM

## 2015-08-11 DIAGNOSIS — IMO0002 Reserved for concepts with insufficient information to code with codable children: Secondary | ICD-10-CM | POA: Diagnosis present

## 2015-08-11 DIAGNOSIS — Z599 Problem related to housing and economic circumstances, unspecified: Secondary | ICD-10-CM

## 2015-08-11 DIAGNOSIS — E1129 Type 2 diabetes mellitus with other diabetic kidney complication: Secondary | ICD-10-CM | POA: Diagnosis present

## 2015-08-11 DIAGNOSIS — K3184 Gastroparesis: Secondary | ICD-10-CM | POA: Diagnosis present

## 2015-08-11 DIAGNOSIS — Z87891 Personal history of nicotine dependence: Secondary | ICD-10-CM

## 2015-08-11 DIAGNOSIS — I10 Essential (primary) hypertension: Secondary | ICD-10-CM | POA: Diagnosis present

## 2015-08-11 DIAGNOSIS — E119 Type 2 diabetes mellitus without complications: Secondary | ICD-10-CM | POA: Diagnosis present

## 2015-08-11 DIAGNOSIS — E1165 Type 2 diabetes mellitus with hyperglycemia: Secondary | ICD-10-CM | POA: Diagnosis present

## 2015-08-11 DIAGNOSIS — N390 Urinary tract infection, site not specified: Secondary | ICD-10-CM | POA: Diagnosis present

## 2015-08-11 DIAGNOSIS — R112 Nausea with vomiting, unspecified: Secondary | ICD-10-CM | POA: Diagnosis present

## 2015-08-11 DIAGNOSIS — R1110 Vomiting, unspecified: Secondary | ICD-10-CM

## 2015-08-11 DIAGNOSIS — E1143 Type 2 diabetes mellitus with diabetic autonomic (poly)neuropathy: Principal | ICD-10-CM | POA: Diagnosis present

## 2015-08-11 HISTORY — DX: Obesity, unspecified: E66.9

## 2015-08-11 LAB — CBC
HEMATOCRIT: 32 % — AB (ref 36.0–46.0)
Hemoglobin: 10.7 g/dL — ABNORMAL LOW (ref 12.0–15.0)
MCH: 28.8 pg (ref 26.0–34.0)
MCHC: 33.4 g/dL (ref 30.0–36.0)
MCV: 86 fL (ref 78.0–100.0)
Platelets: 344 10*3/uL (ref 150–400)
RBC: 3.72 MIL/uL — ABNORMAL LOW (ref 3.87–5.11)
RDW: 12.2 % (ref 11.5–15.5)
WBC: 7.8 10*3/uL (ref 4.0–10.5)

## 2015-08-11 LAB — URINALYSIS, ROUTINE W REFLEX MICROSCOPIC
BILIRUBIN URINE: NEGATIVE
KETONES UR: 15 mg/dL — AB
NITRITE: NEGATIVE
PH: 6.5 (ref 5.0–8.0)
Specific Gravity, Urine: 1.015 (ref 1.005–1.030)

## 2015-08-11 LAB — COMPREHENSIVE METABOLIC PANEL
ALBUMIN: 3.4 g/dL — AB (ref 3.5–5.0)
ALT: 12 U/L — ABNORMAL LOW (ref 14–54)
AST: 19 U/L (ref 15–41)
Alkaline Phosphatase: 75 U/L (ref 38–126)
Anion gap: 6 (ref 5–15)
BILIRUBIN TOTAL: 0.7 mg/dL (ref 0.3–1.2)
BUN: 11 mg/dL (ref 6–20)
CO2: 26 mmol/L (ref 22–32)
Calcium: 9.2 mg/dL (ref 8.9–10.3)
Chloride: 104 mmol/L (ref 101–111)
Creatinine, Ser: 0.91 mg/dL (ref 0.44–1.00)
GFR calc Af Amer: >60 mL/min (ref >60)
GFR calc non Af Amer: >60 mL/min (ref >60)
GLUCOSE: 225 mg/dL — AB (ref 65–99)
POTASSIUM: 3.8 mmol/L (ref 3.5–5.1)
Sodium: 136 mmol/L (ref 135–145)
TOTAL PROTEIN: 7.9 g/dL (ref 6.5–8.1)

## 2015-08-11 LAB — I-STAT BETA HCG BLOOD, ED (MC, WL, AP ONLY): I-stat hCG, quantitative: <5 m[IU]/mL (ref <5)

## 2015-08-11 LAB — LIPASE, BLOOD: Lipase: 29 U/L (ref 11–51)

## 2015-08-11 LAB — URINE MICROSCOPIC-ADD ON

## 2015-08-11 LAB — CBG MONITORING, ED: GLUCOSE-CAPILLARY: 217 mg/dL — AB (ref 65–99)

## 2015-08-11 MED ORDER — SODIUM CHLORIDE 0.9 % IV BOLUS (SEPSIS)
1000.0000 mL | Freq: Once | INTRAVENOUS | Status: AC
Start: 2015-08-11 — End: 2015-08-11
  Administered 2015-08-11: 1000 mL via INTRAVENOUS

## 2015-08-11 MED ORDER — ONDANSETRON 4 MG PO TBDP
8.0000 mg | ORAL_TABLET | Freq: Once | ORAL | Status: AC
  Administered 2015-08-11: 8 mg via ORAL
  Filled 2015-08-11: qty 2

## 2015-08-11 MED ORDER — DEXTROSE 5 % IV SOLN
1.0000 g | Freq: Once | INTRAVENOUS | Status: AC
  Administered 2015-08-11: 1 g via INTRAVENOUS
  Filled 2015-08-11: qty 10

## 2015-08-11 MED ORDER — METOCLOPRAMIDE HCL 5 MG/ML IJ SOLN
10.0000 mg | Freq: Once | INTRAMUSCULAR | Status: AC
  Administered 2015-08-11: 10 mg via INTRAMUSCULAR
  Filled 2015-08-11: qty 2

## 2015-08-11 NOTE — ED Notes (Signed)
She is feeling better sleeping each time i check on her

## 2015-08-11 NOTE — ED Provider Notes (Signed)
CSN: [id number]     Arrival date & time 08/11/15  1534 History   First MD Initiated Contact with Patient 08/11/15 1755     Chief Complaint  Patient presents with  . Leg Pain  . Emesis     (Consider location/radiation/quality/duration/timing/severity/associated sxs/prior Treatment) HPI [name] is a(n) 50 y.o. female who presents with cc of  Nausea and emesis. Onset 3 days ago. About 5 episodes daily without abdominal pain. Unable to hold down foods or fluids. No med, however peppermint has helped her nausea.  She has a hx of gastritis. She haas had a similar episode to this and came to the ED, was given antiemetics and felt better. She has also noticed swelling in the LLE which is new. She said it happened about 1 month ago as well and resolved after an epsom salt soak. Denies fevers, chills, myalgias, arthralgias. Denies DOE, SOB, chest tightness or pressure, radiation to left arm, jaw or back, or diaphoresis. Denies dysuria, flank pain, suprapubic pain, frequency, urgency, or hematuria. Denies headaches, light headedness, weakness, visual disturbances. Denies diarrhea or constipation.   Past Medical History  Diagnosis Date  . Diabetes mellitus   . Asthma     Child  . Obesity    Past Surgical History  Procedure Laterality Date  . Cholecystectomy    . Tubal ligation     Family History  Problem Relation Age of Onset  . Diabetes Mother    Social History  Substance Use Topics  . Smoking status: Former Smoker -- 0.00 packs/day  . Smokeless tobacco: None     Comment: Smoked when she was a teenager.   . Alcohol Use: No   OB History    No data available     Review of Systems  Ten systems reviewed and are negative for acute change, except as noted in the HPI.    Allergies  Penicillins  Home Medications   Prior to Admission medications   Medication Sig Start Date End Date Taking? Authorizing Provider  hydrochlorothiazide (HYDRODIURIL) 25 MG tablet Take 1 tablet  (25 mg total) by mouth daily. 06/20/15   Loney Hering, MD  HYDROcodone-acetaminophen (NORCO) 5-325 MG per tablet Take 1 tablet by mouth every 6 (six) hours as needed. Patient taking differently: Take 1 tablet by mouth every 6 (six) hours as needed for moderate pain.  09/16/14   Hope Bunnie Pion, NP  ibuprofen (ADVIL,MOTRIN) 600 MG tablet Take 1 tablet (600 mg total) by mouth every 6 (six) hours as needed. 05/07/15   Tatyana Kirichenko, [name]  insulin glargine (LANTUS) 100 UNIT/ML injection Inject 0.2 mLs (20 Units total) into the skin daily. 03/06/15   Lavina Hamman, MD  Lactobacillus (ACIDOPHILUS PROBIOTIC) 100 MG CAPS Take 1 capsule (100 mg total) by mouth 2 (two) times daily. 03/06/15   Lavina Hamman, MD  loratadine (CLARITIN) 10 MG tablet Take 1 tablet (10 mg total) by mouth daily. Patient taking differently: Take 10 mg by mouth daily as needed for allergies.  03/08/14   Gareth Morgan, MD  metFORMIN (GLUCOPHAGE) 500 MG tablet Take 1 tablet (500 mg total) by mouth 2 (two) times daily with a meal. 07/29/14   Comer Locket, [name]  metFORMIN (GLUCOPHAGE) 500 MG tablet Take 1 tablet (500 mg total) by mouth 2 (two) times daily with a meal. 06/20/15 06/19/16  Loney Hering, MD  polyethylene glycol powder (GLYCOLAX/MIRALAX) powder Take 17 g by mouth 2 (two) times daily. Until daily soft stools  OTC Patient  taking differently: Take 17 g by mouth daily as needed for mild constipation. Until daily soft stools  OTC 07/29/14   Comer Locket, [name]   BP 164/108 mmHg  Pulse 100  Temp(Src) 99.1 F (37.3 C) (Oral)  Resp 18  SpO2 99%  LMP 05/27/2015 (Approximate) Physical Exam  Constitutional: She is oriented to person, place, and time. She appears well-developed and well-nourished. No distress.  Appears uncomfortable   HENT:  Head: Normocephalic.  Eyes: EOM are normal. Pupils are equal, round, and reactive to light. No scleral icterus.  Neck: Normal range of motion. No JVD present.  Cardiovascular:  Regular rhythm and normal heart sounds.   No murmur heard. Tachycardic Non-pitting edema LLE. Normal pulses  Pulmonary/Chest: Breath sounds normal. No respiratory distress. She has no wheezes.  Abdominal: Soft. Bowel sounds are normal. She exhibits no distension and no mass. There is no tenderness. There is no guarding.  Musculoskeletal: Normal range of motion.  Neurological: She is alert and oriented to person, place, and time.  Skin: Skin is warm and dry. She is not diaphoretic.  Nursing note and vitals reviewed.   ED Course  Procedures (including critical care time) Labs Review Labs Reviewed  COMPREHENSIVE METABOLIC PANEL - Abnormal; Notable for the following:    Glucose, Bld 225 (*)    Albumin 3.4 (*)    ALT 12 (*)    All other components within normal limits  CBC - Abnormal; Notable for the following:    RBC 3.72 (*)    Hemoglobin 10.7 (*)    HCT 32.0 (*)    All other components within normal limits  CBG MONITORING, ED - Abnormal; Notable for the following:    Glucose-Capillary 217 (*)    All other components within normal limits  LIPASE, BLOOD  URINALYSIS, ROUTINE W REFLEX MICROSCOPIC (NOT AT [facility])  I-STAT BETA HCG BLOOD, ED ([facility], [facility], [facility] ONLY)    Imaging Review No results found. I have personally reviewed and evaluated these images and lab results as part of my medical decision-making.   EKG Interpretation None      MDM   Final diagnoses:  Intractable vomiting with nausea, vomiting of unspecified type  UTI (lower urinary tract infection)  Essential hypertension    Patient with nausea and vomiting. UA appears to be positive Treateing with Rocephin. Patient continues to feel nauseated.    12:07 AM  Filed Vitals:   08/11/15 2000 08/11/15 2030 08/11/15 2100 08/11/15 2230  BP: 180/99 201/120 193/102 190/103  Pulse: 99 112 102 107  Temp:      TempSrc:      Resp:      SpO2: 93% 96% 96% 97%   Patient given fluids, zofran, and compazine.  She is pain  free bur continues to feel nauseated.  her blood pressure is elevated.   Patient will be admitted for intractable vomiting.  I have spoken with dr.Gardner who agrees with the plan.     Margarita Mail, [name] 08/12/15 0025  Dorie Rank, MD 08/12/15 [phone]

## 2015-08-11 NOTE — ED Notes (Signed)
The pt has high bp but has been out of meds for awhile

## 2015-08-11 NOTE — ED Notes (Signed)
Pt given ice started vomiting again

## 2015-08-11 NOTE — ED Notes (Signed)
The pt is c/o lt lower leg pain and abd pain for 2-3 days  With nv  lmp  April  No known injury

## 2015-08-11 NOTE — Progress Notes (Signed)
VASCULAR LAB PRELIMINARY  PRELIMINARY  PRELIMINARY  PRELIMINARY  Left lower extremity venous duplex completed.    Preliminary report:  Left:  No evidence of DVT, superficial thrombosis, or Baker's cyst.  [name], RVS 08/11/2015, 7:49 PM

## 2015-08-11 NOTE — ED Notes (Signed)
Pt has multiple complaints. Reports swelling and pain to left lower leg, denies injury. Having abd pain and n/v, feeling weak and lighthheaded.

## 2015-08-11 NOTE — ED Notes (Signed)
Pt asked for a urine spec  She reports that she voided  When she arrived and cannot do any more now  Vomiting at present

## 2015-08-11 NOTE — ED Notes (Signed)
Ultra sound here to do doppler studay  Unable to do iv

## 2015-08-12 DIAGNOSIS — E119 Type 2 diabetes mellitus without complications: Secondary | ICD-10-CM | POA: Diagnosis present

## 2015-08-12 DIAGNOSIS — R112 Nausea with vomiting, unspecified: Secondary | ICD-10-CM | POA: Diagnosis present

## 2015-08-12 DIAGNOSIS — R809 Proteinuria, unspecified: Secondary | ICD-10-CM

## 2015-08-12 DIAGNOSIS — E1165 Type 2 diabetes mellitus with hyperglycemia: Secondary | ICD-10-CM

## 2015-08-12 DIAGNOSIS — IMO0002 Reserved for concepts with insufficient information to code with codable children: Secondary | ICD-10-CM | POA: Diagnosis present

## 2015-08-12 DIAGNOSIS — E1129 Type 2 diabetes mellitus with other diabetic kidney complication: Secondary | ICD-10-CM | POA: Diagnosis present

## 2015-08-12 DIAGNOSIS — I10 Essential (primary) hypertension: Secondary | ICD-10-CM | POA: Diagnosis present

## 2015-08-12 LAB — GLUCOSE, CAPILLARY
GLUCOSE-CAPILLARY: 300 mg/dL — AB (ref 65–99)
GLUCOSE-CAPILLARY: 254 mg/dL — AB (ref 65–99)
Glucose-Capillary: 272 mg/dL — ABNORMAL HIGH (ref 65–99)
Glucose-Capillary: 143 mg/dL — ABNORMAL HIGH (ref 65–99)
Glucose-Capillary: 142 mg/dL — ABNORMAL HIGH (ref 65–99)

## 2015-08-12 MED ORDER — HYDROCHLOROTHIAZIDE 25 MG PO TABS: 25.0000 mg | ORAL_TABLET | Freq: Every day | ORAL | Status: DC

## 2015-08-12 MED ORDER — AMLODIPINE BESYLATE 5 MG PO TABS
5.0000 mg | ORAL_TABLET | Freq: Every day | ORAL | Status: DC
  Administered 2015-08-12 – 2015-08-13 (×2): 5 mg via ORAL
  Filled 2015-08-12: qty 1

## 2015-08-12 MED ORDER — METOCLOPRAMIDE HCL 5 MG/ML IJ SOLN
5.0000 mg | Freq: Four times a day (QID) | INTRAMUSCULAR | Status: DC
  Administered 2015-08-12 – 2015-08-13 (×5): 5 mg via INTRAVENOUS
  Filled 2015-08-12 (×5): qty 2

## 2015-08-12 MED ORDER — ENOXAPARIN SODIUM 60 MG/0.6ML ~~LOC~~ SOLN
60.0000 mg | SUBCUTANEOUS | Status: DC
  Filled 2015-08-12: qty 0.6

## 2015-08-12 MED ORDER — HYDRALAZINE HCL 20 MG/ML IJ SOLN
10.0000 mg | Freq: Four times a day (QID) | INTRAMUSCULAR | Status: DC | PRN
Start: 2015-08-12 — End: 2015-08-13
  Administered 2015-08-12: 10 mg via INTRAVENOUS
  Filled 2015-08-12: qty 1

## 2015-08-12 MED ORDER — INSULIN GLARGINE 100 UNIT/ML ~~LOC~~ SOLN
20.0000 [IU] | Freq: Every day | SUBCUTANEOUS | Status: DC
  Administered 2015-08-12 (×2): 20 [IU] via SUBCUTANEOUS
  Filled 2015-08-12 (×3): qty 0.2

## 2015-08-12 MED ORDER — HYDRALAZINE HCL 20 MG/ML IJ SOLN: 5.0000 mg | INTRAMUSCULAR | Status: DC | PRN

## 2015-08-12 MED ORDER — AMLODIPINE BESYLATE 5 MG PO TABS
5.0000 mg | ORAL_TABLET | Freq: Every day | ORAL | Status: DC
  Administered 2015-08-12: 5 mg via ORAL
  Filled 2015-08-12 (×2): qty 1

## 2015-08-12 MED ORDER — METFORMIN HCL 500 MG PO TABS
500.0000 mg | ORAL_TABLET | Freq: Two times a day (BID) | ORAL | Status: DC
  Administered 2015-08-12 – 2015-08-13 (×3): 500 mg via ORAL
  Filled 2015-08-12 (×3): qty 1

## 2015-08-12 MED ORDER — LABETALOL HCL 100 MG PO TABS
100.0000 mg | ORAL_TABLET | Freq: Two times a day (BID) | ORAL | Status: DC
  Administered 2015-08-12 – 2015-08-13 (×3): 100 mg via ORAL
  Filled 2015-08-12 (×3): qty 1

## 2015-08-12 MED ORDER — LIVING WELL WITH DIABETES BOOK
Freq: Once | Status: AC
Start: 2015-08-12 — End: 2015-08-12
  Administered 2015-08-12: 1
  Filled 2015-08-12: qty 1

## 2015-08-12 MED ORDER — INSULIN ASPART 100 UNIT/ML ~~LOC~~ SOLN
0.0000 [IU] | Freq: Three times a day (TID) | SUBCUTANEOUS | Status: DC
  Administered 2015-08-12: 2 [IU] via SUBCUTANEOUS
  Administered 2015-08-12 (×2): 8 [IU] via SUBCUTANEOUS
  Administered 2015-08-13: 5 [IU] via SUBCUTANEOUS

## 2015-08-12 NOTE — Progress Notes (Signed)
[name] is a 50 y.o. female with medical history significant of DM2, HTN, poorly controlled, presents with nausea, vomiting, probably secondary to gastroparesis. GES could not be done over the  Weekend and she was already given a dose of reglan while in ED. She was admitted for symptomatic management.   ON Exam: Pt is alert and comfortable, reports her symptoms have improved.  CVS s1s2, no murmers, rubs or gallops.  Lungs: clear to auscultation, no wheezing or rhonchi Abdomen: soft non tender non distended bowel sounds heard.  Extremities: no pedal edema, cyanosis or clubbing.   Plan: Intractable Nausea vomiting: Probably from gastroparesis.  Advance diet and resume reglan.  Please discharge pt home on reglan .   Hypertensive urgency: Started on norvasc, labetalol and hydralazine prn . She will need prescriptions for the anti hypertensive meds.    Diabetes Mellitus: CBG (last 3)   Recent Labs  08/12/15 0223 08/12/15 0820 08/12/15 1205  GLUCAP 272* 300* 254*    Resume SSI, started on Lantus.  DM co ordinater consulted.    Hosie Poisson, MD [phone]

## 2015-08-12 NOTE — ED Notes (Signed)
Admitting doctor at the bedside 

## 2015-08-12 NOTE — Progress Notes (Signed)
BP still elevated, Dr. Karleen Hampshire texted to see if wants to order PRN.  Pt given diabetes booklet and informed about watching educational videos on TV, she wants to finish watching her movie first.

## 2015-08-12 NOTE — H&P (Signed)
History and Physical    [name] [id number] DOB: May 03, 1965 DOA: 08/11/2015   PCP: Maren Reamer, MD Chief Complaint:  Chief Complaint  Patient presents with  . Leg Pain  . Emesis    HPI: [name] is a 50 y.o. female with medical history significant of DM2, HTN, both of these have been very poorly controlled, her DM has had A1Cs of about 10 or more for the past 6 years, most recently it was 12.9 five months ago.  She is taking only 500mg  metformin for the DM due to financial difficulties.  Her HTN has been completely untreated due to running out of the HCTZ 2 months ago.  Patient presents to the ED with uncontrolled N/V.  Symptoms onset 3 days ago, 5 episodes of vomiting daily, no abdominal pain.  Unable to hold down solids or fluids.  Had similar episode that lasted 10 days a couple of months ago, and also last year had episode.  ED Course: Labs most remarkable for frank proteinuria, uncontrolled DM, and uncontrolled HTN.  Review of Systems: As per HPI otherwise 10 point review of systems negative.    Past Medical History  Diagnosis Date  . Diabetes mellitus   . Asthma     Child  . Obesity     Past Surgical History  Procedure Laterality Date  . Cholecystectomy    . Tubal ligation       reports that she has quit smoking. She does not have any smokeless tobacco history on file. She reports that she does not drink alcohol or use illicit drugs.  Allergies  Allergen Reactions  . Penicillins Rash    Family History  Problem Relation Age of Onset  . Diabetes Mother       Prior to Admission medications   Medication Sig Start Date End Date Taking? Authorizing Provider  metFORMIN (GLUCOPHAGE) 500 MG tablet Take 1 tablet (500 mg total) by mouth 2 (two) times daily with a meal. 07/29/14  Yes Comer Locket, [name]  hydrochlorothiazide (HYDRODIURIL) 25 MG tablet Take 1 tablet (25 mg total) by mouth daily. Patient not taking: Reported on 08/11/2015 06/20/15    Loney Hering, MD  insulin glargine (LANTUS) 100 UNIT/ML injection Inject 0.2 mLs (20 Units total) into the skin daily. 03/06/15   Lavina Hamman, MD    Physical Exam: Filed Vitals:   08/11/15 2000 08/11/15 2030 08/11/15 2100 08/11/15 2230  BP: 180/99 201/120 193/102 190/103  Pulse: 99 112 102 107  Temp:      TempSrc:      Resp:      SpO2: 93% 96% 96% 97%      Constitutional: NAD, calm, comfortable Eyes: PERRL, lids and conjunctivae normal ENMT: Mucous membranes are moist. Posterior pharynx clear of any exudate or lesions.Normal dentition.  Neck: normal, supple, no masses, no thyromegaly Respiratory: clear to auscultation bilaterally, no wheezing, no crackles. Normal respiratory effort. No accessory muscle use.  Cardiovascular: Regular rate and rhythm, no murmurs / rubs / gallops. No extremity edema. 2+ pedal pulses. No carotid bruits.  Abdomen: no tenderness, no masses palpated. No hepatosplenomegaly. Bowel sounds positive.  Musculoskeletal: no clubbing / cyanosis. No joint deformity upper and lower extremities. Good ROM, no contractures. Normal muscle tone.  Skin: no rashes, lesions, ulcers. No induration Neurologic: CN 2-12 grossly intact. Sensation intact, DTR normal. Strength 5/5 in all 4.  Psychiatric: Normal judgment and insight. Alert and oriented x 3. Normal mood.    Labs on Admission: I have  personally reviewed following labs and imaging studies  CBC:  Recent Labs Lab 08/11/15 1543  WBC 7.8  HGB 10.7*  HCT 32.0*  MCV 86.0  PLT [id number]   Basic Metabolic Panel:  Recent Labs Lab 08/11/15 1543  NA 136  K 3.8  CL 104  CO2 26  GLUCOSE 225*  BUN 11  CREATININE 0.91  CALCIUM 9.2   GFR: CrCl cannot be calculated (Unknown ideal weight.). Liver Function Tests:  Recent Labs Lab 08/11/15 1543  AST 19  ALT 12*  ALKPHOS 75  BILITOT 0.7  PROT 7.9  ALBUMIN 3.4*    Recent Labs Lab 08/11/15 1543  LIPASE 29   No results for input(s): AMMONIA in the  last 168 hours. Coagulation Profile: No results for input(s): INR, PROTIME in the last 168 hours. Cardiac Enzymes: No results for input(s): CKTOTAL, CKMB, CKMBINDEX, TROPONINI in the last 168 hours. BNP (last 3 results) No results for input(s): PROBNP in the last 8760 hours. HbA1C: No results for input(s): HGBA1C in the last 72 hours. CBG:  Recent Labs Lab 08/11/15 1548  GLUCAP 217*   Lipid Profile: No results for input(s): CHOL, HDL, LDLCALC, TRIG, CHOLHDL, LDLDIRECT in the last 72 hours. Thyroid Function Tests: No results for input(s): TSH, T4TOTAL, FREET4, T3FREE, THYROIDAB in the last 72 hours. Anemia Panel: No results for input(s): VITAMINB12, FOLATE, FERRITIN, TIBC, IRON, RETICCTPCT in the last 72 hours. Urine analysis:    Component Value Date/Time   COLORURINE YELLOW 08/11/2015 2136   APPEARANCEUR TURBID* 08/11/2015 2136   LABSPEC 1.015 08/11/2015 2136   PHURINE 6.5 08/11/2015 2136   GLUCOSEU >1000* 08/11/2015 2136   HGBUR MODERATE* 08/11/2015 2136   BILIRUBINUR NEGATIVE 08/11/2015 2136   KETONESUR 15* 08/11/2015 2136   PROTEINUR >300* 08/11/2015 2136   UROBILINOGEN 0.2 09/08/2014 1521   NITRITE NEGATIVE 08/11/2015 2136   LEUKOCYTESUR SMALL* 08/11/2015 2136   Sepsis Labs: @LABRCNTIP (procalcitonin:4,lacticidven:4) )No results found for this or any previous visit (from the past 240 hour(s)).   Radiological Exams on Admission: No results found.  EKG: Independently reviewed.  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  DM2 - uncontrolled and likely causing her proteinuria -  Currently only taking metformin 500 for the DM  Will add lantus 20 QHS  Will add moderate dose SSI AC/HS  Diabetes coordinator consult  Case management consult regarding ability to pay for / get meds  Ultimately likely needs follow up with nephrology, but once again, I would be happy if we could just get her to see her PCP more often.  HTN -  Will stop HCTZ  Starting norvasc for now.  Ultimately likely will need a combo drug, but until we know she will go to follow up, dont want to start lisinopril yet as this needs monitoring of labs   DVT prophylaxis: Lovenox Code Status: Full Family Communication: No family in room Consults called: None Admission status: Admit to obs   [place], [place]  [facility] Pager [phone] from 7PM-7AM  If 7AM-7PM, please contact the day physician for the patient [email or web address] Password [email or web address]  08/12/2015, 12:45 AM

## 2015-08-12 NOTE — ED Notes (Signed)
Report called to rn

## 2015-08-12 NOTE — Progress Notes (Signed)
Inpatient Diabetes Program Recommendations  AACE/ADA: New Consensus Statement on Inpatient Glycemic Control (2015)  Target Ranges:  Prepandial:   less than 140 mg/dL      Peak postprandial:   less than 180 mg/dL (1-2 hours)      Critically ill patients:  140 - 180 mg/dL   Lab Results  Component Value Date   GLUCAP 300* 08/12/2015   HGBA1C 12.9* 03/05/2015    Review of Glycemic Control  Inpatient Diabetes Program Recommendations:   Agree with current insulin orders. Basic bedside education will be ordered for RN to initiate.  Thank you  Raoul Pitch MSN, RN,CDE Inpatient Diabetes Coordinator [phone] (team pager)

## 2015-08-13 DIAGNOSIS — E1121 Type 2 diabetes mellitus with diabetic nephropathy: Secondary | ICD-10-CM

## 2015-08-13 DIAGNOSIS — R809 Proteinuria, unspecified: Secondary | ICD-10-CM

## 2015-08-13 DIAGNOSIS — I10 Essential (primary) hypertension: Secondary | ICD-10-CM

## 2015-08-13 DIAGNOSIS — E1165 Type 2 diabetes mellitus with hyperglycemia: Secondary | ICD-10-CM

## 2015-08-13 DIAGNOSIS — G43A1 Cyclical vomiting, intractable: Secondary | ICD-10-CM

## 2015-08-13 DIAGNOSIS — E1129 Type 2 diabetes mellitus with other diabetic kidney complication: Secondary | ICD-10-CM

## 2015-08-13 LAB — GLUCOSE, CAPILLARY
Glucose-Capillary: 233 mg/dL — ABNORMAL HIGH (ref 65–99)
Glucose-Capillary: 155 mg/dL — ABNORMAL HIGH (ref 65–99)

## 2015-08-13 MED ORDER — CIPROFLOXACIN IN D5W 400 MG/200ML IV SOLN
400.0000 mg | Freq: Two times a day (BID) | INTRAVENOUS | Status: DC
  Filled 2015-08-13 (×2): qty 200

## 2015-08-13 MED ORDER — LISINOPRIL-HYDROCHLOROTHIAZIDE 10-12.5 MG PO TABS: 1.0000 | ORAL_TABLET | Freq: Every day | ORAL | Status: DC

## 2015-08-13 MED ORDER — LISINOPRIL 5 MG PO TABS: 5.0000 mg | ORAL_TABLET | Freq: Every day | ORAL | Status: DC

## 2015-08-13 MED ORDER — METFORMIN HCL 850 MG PO TABS: 850.0000 mg | ORAL_TABLET | Freq: Two times a day (BID) | ORAL | Status: DC

## 2015-08-13 NOTE — Care Management Note (Signed)
Case Management Note  Patient Details  Name: Vayla Domeier MRN: [id number] Date of Birth: [birth year]  Subjective/Objective:                    Action/Plan:   Expected Discharge Date:  08/15/15               Expected Discharge Plan:     In-House Referral:     Discharge planning Services     Post Acute Care Choice:    Choice offered to:     DME Arranged:    DME Agency:     HH Arranged:    [place] Agency:     Status of Service:     Medicare Important Message Given:    Date Medicare IM Given:    Medicare IM give by:    Date Additional Medicare IM Given:    Additional Medicare Important Message give by:     If discussed at [place] of Stay Meetings, dates discussed:    Additional Comments:  Dellie Catholic, RN 08/13/2015, 3:36 PM

## 2015-08-13 NOTE — Progress Notes (Signed)
CM received call requesting a [place] letter for pt going home with metformin and lisinopril prescriptions.  Both medications are on the $4 list at [facility].  CM encouraged to go the [facility] on Thursday or Friday morning at 08:30 during [facility] to secure a PCP, insurance, and follow up medical care (On AVS).  No other CM needs were communicated.

## 2015-08-13 NOTE — Discharge Instructions (Signed)
Follow with Primary MD Maren Reamer, MD in 7 days   Get CBC, CMP,  checked  by Primary MD next visit.    Activity: As tolerated with Full fall precautions use walker/cane & assistance as needed   Disposition Home    Diet: Heart Healthy , Carb modified , with feeding assistance and aspiration precautions.  For Heart failure patients - Check your Weight same time everyday, if you gain over 2 pounds, or you develop in leg swelling, experience more shortness of breath or chest pain, call your Primary MD immediately. Follow Cardiac Low Salt Diet and 1.5 lit/day fluid restriction.   On your next visit with your primary care physician please Get Medicines reviewed and adjusted.   Please request your Prim.MD to go over all Hospital Tests and Procedure/Radiological results at the follow up, please get all Hospital records sent to your Prim MD by signing hospital release before you go home.   If you experience worsening of your admission symptoms, develop shortness of breath, life threatening emergency, suicidal or homicidal thoughts you must seek medical attention immediately by calling 911 or calling your MD immediately  if symptoms less severe.  You Must read complete instructions/literature along with all the possible adverse reactions/side effects for all the Medicines you take and that have been prescribed to you. Take any new Medicines after you have completely understood and accpet all the possible adverse reactions/side effects.   Do not drive, operating heavy machinery, perform activities at heights, swimming or participation in water activities or provide baby sitting services if your were admitted for syncope or siezures until you have seen by Primary MD or a Neurologist and advised to do so again.  Do not drive when taking Pain medications.    Do not take more than prescribed Pain, Sleep and Anxiety Medications  Special Instructions: If you have smoked or chewed Tobacco  in  the last 2 yrs please stop smoking, stop any regular Alcohol  and or any Recreational drug use.  Wear Seat belts while driving.   Please note  You were cared for by a hospitalist during your hospital stay. If you have any questions about your discharge medications or the care you received while you were in the hospital after you are discharged, you can call the unit and asked to speak with the hospitalist on call if the hospitalist that took care of you is not available. Once you are discharged, your primary care physician will handle any further medical issues. Please note that NO REFILLS for any discharge medications will be authorized once you are discharged, as it is imperative that you return to your primary care physician (or establish a relationship with a primary care physician if you do not have one) for your aftercare needs so that they can reassess your need for medications and monitor your lab values.

## 2015-08-13 NOTE — Discharge Summary (Signed)
[name], is a 50 y.o. female  DOB Apr 22, 1965  MRN [id number].  Admission date:  08/11/2015  Admitting Physician  Etta Quill, DO  Discharge Date:  08/13/2015   Primary MD  Maren Reamer, MD  Recommendations for primary care physician for things to follow:  - Please check CBC, BMP during next visit   Admission Diagnosis  UTI (lower urinary tract infection) [N39.0] Essential hypertension [I10] Intractable vomiting with nausea, vomiting of unspecified type [R11.10]   Discharge Diagnosis  UTI (lower urinary tract infection) [N39.0] Essential hypertension [I10] Intractable vomiting with nausea, vomiting of unspecified type [R11.10]   Principal Problem:   Intractable nausea and vomiting Active Problems:   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 Diabetes mellitus   . Asthma     Child  . Obesity     Past Surgical History  Procedure Laterality Date  . Cholecystectomy    . Tubal ligation         History of present illness and  Hospital Course:     Kindly see H&P for history of present illness and admission details, please review complete Labs, Consult reports and Test reports for all details in brief  HPI  from the history and physical done on the day of admission 08/12/2015  HPI: [name] is a 50 y.o. female with medical history significant of DM2, HTN, both of these have been very poorly controlled, her DM has had A1Cs of about 10 or more for the past 6 years, most recently it was 12.9 five months ago. She is taking only 500mg  metformin for the DM due to financial difficulties. Her HTN has been completely untreated due to running out of the HCTZ 2 months ago.  Patient presents to the ED with uncontrolled N/V. Symptoms  onset 3 days ago, 5 episodes of vomiting daily, no abdominal pain. Unable to hold down solids or fluids. Had similar episode that lasted 10 days a couple of months ago, and also last year had episode.  ED Course: Labs most remarkable for frank proteinuria, uncontrolled DM, and uncontrolled HTN.  Hospital Course   Intractable nausea and vomiting - Possibly due to gastroparesis, overall she has poorly controlled diabetes mellitus, she was started on scheduled Reglan, tolerating diet very well, tolerating breakfast and lunch today denies any further nausea and vomiting  Diabetes mellitus type 2 with proteinuria - Poorly controlled, she is on metformin 400 mg twice a day, will increased to metformin 850 twice a day, be further managed by PCP, but he likely will need to go on full dose metformin and may add another oral hypoglycemic agent. - Started on low dose lisinopril giving her proteinuria  Hypertension - Blood pressure uncontrolled on admission, better controlled after initiation of amlodipine and labetalol, giving her proteinuria will start on low-dose lisinopril, she will be discharged on lisinopril/hydrochlorothiazide.  -  she was informed both her metformin and lisinopril/hydrochlorothiazide discovered on [facility] $4 list, reports she can afford that and she was given prescription with 1 refill.   Discharge Condition:  stable   Follow UP  Follow-up Information    Follow up with Maren Reamer, MD. Schedule an appointment as soon as possible for a visit in 1 week.   Specialty:  Internal Medicine   Contact information:   [place] [address] [phone]         Discharge Instructions  and  Discharge Medications    Discharge Instructions    Increase activity slowly    Complete by:  As directed             Medication List    STOP taking these medications        hydrochlorothiazide 25 MG tablet  Commonly known as:  HYDRODIURIL     insulin  glargine 100 UNIT/ML injection  Commonly known as:  LANTUS      TAKE these medications        lisinopril-hydrochlorothiazide 10-12.5 MG tablet  Commonly known as:  PRINZIDE,ZESTORETIC  Take 1 tablet by mouth daily.     metFORMIN 850 MG tablet  Commonly known as:  GLUCOPHAGE  Take 1 tablet (850 mg total) by mouth 2 (two) times daily with a meal.          Diet and Activity recommendation: See Discharge Instructions above   Consults obtained -  none   Major procedures and Radiology Reports - PLEASE review detailed and final reports for all details, in brief -      No results found.  Micro Results     No results found for this or any previous visit (from the past 240 hour(s)).     Today   Subjective:   [name] today has no headache,no chest or abdominal pain, denies any further nausea and vomiting, tolerating by mouth intake, feels much better wants to go home today.   Objective:   Blood pressure 115/61, pulse 94, temperature 98.5 F (36.9 C), temperature source Oral, resp. rate 19, height 5\' 6"  (1.676 m), weight 112.129 kg (247 lb 3.2 oz), last menstrual period 05/27/2015, SpO2 99 %.   Intake/Output Summary (Last 24 hours) at 08/13/15 1308 Last data filed at 08/13/15 1234  Gross per 24 hour  Intake    960 ml  Output    200 ml  Net    760 ml    Exam Awake Alert, Oriented x 3, No new F.N deficits, Normal affect [place].AT,PERRAL Supple Neck,No JVD, No cervical lymphadenopathy appriciated.  Symmetrical Chest wall movement, Good air movement bilaterally, CTAB RRR,No Gallops,Rubs or new Murmurs, No Parasternal Heave +ve B.Sounds, Abd Soft, Non tender, No organomegaly appriciated, No rebound -guarding or rigidity. No Cyanosis, Clubbing or edema, No new Rash or bruise  Data Review   CBC w Diff: Lab Results  Component Value Date   WBC 7.8 08/11/2015   HGB 10.7* 08/11/2015   HCT 32.0* 08/11/2015   PLT 344 08/11/2015   LYMPHOPCT 13 03/05/2015    MONOPCT 4 03/05/2015   EOSPCT 0 03/05/2015   BASOPCT 0 03/05/2015    CMP: Lab Results  Component Value Date   NA 136 08/11/2015   K 3.8 08/11/2015   CL 104 08/11/2015   CO2 26 08/11/2015   BUN 11 08/11/2015   CREATININE 0.91 08/11/2015   PROT 7.9 08/11/2015   ALBUMIN 3.4* 08/11/2015   BILITOT 0.7 08/11/2015  ALKPHOS 75 08/11/2015   AST 19 08/11/2015   ALT 12* 08/11/2015  .   Total Time in preparing paper work, data evaluation and todays exam - 35 minutes  [name] M.D on 08/13/2015 at [facility]   Office  [phone]

## 2015-08-13 NOTE — Progress Notes (Signed)
Discharge paperwork given to patient. No questions verbalized. IV removed. Patient given MD note of when to return to work as well as a prescription for metformin and HCTZ. Patient is getting dressed and ready to discharge

## 2015-08-14 LAB — URINE CULTURE

## 2015-10-30 ENCOUNTER — Encounter (HOSPITAL_COMMUNITY): Payer: Self-pay | Admitting: Student-PharmD

## 2017-07-22 ENCOUNTER — Emergency Department (HOSPITAL_COMMUNITY)
Admission: EM | Admit: 2017-07-22 | Discharge: 2017-07-22 | Disposition: A | Discharge status: Home / Self Care | Payer: Self-pay | Attending: Emergency Medicine | Admitting: Emergency Medicine

## 2017-07-22 ENCOUNTER — Other Ambulatory Visit: Payer: Self-pay

## 2017-07-22 ENCOUNTER — Encounter (HOSPITAL_COMMUNITY): Payer: Self-pay | Admitting: *Deleted

## 2017-07-22 DIAGNOSIS — R2241 Localized swelling, mass and lump, right lower limb: Secondary | ICD-10-CM | POA: Insufficient documentation

## 2017-07-22 DIAGNOSIS — Z5321 Procedure and treatment not carried out due to patient leaving prior to being seen by health care provider: Secondary | ICD-10-CM | POA: Insufficient documentation

## 2017-07-22 NOTE — ED Notes (Signed)
No reply for vitals x2

## 2017-07-22 NOTE — ED Notes (Signed)
No reply for vitals x3. 

## 2017-07-22 NOTE — ED Notes (Signed)
Vitals check, unanswered

## 2017-07-22 NOTE — ED Triage Notes (Signed)
Pt reports ongoing right foot swelling. Denies any pain or injury. Swelling decreases after rest and elevation. No acute distress is noted at triage.

## 2017-07-22 NOTE — ED Notes (Signed)
Pt stated she only has one chance to get her blood, writer attempted X1 unsuccessful.

## 2017-08-03 ENCOUNTER — Encounter (HOSPITAL_COMMUNITY): Payer: Self-pay | Admitting: Emergency Medicine

## 2017-08-03 ENCOUNTER — Emergency Department (HOSPITAL_COMMUNITY): Payer: Self-pay

## 2017-08-03 ENCOUNTER — Emergency Department (HOSPITAL_COMMUNITY)
Admission: EM | Admit: 2017-08-03 | Discharge: 2017-08-03 | Disposition: A | Discharge status: Home / Self Care | Payer: Self-pay | Attending: Emergency Medicine | Admitting: Emergency Medicine

## 2017-08-03 DIAGNOSIS — Z87891 Personal history of nicotine dependence: Secondary | ICD-10-CM | POA: Insufficient documentation

## 2017-08-03 DIAGNOSIS — M25571 Pain in right ankle and joints of right foot: Secondary | ICD-10-CM | POA: Insufficient documentation

## 2017-08-03 DIAGNOSIS — I10 Essential (primary) hypertension: Secondary | ICD-10-CM | POA: Insufficient documentation

## 2017-08-03 DIAGNOSIS — R2241 Localized swelling, mass and lump, right lower limb: Secondary | ICD-10-CM | POA: Insufficient documentation

## 2017-08-03 DIAGNOSIS — E119 Type 2 diabetes mellitus without complications: Secondary | ICD-10-CM | POA: Insufficient documentation

## 2017-08-03 DIAGNOSIS — Z794 Long term (current) use of insulin: Secondary | ICD-10-CM | POA: Insufficient documentation

## 2017-08-03 DIAGNOSIS — J45909 Unspecified asthma, uncomplicated: Secondary | ICD-10-CM | POA: Insufficient documentation

## 2017-08-03 DIAGNOSIS — Z79899 Other long term (current) drug therapy: Secondary | ICD-10-CM | POA: Insufficient documentation

## 2017-08-03 NOTE — ED Provider Notes (Signed)
[facility] Provider Note   CSN: [id number] Arrival date & time: 08/03/17  1506     History   Chief Complaint Chief Complaint  Patient presents with  . Ankle Injury    HPI [name] is a 52 y.o. female.  Pt comes in with c/o ankle and foot pain. She states that she sprained her ankle about  Months ago. She states that she is was never seen about the ankle but now when she works she gets swelling which resolves when she puts her foot up. Denies any pain. She states that sometime the base of her great toe is sore but has had no redness or swelling to the area     Past Medical History:  Diagnosis Date  . Asthma    Child  . Diabetes mellitus   . Obesity     Patient Active Problem List   Diagnosis Date Noted  . Intractable nausea and vomiting 08/12/2015  . 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 Type 2 diabetes mellitus, uncontrolled, with renal complications ([place]) 95/63/8756  . HTN (hypertension) 08/12/2015  . Dental cavities 05/10/2015  . Sepsis ([place]) 03/04/2015  . Hyponatremia 03/04/2015  . Hypokalemia 03/04/2015  . DKA (diabetic ketoacidoses) ([place]) 03/04/2015  . Diabetes mellitus with hyperglycemia, without long-term current use of insulin ([place]) 03/03/2015  . Pyelonephritis 03/03/2015  . AKI (acute kidney injury) ([place]) 03/03/2015  . Upper respiratory disease 03/08/2014  . Tooth abscess 03/08/2014  . Chest pain 06/25/2011    Past Surgical History:  Procedure Laterality Date  . CHOLECYSTECTOMY    . TUBAL LIGATION       OB History   None      Home Medications    Prior to Admission medications   Medication Sig Start Date End Date Taking? Authorizing Provider  lisinopril-hydrochlorothiazide (PRINZIDE,ZESTORETIC) 10-12.5 MG tablet Take 1 tablet by mouth daily. 08/13/15   Elgergawy, Silver Huguenin, MD  metFORMIN (GLUCOPHAGE) 850 MG tablet Take 1 tablet (850 mg total) by mouth 2 (two) times daily  with a meal. 08/13/15   Elgergawy, Silver Huguenin, MD    Family History Family History  Problem Relation Age of Onset  . Diabetes Mother     Social History Social History   Tobacco Use  . Smoking status: Former Smoker    Packs/day: 0.00  . Smokeless tobacco: Never Used  . Tobacco comment: Smoked when she was a teenager.   Substance Use Topics  . Alcohol use: Yes    Comment: occ  . Drug use: No     Allergies   Penicillins   Review of Systems Review of Systems  All other systems reviewed and are negative.    Physical Exam Updated Vital Signs BP 112/71 (BP Location: Right Arm)   Pulse 98   Temp 98.9 F (37.2 C) (Oral)   Resp 17   Ht 5\' 6"  (1.676 m)   Wt 104.3 kg (230 lb)   LMP 05/26/2017   SpO2 99%   BMI 37.12 kg/m   Physical Exam  Constitutional: She is oriented to person, place, and time. She appears well-developed and well-nourished.  Cardiovascular: Normal rate and regular rhythm.  Pulmonary/Chest: Effort normal and breath sounds normal.  Musculoskeletal: Normal range of motion.  Lateral swelling to the right ankle full rom. No swelling or redness noted to the foot  Neurological: She is alert and oriented to person, place, and time.  Skin: Skin is warm and dry.  Psychiatric:  She has a normal mood and affect.  Nursing note and vitals reviewed.    ED Treatments / Results  Labs (all labs ordered are listed, but only abnormal results are displayed) Labs Reviewed - No data to display  EKG None  Radiology Dg Ankle Complete Right  Result Date: 08/03/2017 CLINICAL DATA:  Right ankle twisting injury EXAM: RIGHT ANKLE - COMPLETE 3+ VIEW COMPARISON:  None. FINDINGS: Marked diffuse right ankle soft tissue swelling. No fracture. Pes planus deformity. Suggestion of dorsal subluxation of the navicular at the talonavicular articulation. Sclerosis and mild fragmentation in proximal tarsal joints. Large plantar right calcaneal spur. Venous phleboliths in the anterior  distal right lower extremity soft tissues. Vascular calcifications in the soft tissues. No radiopaque foreign body. IMPRESSION: 1. No fracture. 2. Marked diffuse right ankle soft tissue swelling. 3. Sclerosis and mild fragmentation in the proximal tarsal joints with suggestion of dorsal subluxation of the navicular at the talonavicular articulation. 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 4. Large right calcaneal spur. Electronically Signed   By: Ilona Sorrel M.D.   On: 08/03/2017 15:51    Procedures Procedures (including critical care time)  Medications Ordered in ED Medications - No data to display   Initial Impression / Assessment and Plan / ED Course  I have reviewed the triage vital signs and the nursing notes.  Pertinent labs & imaging results that were available during my care of the patient were reviewed by me and considered in my medical decision making (see chart for details).     Pt is a diabetic and states that she just started her metformin again  Final Clinical Impressions(s) / ED Diagnoses   Final diagnoses:  None    ED Discharge Orders    None       Glendell Docker, NP 08/03/17 1621    Carmin Muskrat, MD 08/03/17 1652

## 2017-08-03 NOTE — ED Triage Notes (Signed)
Pt reports about 6 months ago stepping out of a van and twisting her rt ankle. Pt reports she never followed up on it. Pt reports rt foot swelling after standing on it for too long but denies pain. Pt ambulatory.

## 2017-08-03 NOTE — ED Notes (Signed)
Back to room from xray

## 2017-08-03 NOTE — Discharge Instructions (Signed)
Get compression stocking as discussed

## 2017-11-03 ENCOUNTER — Ambulatory Visit: Payer: Self-pay | Attending: Family Medicine | Admitting: Family Medicine

## 2017-11-03 ENCOUNTER — Encounter: Payer: Self-pay | Admitting: Family Medicine

## 2017-11-03 DIAGNOSIS — Z794 Long term (current) use of insulin: Secondary | ICD-10-CM | POA: Insufficient documentation

## 2017-11-03 DIAGNOSIS — L03115 Cellulitis of right lower limb: Secondary | ICD-10-CM

## 2017-11-03 DIAGNOSIS — R600 Localized edema: Secondary | ICD-10-CM

## 2017-11-03 DIAGNOSIS — Z9049 Acquired absence of other specified parts of digestive tract: Secondary | ICD-10-CM | POA: Insufficient documentation

## 2017-11-03 DIAGNOSIS — Z9851 Tubal ligation status: Secondary | ICD-10-CM | POA: Insufficient documentation

## 2017-11-03 DIAGNOSIS — M25571 Pain in right ankle and joints of right foot: Secondary | ICD-10-CM | POA: Insufficient documentation

## 2017-11-03 DIAGNOSIS — Z87891 Personal history of nicotine dependence: Secondary | ICD-10-CM | POA: Insufficient documentation

## 2017-11-03 DIAGNOSIS — E1165 Type 2 diabetes mellitus with hyperglycemia: Secondary | ICD-10-CM

## 2017-11-03 DIAGNOSIS — I10 Essential (primary) hypertension: Secondary | ICD-10-CM

## 2017-11-03 DIAGNOSIS — M7989 Other specified soft tissue disorders: Secondary | ICD-10-CM | POA: Insufficient documentation

## 2017-11-03 LAB — POCT GLYCOSYLATED HEMOGLOBIN (HGB A1C)
HbA1c POC (<> result, manual entry): 11.7 %
HbA1c, POC (controlled diabetic range): 11.7 % — AB (ref 0.0–7.0)
HbA1c, POC (prediabetic range): 11.7 % — AB (ref 5.7–6.4)
Hemoglobin A1C: 11.7 % — AB (ref 4.0–5.6)

## 2017-11-03 LAB — GLUCOSE, POCT (MANUAL RESULT ENTRY): POC Glucose: 355 mg/dL — AB (ref 70–99)

## 2017-11-03 MED ORDER — METFORMIN HCL 850 MG PO TABS: 850.0000 mg | ORAL_TABLET | Freq: Two times a day (BID) | ORAL | 6 refills | Status: DC

## 2017-11-03 MED ORDER — GLIPIZIDE ER 10 MG PO TB24: 10.0000 mg | ORAL_TABLET | Freq: Every day | ORAL | 6 refills | Status: DC

## 2017-11-03 MED ORDER — INSULIN ASPART 100 UNIT/ML ~~LOC~~ SOLN
10.0000 [IU] | Freq: Once | SUBCUTANEOUS | Status: AC
  Administered 2017-11-03: 10 [IU] via SUBCUTANEOUS

## 2017-11-03 MED ORDER — FUROSEMIDE 20 MG PO TABS: 20.0000 mg | ORAL_TABLET | Freq: Every day | ORAL | 0 refills | Status: DC

## 2017-11-03 MED ORDER — TRUEPLUS LANCETS 28G MISC: 11 refills | Status: AC

## 2017-11-03 MED ORDER — DOXYCYCLINE HYCLATE 100 MG PO TABS: 100.0000 mg | ORAL_TABLET | Freq: Two times a day (BID) | ORAL | 0 refills | Status: DC

## 2017-11-03 MED ORDER — TRUE METRIX METER W/DEVICE KIT: PACK | 0 refills | Status: AC

## 2017-11-03 MED ORDER — GLUCOSE BLOOD VI STRP: ORAL_STRIP | 12 refills | Status: AC

## 2017-11-03 MED ORDER — LISINOPRIL-HYDROCHLOROTHIAZIDE 10-12.5 MG PO TABS: 1.0000 | ORAL_TABLET | Freq: Every day | ORAL | 6 refills | Status: DC

## 2017-11-03 MED FILL — TRUEplus LANCETS 28G MISC: 30 days supply | Qty: 100 | Fill #0

## 2017-11-03 MED FILL — FUROSEMIDE 20 MG TABLET: 20 | 30 days supply | Qty: 30 | Fill #0

## 2017-11-03 MED FILL — !TRUE METRIX BLOOD GLUCOSE: 365 days supply | Qty: 1 | Fill #0

## 2017-11-03 MED FILL — TRUE METRIX TEST STRIP: 30 days supply | Qty: 100 | Fill #0

## 2017-11-03 MED FILL — metFORMIN HCL 850 MG TABS: 850 | 30 days supply | Qty: 60 | Fill #0

## 2017-11-03 MED FILL — DOXYCYCLINE HYCLATE 100 MG: 100 | 10 days supply | Qty: 20 | Fill #0

## 2017-11-03 MED FILL — glipiZIDE XL 10 MG TB24: 10 | 30 days supply | Qty: 30 | Fill #0

## 2017-11-03 MED FILL — LISINOPRIL-HCTZ 10-12.5 MG: 10-12.5 | 30 days supply | Qty: 30 | Fill #0

## 2017-11-03 NOTE — Progress Notes (Signed)
Flu declined  Rt foot pain 2 months.  Metformin refill

## 2017-11-03 NOTE — Progress Notes (Signed)
Subjective:    Patient ID: [name], female    DOB: Jul 11, 1965, 52 y.o.   MRN: [id number]  HPI  52 yo female seen to re-establish care of her diabetes and patient with complaint of recurrent pain and swelling in her right foot.  Patient reports that she has been out of several medications including her medications for diabetes for up to 6 months.  Patient states that she has had onset since February of recurrent pain and swelling in her right foot.  Patient states that sometimes the pain is a dull ache but sometimes she has throbbing pain in the middle of her foot just below the ankle.  Patient states that the swelling will usually go down.  Patient states that she was having some increased thirst and urinary frequency when she was not on her diabetes medicine.  Patient states that she was on insulin in the past when she was initially diagnosed with diabetes the patient states that she does not wish to take insulin.  Patient feels that the metformin does control her blood sugars when she has the medication available to take.  Patient does not have a glucometer currently.  Patient is unsure how her blood sugars have been running.  Patient denies any headaches or dizziness related to her blood pressure but has also been out of blood pressure medication.  Patient does have some fatigue.  Patient denies any shortness of breath or cough. Past Medical History:  Diagnosis Date  . Asthma    Child  . Diabetes mellitus   . Obesity    Past Surgical History:  Procedure Laterality Date  . CHOLECYSTECTOMY    . TUBAL LIGATION     Family History  Problem Relation Age of Onset  . Diabetes Mother    Social History   Tobacco Use  . Smoking status: Former Smoker    Packs/day: 0.00  . Smokeless tobacco: Never Used  . Tobacco comment: Smoked when she was a teenager.   Substance Use Topics  . Alcohol use: Yes    Comment: occ  . Drug use: No   Allergies  Allergen Reactions  . Penicillins Rash      Review of Systems  Constitutional: Positive for fatigue. Negative for chills and fever.  Respiratory: Negative for cough and shortness of breath.   Cardiovascular: Positive for leg swelling. Negative for chest pain and palpitations.  Gastrointestinal: Negative for abdominal pain and nausea.  Genitourinary: Positive for frequency (Occasional). Negative for dysuria.  Musculoskeletal: Positive for arthralgias and joint swelling. Negative for back pain.  Neurological: Negative for dizziness and headaches.       Objective:   Physical Exam vital signs were not entered but were done and reviewed while patient was here in the clinic General-well-nourished overweight female in no acute distress.  Patient with visible edema of the right foot ENT-TMs gray, nares with mild edema of the nasal turbinates,, mild posterior pharynx erythema and mildly narrowed posterior pharynx airway secondary to body habitus and large tongue base Neck-supple, no lymphadenopathy, no thyromegaly, no carotid bruit Lungs-clear to auscultation bilaterally Cardiovascular-regular rate and rhythm Abdomen-soft, nontender Back-no CVA tenderness Extremities- patient with mild nonpitting distal left lower extremity edema but patient with marked, nonpitting right distal lower extremity edema and pedal edema.  Patient with hyperpigmentation/darkening of the skin over the right midfoot as well as increased warmth in this area.  Pedal edema is generalized.  Pulses are difficult to appreciate due to the edema but are slightly palpable.  Skin on the foot and toes does appear darker but no cyanosis      Assessment & Plan:  1. Uncontrolled type 2 diabetes mellitus with hyperglycemia [facility]) Patient reports that she has been out of her metformin for up to 6 months.  Patient's hemoglobin A1c at today's visit was elevated at 11.7 and patient with long-standing issues with poorly controlled diabetes as her last A1c 2 years ago was 12.9.   Patient has had inconsistent follow-up of her diabetes.  Patient does not wish to take insulin.  Patient is provided with refill of metformin but glipizide XL once daily was also added.  Patient's blood sugar here in the office at today's visit was elevated at 355 and patient was given 10 units of regular insulin.  Patient was also given prescription for glucometer, strips and lancets for 3 times daily checking of her blood sugar.  Patient with edema and increased warmth of the right foot and I discussed with patient that I am concerned about infection/osteomyelitis as patient states that she has had pain and swelling for some time now.  Patient is being placed on an antibiotic and is to obtain an x-ray of her foot and will be referred to podiatry.  Urine microalbumin was also ordered but apparently not obtained at today's visit. - Glucose (CBG) - HgB A1c - Comprehensive metabolic panel - insulin aspart (novoLOG) injection 10 Units - metFORMIN (GLUCOPHAGE) 850 MG tablet; Take 1 tablet (850 mg total) by mouth 2 (two) times daily with a meal.  Dispense: 60 tablet; Refill: 6 - glipiZIDE (GLIPIZIDE XL) 10 MG 24 hr tablet; Take 1 tablet (10 mg total) by mouth daily with breakfast. To lower blood sugar  Dispense: 30 tablet; Refill: 6 - Ambulatory referral to Podiatry - Blood Glucose Monitoring Suppl (TRUE METRIX METER) w/Device KIT; Use to check blood sugar 3 times daily  Dispense: 1 kit; Refill: 0 - glucose blood (TRUE METRIX BLOOD GLUCOSE TEST) test strip; Use to check blood sugar three times per day  Dispense: 100 each; Refill: 12 - TRUEPLUS LANCETS 28G MISC; Use to check blood sugar 3 times daily  Dispense: 100 each; Refill: 11  2. Essential hypertension Patient provided with new prescription for lisinopril HCTZ for control of blood pressure. - lisinopril-hydrochlorothiazide (PRINZIDE,ZESTORETIC) 10-12.5 MG tablet; Take 1 tablet by mouth daily.  Dispense: 30 tablet; Refill: 6  3. Edema of right  foot Patient with complaint of pain and swelling in the right foot.  Patient was given limited supply of Lasix to help with the swelling in her leg/foot but patient also may have infection/osteomyelitis.  Patient is to obtain a plain x-ray of her foot and in the interim will be started on doxycycline.  Patient is being referred to podiatry.  Patient was asked to follow-up in 1 week - furosemide (LASIX) 20 MG tablet; Take 1 tablet (20 mg total) by mouth daily. As needed to reduce swelling  Dispense: 30 tablet; Refill: 0 - DG Foot Complete Right; Future - Ambulatory referral to Podiatry  4. Cellulitis of right foot Patient with increased warmth and edema of the right foot but as patient has poorly controlled diabetes as well as complaint that this issue has been going on for an extended time, I am also concerned regarding possibility of osteomyelitis.  Patient is provided with order to obtain an x-ray today or tomorrow of her foot.  Patient is being placed on doxycycline as she is allergic to penicillin.  Patient is also being referred  to podiatry.  Patient has been placed on medicines to help control her blood sugar. - doxycycline (VIBRA-TABS) 100 MG tablet; Take 1 tablet (100 mg total) by mouth 2 (two) times daily.  Dispense: 20 tablet; Refill: 0 - Ambulatory referral to Podiatry  *Patient was offered but declined influenza immunization at today's visit  An After Visit Summary was printed and given to the patient.  Return in about 1 week (around 11/10/2017) for 1-2 week f/u of right foot/DM.

## 2017-11-04 LAB — COMPREHENSIVE METABOLIC PANEL WITH GFR
ALT: 8 IU/L (ref 0–32)
AST: 17 IU/L (ref 0–40)
Albumin/Globulin Ratio: 1 — ABNORMAL LOW (ref 1.2–2.2)
Albumin: 3.7 g/dL (ref 3.5–5.5)
Alkaline Phosphatase: 119 IU/L — ABNORMAL HIGH (ref 39–117)
BUN/Creatinine Ratio: 9 (ref 9–23)
BUN: 12 mg/dL (ref 6–24)
Bilirubin Total: <0.2 mg/dL (ref 0.0–1.2)
CO2: 24 mmol/L (ref 20–29)
Calcium: 9.1 mg/dL (ref 8.7–10.2)
Chloride: 100 mmol/L (ref 96–106)
Creatinine, Ser: 1.35 mg/dL — ABNORMAL HIGH (ref 0.57–1.00)
GFR calc Af Amer: 53 mL/min/1.73 — ABNORMAL LOW
GFR calc non Af Amer: 46 mL/min/1.73 — ABNORMAL LOW
Globulin, Total: 3.7 g/dL (ref 1.5–4.5)
Glucose: 325 mg/dL — ABNORMAL HIGH (ref 65–99)
Potassium: 4.6 mmol/L (ref 3.5–5.2)
Sodium: 138 mmol/L (ref 134–144)
Total Protein: 7.4 g/dL (ref 6.0–8.5)

## 2017-11-05 ENCOUNTER — Other Ambulatory Visit: Payer: Self-pay | Admitting: Family Medicine

## 2017-11-05 ENCOUNTER — Telehealth: Payer: Self-pay

## 2017-11-05 ENCOUNTER — Telehealth: Payer: Self-pay | Admitting: Family Medicine

## 2017-11-05 ENCOUNTER — Ambulatory Visit (HOSPITAL_COMMUNITY)
Admission: RE | Admit: 2017-11-05 | Discharge: 2017-11-05 | Disposition: A | Discharge status: Home / Self Care | Payer: Self-pay | Source: Ambulatory Visit | Attending: Family Medicine | Admitting: Family Medicine

## 2017-11-05 DIAGNOSIS — S93331A Other subluxation of right foot, initial encounter: Secondary | ICD-10-CM | POA: Insufficient documentation

## 2017-11-05 DIAGNOSIS — R600 Localized edema: Secondary | ICD-10-CM | POA: Insufficient documentation

## 2017-11-05 DIAGNOSIS — R9389 Abnormal findings on diagnostic imaging of other specified body structures: Secondary | ICD-10-CM

## 2017-11-05 DIAGNOSIS — X58XXXA Exposure to other specified factors, initial encounter: Secondary | ICD-10-CM | POA: Insufficient documentation

## 2017-11-05 DIAGNOSIS — M79671 Pain in right foot: Secondary | ICD-10-CM

## 2017-11-05 NOTE — Telephone Encounter (Signed)
Patient was called, verified DOB, and was given most recent xray results. Patient verbalized understanding and had no further questions.

## 2017-11-05 NOTE — Telephone Encounter (Signed)
-----   Message from [name], MD sent at 11/04/2017 11:01 AM EDT ----- Please notify patient that her CMP showed that her glucose was elevated at 325.  Patient also had elevation in her creatinine which is a measurement of kidney function at 1.35.  It is very important that she control her blood sugars and blood pressure.  Please also ask if patient was able to obtain and start her antibiotic

## 2017-11-05 NOTE — Telephone Encounter (Signed)
Patient was called, answered, verified dob. Patient was informed and verbalized understanding. Patient had no further questions, although patient stated that she was unable to get her antibiotic due to funds. She stated that she gets paid Friday 11/07/17 and will pick it up then.

## 2017-11-05 NOTE — Telephone Encounter (Signed)
MA spoke with radiology who will contact the patient with a STAT appointment for today or tomorrow.

## 2017-11-05 NOTE — Telephone Encounter (Signed)
-----   Message from [name], MD sent at 11/05/2017  3:30 PM EDT ----- Please notify patient of abnormal appearance to the bones in the right foot on x-ray and MRI is recommended to evaluate further for presence of osteomyelitis/infection in the bone

## 2017-11-05 NOTE — Telephone Encounter (Signed)
Opal Sidles called from radiology stating she has urgent reports to give to the patients nurse, please follow up asap.

## 2017-11-07 ENCOUNTER — Other Ambulatory Visit: Payer: Self-pay | Admitting: *Deleted

## 2017-11-14 ENCOUNTER — Ambulatory Visit (HOSPITAL_COMMUNITY): Payer: Self-pay | Attending: Family Medicine

## 2017-11-20 ENCOUNTER — Ambulatory Visit: Payer: Self-pay | Admitting: Family Medicine

## 2017-11-24 ENCOUNTER — Ambulatory Visit (INDEPENDENT_AMBULATORY_CARE_PROVIDER_SITE_OTHER): Payer: Self-pay | Admitting: Orthopedic Surgery

## 2017-12-03 ENCOUNTER — Ambulatory Visit: Payer: Self-pay | Attending: Family Medicine | Admitting: Physician Assistant

## 2017-12-03 ENCOUNTER — Other Ambulatory Visit: Payer: Self-pay

## 2017-12-03 VITALS — BP 132/84 | HR 82 | Temp 98.5°F | Resp 18 | Ht 66.0 in | Wt 250.4 lb

## 2017-12-03 DIAGNOSIS — E669 Obesity, unspecified: Secondary | ICD-10-CM | POA: Insufficient documentation

## 2017-12-03 DIAGNOSIS — Z79899 Other long term (current) drug therapy: Secondary | ICD-10-CM | POA: Insufficient documentation

## 2017-12-03 DIAGNOSIS — R600 Localized edema: Secondary | ICD-10-CM | POA: Insufficient documentation

## 2017-12-03 DIAGNOSIS — Z6841 Body Mass Index (BMI) 40.0 and over, adult: Secondary | ICD-10-CM | POA: Insufficient documentation

## 2017-12-03 DIAGNOSIS — Z880 Allergy status to penicillin: Secondary | ICD-10-CM | POA: Insufficient documentation

## 2017-12-03 DIAGNOSIS — E1165 Type 2 diabetes mellitus with hyperglycemia: Secondary | ICD-10-CM | POA: Insufficient documentation

## 2017-12-03 DIAGNOSIS — Z7984 Long term (current) use of oral hypoglycemic drugs: Secondary | ICD-10-CM | POA: Insufficient documentation

## 2017-12-03 LAB — GLUCOSE, POCT (MANUAL RESULT ENTRY): POC Glucose: 77 mg/dl (ref 70–99)

## 2017-12-03 MED ORDER — FUROSEMIDE 20 MG PO TABS: ORAL_TABLET | ORAL | 2 refills | Status: DC

## 2017-12-03 MED FILL — FUROSEMIDE 20 MG TABLET: 20 | 30 days supply | Qty: 30 | Fill #0

## 2017-12-03 NOTE — Progress Notes (Signed)
Requested refill for Lasix, doxycycline [facility] pharmacy

## 2017-12-03 NOTE — Progress Notes (Signed)
Patient ID: [name], female   DOB: Jan 23, 1966, 52 y.o.   MRN: [id number]     [name], is a 52 y.o. female  NIO:[id number]  GHW:[id number]  DOB - 1965-06-11  Subjective:  Chief Complaint and HPI: [name] is a 52 y.o. female here today still with on and off swelling of RLE.  No fever/chills/no redness.  Finished antibiotics and seemed better.  Out of lasix which was helping on days she working.  She works 12 hour shifts.  Denies SOB/CP.    ROS:   Constitutional:  No f/c, No night sweats, No unexplained weight loss. EENT:  No vision changes, No blurry vision, No hearing changes. No mouth, throat, or ear problems.  Respiratory: No cough, No SOB Cardiac: No CP, no palpitations GI:  No abd pain, No N/V/D. GU: No Urinary s/sx Musculoskeletal: No joint pain Neuro: No headache, no dizziness, no motor weakness.  Skin: No rash Endocrine:  No polydipsia. No polyuria.  Psych: Denies SI/HI  No problems updated.  ALLERGIES: Allergies  Allergen Reactions  . Penicillins Rash    PAST MEDICAL HISTORY: Past Medical History:  Diagnosis Date  . Asthma    Child  . Diabetes mellitus   . Obesity     MEDICATIONS AT HOME: Prior to Admission medications   Medication Sig Start Date End Date Taking? Authorizing Provider  Blood Glucose Monitoring Suppl (TRUE METRIX METER) w/Device KIT Use to check blood sugar 3 times daily 11/03/17  Yes [name], [name], MD  furosemide (LASIX) 20 MG tablet As needed 1 daily prn for leg swelling 12/03/17  Yes Sagal Gayton M, [name]  glipiZIDE (GLIPIZIDE XL) 10 MG 24 hr tablet Take 1 tablet (10 mg total) by mouth daily with breakfast. To lower blood sugar 11/03/17  Yes [name], [name], MD  glucose blood (TRUE METRIX BLOOD GLUCOSE TEST) test strip Use to check blood sugar three times per day 11/03/17  Yes [name], [name], MD  lisinopril-hydrochlorothiazide (PRINZIDE,ZESTORETIC) 10-12.5 MG tablet Take 1 tablet by mouth daily. 11/03/17  Yes [name], [name], MD    metFORMIN (GLUCOPHAGE) 850 MG tablet Take 1 tablet (850 mg total) by mouth 2 (two) times daily with a meal. 11/03/17  Yes [name], [name], MD  TRUEPLUS LANCETS 28G MISC Use to check blood sugar 3 times daily 11/03/17  Yes [name], [name], MD     Objective:  EXAM:   Vitals:   12/03/17 1002  BP: 132/84  Pulse: 82  Resp: 18  Temp: 98.5 F (36.9 C)  TempSrc: Oral  SpO2: 100%  Weight: 250 lb 6.4 oz (113.6 kg)  Height: '5\' 6"'  (1.676 m)    General appearance : A&OX3. NAD. Non-toxic-appearing HEENT: Atraumatic and Normocephalic.  PERRLA. EOM intact.  Neck: supple, no JVD. No cervical lymphadenopathy. No thyromegaly Chest/Lungs:  Breathing-non-labored, Good air entry bilaterally, breath sounds normal without rales, rhonchi, or wheezing  CVS: S1 S2 regular, no murmurs, gallops, rubs  Extremities: RLE with 1+ edema, LLE with minimal edema,   both legs are warm to touch with = pulse throughout.  No sign of infection Neurology:  CN II-XII grossly intact, Non focal.   Psych:  TP linear. J/I WNL. Normal speech. Appropriate eye contact and affect.  Skin:  No Rash  Data Review Lab Results  Component Value Date   HGBA1C 11.7 (A) 11/03/2017   HGBA1C 11.7 11/03/2017   HGBA1C 11.7 (A) 11/03/2017   HGBA1C 11.7 (A) 11/03/2017     Assessment & Plan   1. Edema of right foot  Compression stockings, educational info - furosemide (LASIX) 20 MG tablet; As needed 1 daily prn for leg swelling  Dispense: 30 tablet; Refill: 2  2. Uncontrolled type 2 diabetes mellitus with hyperglycemia (HCC) Controlled today - Glucose (CBG)     Patient have been counseled extensively about nutrition and exercise  Return for keep 10/17 appt with Dr Chapman Fitch.  The patient was given clear instructions to go to ER or return to medical center if symptoms don't improve, worsen or new problems develop. The patient verbalized understanding. The patient was told to call to get lab results if they haven't heard anything in the next  week.     Freeman Caldron, [name] [facility] and [facility] [place], [place]   12/03/2017, 10:14 AM

## 2017-12-03 NOTE — Patient Instructions (Signed)
Get compression stockings to wear to work or when you will be standing on your feet for long periods of time  Edema Edema is when you have too much fluid in your body or under your skin. Edema may make your legs, feet, and ankles swell up. Swelling is also common in looser tissues, like around your eyes. This is a common condition. It gets more common as you get older. There are many possible causes of edema. Eating too much salt (sodium) and being on your feet or sitting for a long time can cause edema in your legs, feet, and ankles. Hot weather may make edema worse. Edema is usually painless. Your skin may look swollen or shiny. Follow these instructions at home:  Keep the swollen body part raised (elevated) above the level of your heart when you are sitting or lying down.  Do not sit still or stand for a long time.  Do not wear tight clothes. Do not wear garters on your upper legs.  Exercise your legs. This can help the swelling go down.  Wear elastic bandages or support stockings as told by your doctor.  Eat a low-salt (low-sodium) diet to reduce fluid as told by your doctor.  Depending on the cause of your swelling, you may need to limit how much fluid you drink (fluid restriction).  Take over-the-counter and prescription medicines only as told by your doctor. Contact a doctor if:  Treatment is not working.  You have heart, liver, or kidney disease and have symptoms of edema.  You have sudden and unexplained weight gain. Get help right away if:  You have shortness of breath or chest pain.  You cannot breathe when you lie down.  You have pain, redness, or warmth in the swollen areas.  You have heart, liver, or kidney disease and get edema all of a sudden.  You have a fever and your symptoms get worse all of a sudden. Summary  Edema is when you have too much fluid in your body or under your skin.  Edema may make your legs, feet, and ankles swell up. Swelling is also  common in looser tissues, like around your eyes.  Raise (elevate) the swollen body part above the level of your heart when you are sitting or lying down.  Follow your doctor's instructions about diet and how much fluid you can drink (fluid restriction). This information is not intended to replace advice given to you by your health care provider. Make sure you discuss any questions you have with your health care provider. Document Released: 07/31/2007 Document Revised: 03/01/2016 Document Reviewed: 03/01/2016 Elsevier Interactive Patient Education  2017 [employer].

## 2017-12-09 ENCOUNTER — Ambulatory Visit (INDEPENDENT_AMBULATORY_CARE_PROVIDER_SITE_OTHER): Payer: Self-pay | Admitting: Orthopedic Surgery

## 2017-12-10 ENCOUNTER — Ambulatory Visit (HOSPITAL_COMMUNITY): Admission: RE | Admit: 2017-12-10 | Payer: Self-pay | Source: Ambulatory Visit

## 2017-12-11 ENCOUNTER — Other Ambulatory Visit: Payer: Self-pay

## 2017-12-11 ENCOUNTER — Ambulatory Visit: Payer: Self-pay | Attending: Family Medicine | Admitting: Family Medicine

## 2017-12-11 ENCOUNTER — Encounter: Payer: Self-pay | Admitting: Family Medicine

## 2017-12-11 VITALS — BP 151/80 | HR 87 | Temp 98.8°F | Resp 18 | Wt 258.4 lb

## 2017-12-11 DIAGNOSIS — E1165 Type 2 diabetes mellitus with hyperglycemia: Secondary | ICD-10-CM

## 2017-12-11 DIAGNOSIS — I10 Essential (primary) hypertension: Secondary | ICD-10-CM

## 2017-12-11 DIAGNOSIS — R600 Localized edema: Secondary | ICD-10-CM

## 2017-12-11 DIAGNOSIS — M7989 Other specified soft tissue disorders: Secondary | ICD-10-CM | POA: Insufficient documentation

## 2017-12-11 LAB — GLUCOSE, POCT (MANUAL RESULT ENTRY): POC Glucose: 94 mg/dL (ref 70–99)

## 2017-12-11 MED ORDER — LISINOPRIL-HYDROCHLOROTHIAZIDE 10-12.5 MG PO TABS: 2.0000 | ORAL_TABLET | Freq: Every day | ORAL | 6 refills | Status: DC

## 2017-12-11 NOTE — Progress Notes (Signed)
Subjective:    Patient ID: [name], female    DOB: 24-Oct-1965, 52 y.o.   MRN: [id number]  HPI 52 yo female seen in follow-up of uncontrolled diabetes as patient's hemoglobin A1c was elevated at 11.7.  Patient reports that her blood sugars are now better since she has restarted her medications.  Patient states that she was recently seen here by another provider and was told that the infection in her foot had resolved.  Patient states that she still has some swelling in her right foot but that the pain and redness have gone away.  Patient states that she is scheduled to have MRI done this Saturday.  Patient did not keep follow-up appointments that were made with orthopedics regarding her foot.  Patient reports that she has been taking Lasix which has improved the swelling in her right leg and foot.  Patient denies any muscle cramping associated with the use of this medication.    Review of Systems  Constitutional: Positive for fatigue. Negative for chills, diaphoresis and fever.  Respiratory: Negative for cough and shortness of breath.   Cardiovascular: Positive for leg swelling (improved with the use of lasix). Negative for chest pain and palpitations.  Gastrointestinal: Negative for abdominal pain and nausea.  Endocrine: Negative for polydipsia, polyphagia and polyuria.  Genitourinary: Negative for dysuria and frequency.  Musculoskeletal: Positive for joint swelling (improved). Negative for back pain and gait problem.  Neurological: Negative for dizziness, light-headedness, numbness and headaches.       Objective:   Physical Exam BP (!) 151/80   Pulse 87   Temp 98.8 F (37.1 C) (Oral)   Resp 18   Wt 258 lb 6.4 oz (117.2 kg)   SpO2 98%   BMI 41.71 kg/m   Vital signs and nurse's notes reviewed General-well-nourished, well-developed overweight older female in no acute distress Neck-supple, no lymphadenopathy, no JVD Lungs-clear to auscultation  bilaterally Cardiovascular-regular rate and rhythm Back-no CVA tenderness Extremities- patient with nonpitting edema bilateral right greater than left in the distal lower extremities as well as right pedal edema Musculoskeletal- patient with right pedal edema but patient denies any discomfort with palpation of the right foot/ankle area       Assessment & Plan:  1. Uncontrolled type 2 diabetes mellitus with hyperglycemia (HCC) Patient's blood sugar has now improved with a glucose of 94 at today's visit.  Patient will continue her current medications as well as dietary changes and continue to monitor her blood sugars. - Glucose (CBG)  2. Essential hypertension Patient's blood pressure was elevated at today's visit and patient's dose of lisinopril hydrochlorothiazide will be increased from 1 pill daily to 2 pills daily to help improve control of blood pressure. - lisinopril-hydrochlorothiazide (PRINZIDE,ZESTORETIC) 10-12.5 MG tablet; Take 2 tablets by mouth daily. To lower blood pressure  Dispense: 60 tablet; Refill: 6  3. Edema of right foot Patient was encouraged to keep her follow-up appointment for her MRI this weekend.  Patient had a recent abnormal x-ray of the right foot on 11/05/2017 showing extensive lucency and cortical loss in the midfoot with tarsonavicular subluxation which could represent chronic trauma although midfoot infection could also have the same appearance.  MRI was recommended for evaluation of osteomyelitis.  This was discussed with the patient regarding the need to have the MRI done in follow-up.  Patient was also encouraged to reschedule follow-up with orthopedics.  An After Visit Summary was printed and given to the patient. Return in about 2 months (around 02/10/2018) for  DM/HTN.

## 2017-12-11 NOTE — Patient Instructions (Signed)
Please keep the appointment for your MRI and you will be contacted with the results and if further follow-up is needed

## 2017-12-11 NOTE — Progress Notes (Signed)
  Pain:  0  Rescheduled mri to Saturday @ 9   Refill on linsinopril [place]

## 2017-12-13 ENCOUNTER — Ambulatory Visit (HOSPITAL_COMMUNITY): Admission: RE | Admit: 2017-12-13 | Payer: Self-pay | Source: Ambulatory Visit

## 2017-12-19 MED FILL — LISINOPRIL-HCTZ 10-12.5 MG: 10-12.5 | 30 days supply | Qty: 30 | Fill #1

## 2018-02-11 ENCOUNTER — Ambulatory Visit: Payer: Self-pay | Admitting: Family Medicine

## 2018-03-11 ENCOUNTER — Other Ambulatory Visit: Payer: Self-pay | Admitting: Family Medicine

## 2018-03-11 ENCOUNTER — Ambulatory Visit (HOSPITAL_COMMUNITY)
Admission: RE | Admit: 2018-03-11 | Discharge: 2018-03-11 | Disposition: A | Discharge status: Home / Self Care | Payer: Self-pay | Source: Ambulatory Visit | Attending: Family Medicine | Admitting: Family Medicine

## 2018-03-11 DIAGNOSIS — R9389 Abnormal findings on diagnostic imaging of other specified body structures: Secondary | ICD-10-CM | POA: Insufficient documentation

## 2018-03-11 DIAGNOSIS — M79671 Pain in right foot: Secondary | ICD-10-CM | POA: Insufficient documentation

## 2018-03-11 DIAGNOSIS — M84374S Stress fracture, right foot, sequela: Secondary | ICD-10-CM

## 2018-03-11 DIAGNOSIS — E11610 Type 2 diabetes mellitus with diabetic neuropathic arthropathy: Secondary | ICD-10-CM

## 2018-03-11 LAB — CREATININE, SERUM
Creatinine, Ser: 1.31 mg/dL — ABNORMAL HIGH (ref 0.44–1.00)
GFR calc Af Amer: 55 mL/min — ABNORMAL LOW
GFR calc non Af Amer: 47 mL/min — ABNORMAL LOW

## 2018-03-11 NOTE — Progress Notes (Signed)
Patient ID: [name], female   DOB: Jul 25, 1965, 53 y.o.   MRN: [id number]   Patient had MRI done today that was initially ordered back in October.  Patient does have extensive abnormalities on MRI of the right foot.  Patient will be contacted by CMA with the results and will also be asked to see if an appointment can be made for patient to be seen in clinic this week as well as notification that stat referral is being placed to orthopedics in follow-up of her abnormal right foot MRI.

## 2018-03-18 ENCOUNTER — Other Ambulatory Visit: Payer: Self-pay | Admitting: Family Medicine

## 2018-03-18 ENCOUNTER — Telehealth: Payer: Self-pay | Admitting: *Deleted

## 2018-03-18 DIAGNOSIS — M84374S Stress fracture, right foot, sequela: Secondary | ICD-10-CM

## 2018-03-18 DIAGNOSIS — M79671 Pain in right foot: Secondary | ICD-10-CM

## 2018-03-18 DIAGNOSIS — E11610 Type 2 diabetes mellitus with diabetic neuropathic arthropathy: Secondary | ICD-10-CM

## 2018-03-18 DIAGNOSIS — E1165 Type 2 diabetes mellitus with hyperglycemia: Secondary | ICD-10-CM

## 2018-03-18 NOTE — Telephone Encounter (Signed)
Patient stopped seeing Dr. Sharol Given because she told them she was seeing her PCP and did not need to be seen in their office. MA is able to see the new referral but no location was selected so it may be sitting in Epic limbo. MA forwarded STAT appointment need to front office manager.

## 2018-03-18 NOTE — Progress Notes (Signed)
Patient ID: [name], female   DOB: March 29, 1965, 53 y.o.   MRN: [id number]   Patient with recent abnormal MRI of the right foot (test was ordered last fall but patient only recently had this test done). CMA contacted patient to see if patient was being followed by Orthopedics but apparently patient told Ortho in the past that she did not need to follow-up with them. New STAT referral will be made for patient to be seen by Orthopedics in follow-up of her abnormal MRI of the right foot.

## 2018-03-18 NOTE — Telephone Encounter (Signed)
-----   Message from [name], MD sent at 03/11/2018  3:31 PM EST ----- Please ask patient is she is currently being seen by Orthopedics or Podiatry. This MRI was ordered back in Oct 2019. Per the MRI report there is extensive bony destructionand fragmentation in the right foot along with a stress fracture at the base of the 4th metatarsal and some tendon issues. I will be placing a STAT Orthopedic referral and please see if patient can see someone in office in the next 2 days as she also has some soft tissue swelling and needs an exam to look for any presence of an ulcer/infection

## 2018-03-18 NOTE — Telephone Encounter (Signed)
Called pt and to schedule an appt for her foot. Patient requested an appt for next Wednesday. Patient was scheduled with Freeman Caldron.

## 2018-03-25 ENCOUNTER — Ambulatory Visit: Payer: Self-pay

## 2018-03-25 NOTE — Telephone Encounter (Signed)
Patient does not have any insurance and has not completed the [facility] discount. Referral specialist mailed the application to the patient with instructions.

## 2018-03-27 NOTE — Telephone Encounter (Signed)
Patient has to complete [facility] financial prior to being scheduled. MA sent PCP the update from referral coordinator. Referral process will continue once patient completes her financials.

## 2019-04-24 ENCOUNTER — Ambulatory Visit: Payer: Self-pay | Attending: Internal Medicine

## 2019-04-24 DIAGNOSIS — Z23 Encounter for immunization: Secondary | ICD-10-CM | POA: Insufficient documentation

## 2019-04-24 NOTE — Progress Notes (Signed)
   Covid-19 Vaccination Clinic  Name:  Angellina Rupnow    MRN: [id number] DOB: 07/07/1965  04/24/2019  Ms. Banko was observed post Covid-19 immunization for 15 minutes without incidence. She was provided with Vaccine Information Sheet and instruction to access the V-Safe system.   Ms. Thackeray was instructed to call 911 with any severe reactions post vaccine: Marland Kitchen Difficulty breathing  . Swelling of your face and throat  . A fast heartbeat  . A bad rash all over your body  . Dizziness and weakness    Immunizations Administered    Name Date Dose VIS Date Route   Pfizer COVID-19 Vaccine 04/24/2019  6:12 PM 0.3 mL 02/05/2019 Intramuscular   Manufacturer: [place]   Lot: [id number]   [place]: [id number]

## 2019-05-15 ENCOUNTER — Ambulatory Visit: Payer: Self-pay | Attending: Internal Medicine

## 2019-05-15 DIAGNOSIS — Z23 Encounter for immunization: Secondary | ICD-10-CM

## 2019-05-15 NOTE — Progress Notes (Signed)
   Covid-19 Vaccination Clinic  Name:  Kilea Mccarey    MRN: [id number] DOB: 02/23/1966  05/15/2019  Ms. Berringer was observed post Covid-19 immunization for 15 minutes without incident. She was provided with Vaccine Information Sheet and instruction to access the V-Safe system.   Ms. Eichholz was instructed to call 911 with any severe reactions post vaccine: Marland Kitchen Difficulty breathing  . Swelling of face and throat  . A fast heartbeat  . A bad rash all over body  . Dizziness and weakness   Immunizations Administered    Name Date Dose VIS Date Route   Pfizer COVID-19 Vaccine 05/15/2019 11:03 AM 0.3 mL 02/05/2019 Intramuscular   Manufacturer: [place]   Lot: [id number]   [place]: [id number]

## 2020-02-06 ENCOUNTER — Encounter (HOSPITAL_COMMUNITY): Payer: Self-pay | Admitting: Emergency Medicine

## 2020-02-06 ENCOUNTER — Ambulatory Visit (HOSPITAL_COMMUNITY)
Admission: EM | Admit: 2020-02-06 | Discharge: 2020-02-06 | Disposition: A | Discharge status: Home / Self Care | Payer: Self-pay | Attending: Family Medicine | Admitting: Family Medicine

## 2020-02-06 ENCOUNTER — Other Ambulatory Visit: Payer: Self-pay

## 2020-02-06 ENCOUNTER — Emergency Department (HOSPITAL_COMMUNITY)
Admission: EM | Admit: 2020-02-06 | Discharge: 2020-02-06 | Disposition: A | Discharge status: Home / Self Care | Payer: Self-pay | Attending: Emergency Medicine | Admitting: Emergency Medicine

## 2020-02-06 ENCOUNTER — Encounter (HOSPITAL_COMMUNITY): Payer: Self-pay | Admitting: *Deleted

## 2020-02-06 DIAGNOSIS — K047 Periapical abscess without sinus: Secondary | ICD-10-CM

## 2020-02-06 DIAGNOSIS — Z5321 Procedure and treatment not carried out due to patient leaving prior to being seen by health care provider: Secondary | ICD-10-CM | POA: Insufficient documentation

## 2020-02-06 DIAGNOSIS — K0889 Other specified disorders of teeth and supporting structures: Secondary | ICD-10-CM

## 2020-02-06 MED ORDER — CLINDAMYCIN HCL 150 MG PO CAPS: 300.0000 mg | ORAL_CAPSULE | Freq: Three times a day (TID) | ORAL | 0 refills | Status: AC

## 2020-02-06 MED ORDER — HYDROCODONE-ACETAMINOPHEN 5-325 MG PO TABS: 1.0000 | ORAL_TABLET | Freq: Four times a day (QID) | ORAL | 0 refills | Status: DC | PRN

## 2020-02-06 MED ORDER — LIDOCAINE HCL (PF) 1 % IJ SOLN
INTRAMUSCULAR | Status: AC
  Filled 2020-02-06: qty 2

## 2020-02-06 MED ORDER — CEFTRIAXONE SODIUM 1 G IJ SOLR
INTRAMUSCULAR | Status: AC
  Filled 2020-02-06: qty 10

## 2020-02-06 MED ORDER — KETOROLAC TROMETHAMINE 30 MG/ML IJ SOLN
INTRAMUSCULAR | Status: AC
  Filled 2020-02-06: qty 1

## 2020-02-06 MED ORDER — CEFTRIAXONE SODIUM 1 G IJ SOLR
1.0000 g | Freq: Once | INTRAMUSCULAR | Status: AC
  Administered 2020-02-06: 1 g via INTRAMUSCULAR

## 2020-02-06 MED ORDER — IBUPROFEN 600 MG PO TABS: 600.0000 mg | ORAL_TABLET | Freq: Three times a day (TID) | ORAL | 0 refills | Status: DC | PRN

## 2020-02-06 MED ORDER — KETOROLAC TROMETHAMINE 30 MG/ML IJ SOLN
30.0000 mg | Freq: Once | INTRAMUSCULAR | Status: AC
  Administered 2020-02-06: 30 mg via INTRAMUSCULAR

## 2020-02-06 NOTE — ED Triage Notes (Signed)
C/O right lower toothache x 3 days without fevers.  C/O HA and right facial swelling.

## 2020-02-06 NOTE — ED Notes (Signed)
Pt stated she was leaving and going to urgent care

## 2020-02-06 NOTE — Discharge Instructions (Addendum)
Treating you for a dental infection.  Take the medication as prescribed. You can take 600 mg ibuprofen every 8 hours for pain and hydrocodone for severe pain as needed. I have given some information in your discharge instructions for follow-up for dental

## 2020-02-06 NOTE — ED Provider Notes (Signed)
[place]    CSN: [id number] Arrival date & time: 02/06/20  1605      History   Chief Complaint Chief Complaint  Patient presents with   Dental Pain    HPI [name] is a 54 y.o. female.   Patient is a 54 year old female presents today with dental pain, facial swelling.  This is been present worsening over the past 3 days.  No associated fevers, chills, nausea, vomiting, trismus.     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 ([place]) 08/12/2015   Type 2 diabetes mellitus, uncontrolled, with renal complications ([place]) 67/34/1937   HTN (hypertension) 08/12/2015   Dental cavities 05/10/2015   Sepsis ([place]) 03/04/2015   Hyponatremia 03/04/2015   Hypokalemia 03/04/2015   DKA (diabetic ketoacidoses) 03/04/2015   Diabetes mellitus with hyperglycemia, without long-term current use of insulin ([place]) 03/03/2015   Pyelonephritis 03/03/2015   AKI (acute kidney injury) ([place]) 03/03/2015   Upper respiratory disease 03/08/2014   Tooth abscess 03/08/2014   Chest pain 06/25/2011    Past Surgical History:  Procedure Laterality Date   CHOLECYSTECTOMY     TUBAL LIGATION      OB History   No obstetric history on file.      Home Medications    Prior to Admission medications   Medication Sig Start Date End Date Taking? Authorizing Provider  furosemide (LASIX) 20 MG tablet As needed 1 daily prn for leg swelling 12/03/17  Yes Freeman Caldron M, [name]  glipiZIDE (GLIPIZIDE XL) 10 MG 24 hr tablet Take 1 tablet (10 mg total) by mouth daily with breakfast. To lower blood sugar 11/03/17  Yes [name], [name], MD  lisinopril-hydrochlorothiazide (PRINZIDE,ZESTORETIC) 10-12.5 MG tablet Take 2 tablets by mouth daily. To lower blood pressure 12/11/17  Yes [name], [name],  MD  metFORMIN (GLUCOPHAGE) 850 MG tablet Take 1 tablet (850 mg total) by mouth 2 (two) times daily with a meal. 11/03/17  Yes [name], [name], MD  Blood Glucose Monitoring Suppl (TRUE METRIX METER) w/Device KIT Use to check blood sugar 3 times daily 11/03/17   [name], [name], MD  clindamycin (CLEOCIN) 150 MG capsule Take 2 capsules (300 mg total) by mouth 3 (three) times daily for 7 days. 02/06/20 02/13/20  [name], NP  glucose blood (TRUE METRIX BLOOD GLUCOSE TEST) test strip Use to check blood sugar three times per day 11/03/17   [name], [name], MD  HYDROcodone-acetaminophen (NORCO/VICODIN) 5-325 MG tablet Take 1-2 tablets by mouth every 6 (six) hours as needed. 02/06/20   [name], NP  ibuprofen (ADVIL) 600 MG tablet Take 1 tablet (600 mg total) by mouth every 8 (eight) hours as needed for moderate pain. 02/06/20   Orvan July, NP  TRUEPLUS LANCETS 28G MISC Use to check blood sugar 3 times daily 11/03/17   [name], MD    Family History Family History  Problem Relation Age of Onset   Diabetes Mother     Social History Social History   Tobacco Use   Smoking status: Former Smoker    Packs/day: 0.00   Smokeless tobacco: Never Used   Tobacco comment: Smoked when she was a teenager.   Vaping Use   Vaping Use: Never used  Substance Use Topics  Alcohol use: Yes    Comment: occasionally   Drug use: No     Allergies   Penicillins   Review of Systems Review of Systems   Physical Exam Triage Vital Signs ED Triage Vitals  Enc Vitals Group     BP 02/06/20 1629 (!) 158/89     Pulse Rate 02/06/20 1629 87     Resp 02/06/20 1629 16     Temp 02/06/20 1629 97.8 F (36.6 C)     Temp Source 02/06/20 1629 Oral     SpO2 02/06/20 1629 100 %     Weight --      Height --      Head Circumference --      Peak Flow --      Pain Score 02/06/20 1628 10     Pain Loc --      Pain Edu? --      Excl. in [place]? --    No data found.  Updated Vital Signs BP (!) 158/89    Pulse 87     Temp 97.8 F (36.6 C) (Oral)    Resp 16    LMP 05/26/2017    SpO2 100%   Visual Acuity Right Eye Distance:   Left Eye Distance:   Bilateral Distance:    Right Eye Near:   Left Eye Near:    Bilateral Near:     Physical Exam Vitals and nursing note reviewed.  Constitutional:      General: She is not in acute distress.    Appearance: Normal appearance. She is not ill-appearing, toxic-appearing or diaphoretic.  HENT:     Head: Normocephalic.     Nose: Nose normal.     Mouth/Throat:     Dentition: Dental tenderness, gingival swelling and dental caries present.     Comments: Moderate right facial swelling no trismus.  Eyes:     Conjunctiva/sclera: Conjunctivae normal.  Pulmonary:     Effort: Pulmonary effort is normal.  Musculoskeletal:        General: Normal range of motion.     Cervical back: Normal range of motion.  Skin:    General: Skin is warm and dry.     Findings: No rash.  Neurological:     Mental Status: She is alert.  Psychiatric:        Mood and Affect: Mood normal.      UC Treatments / Results  Labs (all labs ordered are listed, but only abnormal results are displayed) Labs Reviewed - No data to display  EKG   Radiology No results found.  Procedures Procedures (including critical care time)  Medications Ordered in UC Medications  cefTRIAXone (ROCEPHIN) injection 1 g (has no administration in time range)  ketorolac (TORADOL) 30 MG/ML injection 30 mg (30 mg Intramuscular Given 02/06/20 1703)    Initial Impression / Assessment and Plan / UC Course  I have reviewed the triage vital signs and the nursing notes.  Pertinent labs & imaging results that were available during my care of the patient were reviewed by me and considered in my medical decision making (see chart for details).     Dental pain, dental infection. Giving Rocephin injection here in clinic and Toradol for pain prior to discharge Will prescribe clindamycin for outpatient  treatment.  600 of ibuprofen every 8 hours as needed and hydrocodone for severe pain as needed. Dental resources placed in discharge instructions Final Clinical Impressions(s) / UC Diagnoses   Final diagnoses:  Dental infection  Pain,  dental     Discharge Instructions     Treating you for a dental infection.  Take the medication as prescribed. You can take 600 mg ibuprofen every 8 hours for pain and hydrocodone for severe pain as needed. I have given some information in your discharge instructions for follow-up for dental    ED Prescriptions    Medication Sig Dispense Auth. Provider   clindamycin (CLEOCIN) 150 MG capsule Take 2 capsules (300 mg total) by mouth 3 (three) times daily for 7 days. 42 capsule Avyn Aden A, NP   ibuprofen (ADVIL) 600 MG tablet Take 1 tablet (600 mg total) by mouth every 8 (eight) hours as needed for moderate pain. 30 tablet Heloise Gordan A, NP   HYDROcodone-acetaminophen (NORCO/VICODIN) 5-325 MG tablet Take 1-2 tablets by mouth every 6 (six) hours as needed. 10 tablet Beulah Capobianco A, NP     I have reviewed the PDMP during this encounter.   Orvan July, NP 02/06/20 1705

## 2020-02-06 NOTE — ED Triage Notes (Signed)
C/o R lower dental abscess x 3 days.  Denies fever and chills.

## 2021-02-22 ENCOUNTER — Other Ambulatory Visit: Payer: Self-pay

## 2021-02-22 ENCOUNTER — Ambulatory Visit (HOSPITAL_COMMUNITY)
Admission: EM | Admit: 2021-02-22 | Discharge: 2021-02-22 | Disposition: A | Discharge status: Home / Self Care | Payer: Self-pay | Attending: Internal Medicine | Admitting: Internal Medicine

## 2021-02-22 DIAGNOSIS — H5712 Ocular pain, left eye: Secondary | ICD-10-CM

## 2021-02-22 MED ORDER — POLYMYXIN B-TRIMETHOPRIM 10000-0.1 UNIT/ML-% OP SOLN: 1.0000 [drp] | Freq: Four times a day (QID) | OPHTHALMIC | 0 refills | Status: DC

## 2021-02-22 NOTE — Discharge Instructions (Addendum)
Please use medications as prescribed Follow-up with ophthalmologist for detailed eye exam.

## 2021-02-22 NOTE — ED Provider Notes (Signed)
[name]    CSN: [id number] Arrival date & time: 02/22/21  1539      History   Chief Complaint No chief complaint on file.   HPI [name] is a 55 y.o. female comes to the urgent care with 1 week history of left eye pain with a black spot in her visual field.  Patient is 33-year-old grandson accidentally hit her left eye with the feeding bottle.  Following that event patient has had pain in the left eye with black dot in the left visual field.  No double vision or blurry vision.  No floaters in her visual field.  No light sensitivity.  No headaches. 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 ([place]) 08/12/2015   Type 2 diabetes mellitus, uncontrolled, with renal complications 97/74/1423   HTN (hypertension) 08/12/2015   Dental cavities 05/10/2015   Sepsis ([place]) 03/04/2015   Hyponatremia 03/04/2015   Hypokalemia 03/04/2015   DKA (diabetic ketoacidoses) 03/04/2015   Diabetes mellitus with hyperglycemia, without long-term current use of insulin ([place]) 03/03/2015   Pyelonephritis 03/03/2015   AKI (acute kidney injury) ([place]) 03/03/2015   Upper respiratory disease 03/08/2014   Tooth abscess 03/08/2014   Chest pain 06/25/2011    Past Surgical History:  Procedure Laterality Date   CHOLECYSTECTOMY     TUBAL LIGATION      OB History   No obstetric history on file.      Home Medications    Prior to Admission medications   Medication Sig Start Date End Date Taking? Authorizing Provider  trimethoprim-polymyxin b (POLYTRIM) ophthalmic solution Place 1 drop into the left eye every 6 (six) hours. 02/22/21  Yes Tiffanye Hartmann, Myrene Galas, MD  Blood Glucose Monitoring Suppl (TRUE METRIX METER) w/Device KIT Use to check blood sugar 3 times daily 11/03/17   [name], [name], MD   furosemide (LASIX) 20 MG tablet As needed 1 daily prn for leg swelling 12/03/17   Freeman Caldron M, [name]  glipiZIDE (GLIPIZIDE XL) 10 MG 24 hr tablet Take 1 tablet (10 mg total) by mouth daily with breakfast. To lower blood sugar 11/03/17   [name], [name], MD  glucose blood (TRUE METRIX BLOOD GLUCOSE TEST) test strip Use to check blood sugar three times per day 11/03/17   [name], [name], MD  HYDROcodone-acetaminophen (NORCO/VICODIN) 5-325 MG tablet Take 1-2 tablets by mouth every 6 (six) hours as needed. 02/06/20   [name], NP  ibuprofen (ADVIL) 600 MG tablet Take 1 tablet (600 mg total) by mouth every 8 (eight) hours as needed for moderate pain. 02/06/20   [name], NP  lisinopril-hydrochlorothiazide (PRINZIDE,ZESTORETIC) 10-12.5 MG tablet Take 2 tablets by mouth daily. To lower blood pressure 12/11/17   [name], [name], MD  metFORMIN (GLUCOPHAGE) 850 MG tablet Take 1 tablet (850 mg total) by mouth 2 (two) times daily with a meal. 11/03/17   [name], [name], MD  TRUEPLUS LANCETS 28G MISC Use to check blood sugar 3 times daily 11/03/17   [name], MD    Family History Family History  Problem Relation Age of Onset   Diabetes Mother     Social History Social History   Tobacco Use   Smoking status: Former    Packs/day: 0.00    Types:  Cigarettes   Smokeless tobacco: Never   Tobacco comments:    Smoked when she was a teenager.   Vaping Use   Vaping Use: Never used  Substance Use Topics   Alcohol use: Yes    Comment: occasionally   Drug use: No     Allergies   Penicillins   Review of Systems Review of Systems As per HPI  Physical Exam Triage Vital Signs ED Triage Vitals  Enc Vitals Group     BP 02/22/21 1652 (!) 159/93     Pulse Rate 02/22/21 1652 81     Resp 02/22/21 1646 18     Temp 02/22/21 1646 98.7 F (37.1 C)     Temp src --      SpO2 02/22/21 1646 100 %     Weight --      Height --      Head Circumference --      Peak Flow --      Pain Score 02/22/21 1740 0      Pain Loc --      Pain Edu? --      Excl. in [place]? --    No data found.  Updated Vital Signs BP (!) 159/93 (BP Location: Left Arm)    Pulse 81    Temp 98.7 F (37.1 C)    Resp 18    LMP 05/26/2017    SpO2 100%   Visual Acuity Right Eye Distance:   Left Eye Distance:   Bilateral Distance:    Right Eye Near:   Left Eye Near:    Bilateral Near:     Physical Exam Vitals and nursing note reviewed.  HENT:     Mouth/Throat:     Mouth: Mucous membranes are moist.  Eyes:     General:        Right eye: No discharge.        Left eye: No discharge.     Extraocular Movements: Extraocular movements intact.     Conjunctiva/sclera: Conjunctivae normal.     Pupils: Pupils are equal, round, and reactive to light.  Neurological:     General: No focal deficit present.     Mental Status: She is oriented to person, place, and time.     UC Treatments / Results  Labs (all labs ordered are listed, but only abnormal results are displayed) Labs Reviewed - No data to display  EKG   Radiology No results found.  Procedures Procedures (including critical care time)  Medications Ordered in UC Medications - No data to display  Initial Impression / Assessment and Plan / UC Course  I have reviewed the triage vital signs and the nursing notes.  Pertinent labs & imaging results that were available during my care of the patient were reviewed by me and considered in my medical decision making (see chart for details).     1.  Left eye pain: Patient is advised to follow-up with an ophthalmologist for funduscopic eye exam.  Patient wanted to try some antibiotic eyedrops while she makes an appointment to see an ophthalmologist.  Advised patient that the eyedrops Minimal make much of a difference in the visual abnormalities that she is describing.  She promises to make an appointment with an ophthalmologist for further evaluation. Final Clinical Impressions(s) / UC Diagnoses   Final  diagnoses:  Pain in the eye, left     Discharge Instructions      Please use medications as prescribed Follow-up with ophthalmologist for detailed  eye exam.   ED Prescriptions     Medication Sig Dispense Auth. Provider   trimethoprim-polymyxin b (POLYTRIM) ophthalmic solution Place 1 drop into the left eye every 6 (six) hours. 10 mL Choya Tornow, Myrene Galas, MD      PDMP not reviewed this encounter.   [name], MD 02/22/21 [id number]

## 2021-06-14 ENCOUNTER — Ambulatory Visit: Payer: Self-pay | Admitting: Physician Assistant

## 2021-06-18 ENCOUNTER — Ambulatory Visit (INDEPENDENT_AMBULATORY_CARE_PROVIDER_SITE_OTHER): Payer: Managed Care, Other (non HMO) | Admitting: Physician Assistant

## 2021-06-18 ENCOUNTER — Encounter: Payer: Self-pay | Admitting: Physician Assistant

## 2021-06-18 VITALS — BP 151/98 | HR 86 | Temp 98.9°F | Resp 18 | Ht 66.0 in | Wt 259.0 lb

## 2021-06-18 DIAGNOSIS — I10 Essential (primary) hypertension: Secondary | ICD-10-CM | POA: Diagnosis not present

## 2021-06-18 DIAGNOSIS — Z114 Encounter for screening for human immunodeficiency virus [HIV]: Secondary | ICD-10-CM

## 2021-06-18 DIAGNOSIS — R809 Proteinuria, unspecified: Secondary | ICD-10-CM

## 2021-06-18 DIAGNOSIS — E1122 Type 2 diabetes mellitus with diabetic chronic kidney disease: Secondary | ICD-10-CM

## 2021-06-18 DIAGNOSIS — E1129 Type 2 diabetes mellitus with other diabetic kidney complication: Secondary | ICD-10-CM | POA: Diagnosis not present

## 2021-06-18 DIAGNOSIS — E871 Hypo-osmolality and hyponatremia: Secondary | ICD-10-CM | POA: Diagnosis not present

## 2021-06-18 DIAGNOSIS — Z6841 Body Mass Index (BMI) 40.0 and over, adult: Secondary | ICD-10-CM

## 2021-06-18 DIAGNOSIS — Z1159 Encounter for screening for other viral diseases: Secondary | ICD-10-CM

## 2021-06-18 NOTE — Progress Notes (Signed)
? ?New Patient Office Visit ? ?Subjective   ? ?Patient ID: [name], female    DOB: Nov 04, 1965  Age: 56 y.o. MRN: [id number] ? ?CC:  ?Chief Complaint  ?Patient presents with  ? Medication Refill  ?  BP/DM  ? ? ?HPI ?[name] presents for medication refills.  States that she has been out of her blood pressure medication for 2 months, has been out of her metformin for 1 month. ? ?States that she does not check her blood pressure or blood glucose levels at home. ? ?States that she has been having headaches, states that she is unsure if this is related to her blood pressure.  States that she has been using extra strength Tylenol with relief. ? ? ? ?Outpatient Encounter Medications as of 06/18/2021  ?Medication Sig  ? Blood Glucose Monitoring Suppl (TRUE METRIX METER) w/Device KIT Use to check blood sugar 3 times daily  ? furosemide (LASIX) 20 MG tablet As needed 1 daily prn for leg swelling  ? glipiZIDE (GLIPIZIDE XL) 10 MG 24 hr tablet Take 1 tablet (10 mg total) by mouth daily with breakfast. To lower blood sugar  ? glucose blood (TRUE METRIX BLOOD GLUCOSE TEST) test strip Use to check blood sugar three times per day  ? HYDROcodone-acetaminophen (NORCO/VICODIN) 5-325 MG tablet Take 1-2 tablets by mouth every 6 (six) hours as needed.  ? ibuprofen (ADVIL) 600 MG tablet Take 1 tablet (600 mg total) by mouth every 8 (eight) hours as needed for moderate pain.  ? lisinopril-hydrochlorothiazide (PRINZIDE,ZESTORETIC) 10-12.5 MG tablet Take 2 tablets by mouth daily. To lower blood pressure  ? metFORMIN (GLUCOPHAGE) 850 MG tablet Take 1 tablet (850 mg total) by mouth 2 (two) times daily with a meal.  ? trimethoprim-polymyxin b (POLYTRIM) ophthalmic solution Place 1 drop into the left eye every 6 (six) hours.  ? TRUEPLUS LANCETS 28G MISC Use to check blood sugar 3 times daily  ? ?No facility-administered encounter medications on file as of 06/18/2021.  ? ? ?Past Medical History:  ?Diagnosis Date  ? Asthma   ? Child  ?  Diabetes mellitus   ? Obesity   ? ? ?Past Surgical History:  ?Procedure Laterality Date  ? CHOLECYSTECTOMY    ? TUBAL LIGATION    ? ? ?Family History  ?Problem Relation Age of Onset  ? Diabetes Mother   ? ? ?Social History  ? ?Socioeconomic History  ? Marital status: Divorced  ?  Spouse name: Not on file  ? Number of children: Not on file  ? Years of education: Not on file  ? Highest education level: Not on file  ?Occupational History  ? Not on file  ?Tobacco Use  ? Smoking status: Former  ?  Packs/day: 0.00  ?  Types: Cigarettes  ? Smokeless tobacco: Never  ? Tobacco comments:  ?  Smoked when she was a teenager.   ?Vaping Use  ? Vaping Use: Never used  ?Substance and Sexual Activity  ? Alcohol use: Yes  ?  Comment: occasionally  ? Drug use: No  ? Sexual activity: Yes  ?Other Topics Concern  ? Not on file  ?Social History Narrative  ? Lives with 3 children in [place]. Does not work   ? ?Social Determinants of Health  ? ?Financial Resource Strain: Not on file  ?Food Insecurity: Not on file  ?Transportation Needs: Not on file  ?Physical Activity: Not on file  ?Stress: Not on file  ?Social Connections: Not on file  ?Intimate Partner  Violence: Not on file  ? ? ?Review of Systems  ?Constitutional: Negative.   ?HENT: Negative.    ?Eyes: Negative.   ?Respiratory:  Negative for shortness of breath.   ?Cardiovascular:  Negative for chest pain and leg swelling.  ?Gastrointestinal: Negative.   ?Genitourinary: Negative.   ?Musculoskeletal: Negative.   ?Skin: Negative.   ?Neurological:  Negative for headaches.  ?Endo/Heme/Allergies: Negative.   ?Psychiatric/Behavioral: Negative.    ? ?  ? ? ?Objective   ? ?BP (!) 151/98 (BP Location: Right Arm, Patient Position: Sitting, Cuff Size: Large)   Pulse 86   Temp 98.9 ?F (37.2 ?C) (Oral)   Resp 18   Ht '5\' 6"'  (1.676 m)   Wt 259 lb (117.5 kg)   LMP 05/26/2017   SpO2 96%   BMI 41.80 kg/m?  ? ?Physical Exam ?Vitals and nursing note reviewed.  ?Constitutional:   ?   Appearance:  Normal appearance. She is obese.  ?HENT:  ?   Head: Normocephalic and atraumatic.  ?   Right Ear: External ear normal.  ?   Left Ear: External ear normal.  ?   Nose: Nose normal.  ?   Mouth/Throat:  ?   Mouth: Mucous membranes are moist.  ?   Pharynx: Oropharynx is clear.  ?Eyes:  ?   Extraocular Movements: Extraocular movements intact.  ?   Conjunctiva/sclera: Conjunctivae normal.  ?   Pupils: Pupils are equal, round, and reactive to light.  ?Cardiovascular:  ?   Rate and Rhythm: Regular rhythm.  ?   Pulses: Normal pulses.  ?   Heart sounds: Normal heart sounds.  ?Pulmonary:  ?   Effort: Pulmonary effort is normal.  ?   Breath sounds: Normal breath sounds.  ?Musculoskeletal:     ?   General: Normal range of motion.  ?   Cervical back: Normal range of motion and neck supple.  ?Skin: ?   General: Skin is warm and dry.  ?Neurological:  ?   General: No focal deficit present.  ?   Mental Status: She is alert and oriented to person, place, and time.  ?Psychiatric:     ?   Mood and Affect: Mood normal.     ?   Behavior: Behavior normal.     ?   Thought Content: Thought content normal.     ?   Judgment: Judgment normal.  ? ? ? ?  ? ?Assessment & Plan:  ? ?Problem List Items Addressed This Visit   ? ?  ? Cardiovascular and Mediastinum  ? HTN (hypertension) (Chronic)  ?  ? Endocrine  ? 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 DM (diabetes mellitus), type 2 ([place]) - Primary  ? Relevant Orders  ? CBC with Differential/Platelet  ? Comp. Metabolic Panel (12)  ? Lipid panel  ? TSH  ? Microalbumin / creatinine urine ratio  ? Hemoglobin A1c  ?  ? Other  ? Hyponatremia  ? ?Other Visit Diagnoses   ? ? Screening for HIV (human immunodeficiency virus)      ? Relevant Orders  ? HIV antibody (with reflex)  ? Encounter for HCV screening test for low risk patient      ? Relevant Orders  ? HCV Ab w Reflex to Quant PCR  ? Class 3 severe obesity due to excess calories with serious comorbidity and body mass index (BMI) of  40.0 to 44.9 in adult [facility])      ? ?  ? ?1.  Type 2 diabetes mellitus with chronic kidney disease, without long-term current use of insulin, unspecified CKD stage ([place]) ?[facility].  Medication last prescribed at that time, patient adamant that she has only recently run out of of her medication.  Adamant that she had no other providers, was only [facility]. ? ?Last GFR in September 2019 was 53.  Explained to patient that labs need to be completed prior to restarting medications due to history of reduced kidney function.  Patient adamantly refused labs being completed in clinic today, did agree to have them done Wednesday at [place].  Will restart medications based on lab results once they are resulted. ? ?Patient given appointment to establish care at [facility], this location is closer for her. ? ?Red flags given for prompt reevaluation. ?- CBC with Differential/Platelet; Future ?- Comp. Metabolic Panel (12); Future ?- Lipid panel; Future ?- TSH; Future ?- Microalbumin / creatinine urine ratio; Future ?- Hemoglobin A1c; Future ? ?2. 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 ? ?3. Hyponatremia ? ? ?4. Primary hypertension ?Patient encouraged to check blood pressure at home, keep a written log and have available for all office visits. ? ?5. Screening for HIV (human immunodeficiency virus) ? ?- HIV antibody (with reflex); Future ? ?6. Encounter for HCV screening test for low risk patient ? ?- HCV Ab w Reflex to Quant PCR; Future ? ?7. Class 3 severe obesity due to excess calories with serious comorbidity and body mass index (BMI) of 40.0 to 44.9 in adult [facility]) ? ? ? ?I have reviewed the patient's medical history (PMH, PSH, Social History, Family History, Medications, and allergies) , and have been updated if relevant. I spent 30 minutes reviewing chart and  face to face time with  patient. ? ? ? ?Return for To establish PCP, with Dr. Redmond Pulling at [place].  ? ?Karah Caruthers S Mayers, [name] ? ? ?

## 2021-06-18 NOTE — Patient Instructions (Signed)
We will call you with your lab results when they are available.  Based on your lab results, I will refill your medications and send them to the [facility] on [address]. ? ?Kennieth Rad, [name] ?Physician Assistant ?[place] ?[email or web address] ? ? ?Health Maintenance, Female ?Adopting a healthy lifestyle and getting preventive care are important in promoting health and wellness. Ask your health care provider about: ?The right schedule for you to have regular tests and exams. ?Things you can do on your own to prevent diseases and keep yourself healthy. ?What should I know about diet, weight, and exercise? ?Eat a healthy diet ? ?Eat a diet that includes plenty of vegetables, fruits, low-fat dairy products, and lean protein. ?Do not eat a lot of foods that are high in solid fats, added sugars, or sodium. ?Maintain a healthy weight ?Body mass index (BMI) is used to identify weight problems. It estimates body fat based on height and weight. Your health care provider can help determine your BMI and help you achieve or maintain a healthy weight. ?Get regular exercise ?Get regular exercise. This is one of the most important things you can do for your health. Most adults should: ?Exercise for at least 150 minutes each week. The exercise should increase your heart rate and make you sweat (moderate-intensity exercise). ?Do strengthening exercises at least twice a week. This is in addition to the moderate-intensity exercise. ?Spend less time sitting. Even light physical activity can be beneficial. ?Watch cholesterol and blood lipids ?Have your blood tested for lipids and cholesterol at 54 years of age, then have this test every 5 years. ?Have your cholesterol levels checked more often if: ?Your lipid or cholesterol levels are high. ?You are older than 54 years of age. ?You are at high risk for heart disease. ?What should I know about cancer screening? ?Depending on  your health history and family history, you may need to have cancer screening at various ages. This may include screening for: ?Breast cancer. ?Cervical cancer. ?Colorectal cancer. ?Skin cancer. ?Lung cancer. ?What should I know about heart disease, diabetes, and high blood pressure? ?Blood pressure and heart disease ?High blood pressure causes heart disease and increases the risk of stroke. This is more likely to develop in people who have high blood pressure readings or are overweight. ?Have your blood pressure checked: ?Every 3-5 years if you are 67-37 years of age. ?Every year if you are 40 years old or older. ?Diabetes ?Have regular diabetes screenings. This checks your fasting blood sugar level. Have the screening done: ?Once every three years after age 44 if you are at a normal weight and have a low risk for diabetes. ?More often and at a younger age if you are overweight or have a high risk for diabetes. ?What should I know about preventing infection? ?Hepatitis B ?If you have a higher risk for hepatitis B, you should be screened for this virus. Talk with your health care provider to find out if you are at risk for hepatitis B infection. ?Hepatitis C ?Testing is recommended for: ?Everyone born from [birth year] through 1963. ?Anyone with known risk factors for hepatitis C. ?Sexually transmitted infections (STIs) ?Get screened for STIs, including gonorrhea and chlamydia, if: ?You are sexually active and are younger than 54 years of age. ?You are older than 54 years of age and your health care provider tells you that you are at risk for this type of infection. ?Your sexual activity has changed since you were last screened, and  you are at increased risk for chlamydia or gonorrhea. Ask your health care provider if you are at risk. ?Ask your health care provider about whether you are at high risk for HIV. Your health care provider may recommend a prescription medicine to help prevent HIV infection. If you choose to take  medicine to prevent HIV, you should first get tested for HIV. You should then be tested every 3 months for as long as you are taking the medicine. ?Pregnancy ?If you are about to stop having your period (premenopausal) and you may become pregnant, seek counseling before you get pregnant. ?Take 400 to 800 micrograms (mcg) of folic acid every day if you become pregnant. ?Ask for birth control (contraception) if you want to prevent pregnancy. ?Osteoporosis and menopause ?Osteoporosis is a disease in which the bones lose minerals and strength with aging. This can result in bone fractures. If you are 80 years old or older, or if you are at risk for osteoporosis and fractures, ask your health care provider if you should: ?Be screened for bone loss. ?Take a calcium or vitamin D supplement to lower your risk of fractures. ?Be given hormone replacement therapy (HRT) to treat symptoms of menopause. ?Follow these instructions at home: ?Alcohol use ?Do not drink alcohol if: ?Your health care provider tells you not to drink. ?You are pregnant, may be pregnant, or are planning to become pregnant. ?If you drink alcohol: ?Limit how much you have to: ?0-1 drink a day. ?Know how much alcohol is in your drink. In the [place] one drink equals one 12 oz bottle of beer (355 mL), one 5 oz glass of wine (148 mL), or one 1? oz glass of hard liquor (44 mL). ?Lifestyle ?Do not use any products that contain nicotine or tobacco. These products include cigarettes, chewing tobacco, and vaping devices, such as e-cigarettes. If you need help quitting, ask your health care provider. ?Do not use street drugs. ?Do not share needles. ?Ask your health care provider for help if you need support or information about quitting drugs. ?General instructions ?Schedule regular health, dental, and eye exams. ?Stay current with your vaccines. ?Tell your health care provider if: ?You often feel depressed. ?You have ever been abused or do not feel safe at  home. ?Summary ?Adopting a healthy lifestyle and getting preventive care are important in promoting health and wellness. ?Follow your health care provider's instructions about healthy diet, exercising, and getting tested or screened for diseases. ?Follow your health care provider's instructions on monitoring your cholesterol and blood pressure. ?This information is not intended to replace advice given to you by your health care provider. Make sure you discuss any questions you have with your health care provider. ?Document Revised: 07/03/2020 Document Reviewed: 07/03/2020 ?Elsevier Patient Education ? [employer]. ? ?

## 2021-07-14 ENCOUNTER — Emergency Department (HOSPITAL_COMMUNITY): Payer: Commercial Managed Care - HMO

## 2021-07-14 ENCOUNTER — Other Ambulatory Visit: Payer: Self-pay

## 2021-07-14 ENCOUNTER — Encounter (HOSPITAL_COMMUNITY): Payer: Self-pay | Admitting: Emergency Medicine

## 2021-07-14 ENCOUNTER — Emergency Department (HOSPITAL_COMMUNITY)
Admission: EM | Admit: 2021-07-14 | Discharge: 2021-07-14 | Disposition: A | Discharge status: Home / Self Care | Payer: Commercial Managed Care - HMO | Attending: Emergency Medicine | Admitting: Emergency Medicine

## 2021-07-14 DIAGNOSIS — E119 Type 2 diabetes mellitus without complications: Secondary | ICD-10-CM | POA: Diagnosis not present

## 2021-07-14 DIAGNOSIS — Z79899 Other long term (current) drug therapy: Secondary | ICD-10-CM | POA: Insufficient documentation

## 2021-07-14 DIAGNOSIS — Z7984 Long term (current) use of oral hypoglycemic drugs: Secondary | ICD-10-CM | POA: Diagnosis not present

## 2021-07-14 DIAGNOSIS — J45909 Unspecified asthma, uncomplicated: Secondary | ICD-10-CM | POA: Insufficient documentation

## 2021-07-14 DIAGNOSIS — J181 Lobar pneumonia, unspecified organism: Secondary | ICD-10-CM | POA: Insufficient documentation

## 2021-07-14 DIAGNOSIS — I10 Essential (primary) hypertension: Secondary | ICD-10-CM | POA: Insufficient documentation

## 2021-07-14 DIAGNOSIS — J189 Pneumonia, unspecified organism: Secondary | ICD-10-CM

## 2021-07-14 DIAGNOSIS — R059 Cough, unspecified: Secondary | ICD-10-CM | POA: Diagnosis present

## 2021-07-14 MED ORDER — LEVOFLOXACIN 750 MG PO TABS: 750.0000 mg | ORAL_TABLET | Freq: Every day | ORAL | 0 refills | Status: AC

## 2021-07-14 MED ORDER — ALBUTEROL SULFATE HFA 108 (90 BASE) MCG/ACT IN AERS
1.0000 | INHALATION_SPRAY | Freq: Four times a day (QID) | RESPIRATORY_TRACT | 0 refills | Status: DC | PRN
  Filled 2021-07-14: qty 18, fill #0

## 2021-07-14 MED ORDER — ALBUTEROL SULFATE HFA 108 (90 BASE) MCG/ACT IN AERS: 1.0000 | INHALATION_SPRAY | Freq: Four times a day (QID) | RESPIRATORY_TRACT | 0 refills | Status: DC | PRN

## 2021-07-14 MED ORDER — LEVOFLOXACIN 750 MG PO TABS
750.0000 mg | ORAL_TABLET | Freq: Every day | ORAL | 0 refills | Status: DC
  Filled 2021-07-14: qty 5, 5d supply, fill #0

## 2021-07-14 NOTE — ED Provider Notes (Signed)
[facility] EMERGENCY DEPARTMENT Provider Note   CSN: [id number] Arrival date & time: 07/14/21  1330     History  No chief complaint on file.   [name] is a 56 y.o. female with a history of asthma, diabetes mellitus, hypertension, obesity.  Presents to the emergency department with a chief complaint of productive cough and wheezing.  Patient reports that she has been having the symptoms intermittently over the last 2 weeks.  Reports the cough is producing clear mucus.  Patient reports that she has also been wheezing intermittently.  Patient states that she does feel short of breath when wheezing and coughing.  Patient endorses a history of asthma but states that she does not have any albuterol inhaler.  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  Patient denies any chest pain, palpitations, leg swelling or tenderness, hemoptysis, abdominal pain, nausea, vomiting, diarrhea, history of DVT/PE, surgery in the last 4 weeks, hormone therapy, history of malignancy.   HPI     Home Medications Prior to Admission medications   Medication Sig Start Date End Date Taking? Authorizing Provider  Blood Glucose Monitoring Suppl (TRUE METRIX METER) w/Device KIT Use to check blood sugar 3 times daily 11/03/17   [name], [name], MD  furosemide (LASIX) 20 MG tablet As needed 1 daily prn for leg swelling 12/03/17   Freeman Caldron M, [name]  glipiZIDE (GLIPIZIDE XL) 10 MG 24 hr tablet Take 1 tablet (10 mg total) by mouth daily with breakfast. To lower blood sugar 11/03/17   [name], [name], MD  glucose blood (TRUE METRIX BLOOD GLUCOSE TEST) test strip Use to check blood sugar three times per day 11/03/17   [name], [name], MD  HYDROcodone-acetaminophen (NORCO/VICODIN) 5-325 MG tablet Take 1-2 tablets by mouth every 6 (six) hours as needed. 02/06/20   [name], NP  ibuprofen (ADVIL) 600 MG tablet Take 1 tablet (600 mg total) by mouth  every 8 (eight) hours as needed for moderate pain. 02/06/20   [name], NP  lisinopril-hydrochlorothiazide (PRINZIDE,ZESTORETIC) 10-12.5 MG tablet Take 2 tablets by mouth daily. To lower blood pressure 12/11/17   [name], [name], MD  metFORMIN (GLUCOPHAGE) 850 MG tablet Take 1 tablet (850 mg total) by mouth 2 (two) times daily with a meal. 11/03/17   [name], [name], MD  trimethoprim-polymyxin b (POLYTRIM) ophthalmic solution Place 1 drop into the left eye every 6 (six) hours. 02/22/21   [name], MD  TRUEPLUS LANCETS 28G MISC Use to check blood sugar 3 times daily 11/03/17   [name], MD      Allergies    Penicillins    Review of Systems   Review of Systems  Constitutional:  Negative for chills and fever.  HENT:  Negative for congestion and rhinorrhea.   Eyes:  Negative for visual disturbance.  Respiratory:  Positive for cough, shortness of breath and wheezing.   Cardiovascular:  Negative for chest pain, palpitations and leg swelling.  Gastrointestinal:  Negative for abdominal pain, diarrhea, nausea and vomiting.  Musculoskeletal:  Negative for back pain and neck pain.  Skin:  Negative for color change and rash.  Neurological:  Negative for dizziness, syncope, light-headedness and headaches.  Psychiatric/Behavioral:  Negative for confusion.    Physical Exam Updated Vital Signs BP (!) 159/92 (BP Location: Right Arm)   Pulse 93   Temp 98.6 F (37 C) (Oral)   Resp 18   LMP 05/26/2017   SpO2  100%  Physical Exam Vitals and nursing note reviewed.  Constitutional:      General: She is not in acute distress.    Appearance: She is not ill-appearing, toxic-appearing or diaphoretic.  HENT:     Head: Normocephalic.  Eyes:     General: No scleral icterus.       Right eye: No discharge.        Left eye: No discharge.  Cardiovascular:     Rate and Rhythm: Normal rate.     Pulses:          Radial pulses are 2+ on the right side and 2+ on the left side.  Pulmonary:     Effort:  Pulmonary effort is normal. No tachypnea, bradypnea or respiratory distress.     Breath sounds: Normal breath sounds. No stridor.     Comments: Speaks in full complete sentences without difficulty Abdominal:     General: Abdomen is protuberant. There is no distension. There are no signs of injury.     Palpations: There is no mass or pulsatile mass.     Tenderness: There is no abdominal tenderness. There is no guarding or rebound.  Musculoskeletal:     Right lower leg: No swelling, deformity, lacerations, tenderness or bony tenderness. No edema.     Left lower leg: No swelling, deformity, lacerations, tenderness or bony tenderness. No edema.  Skin:    General: Skin is warm and dry.  Neurological:     General: No focal deficit present.     Mental Status: She is alert.  Psychiatric:        Behavior: Behavior is cooperative.    ED Results / Procedures / Treatments   Labs (all labs ordered are listed, but only abnormal results are displayed) Labs Reviewed - No data to display  EKG EKG Interpretation  Date/Time:  Saturday Jul 14 2021 13:42:52 EDT Ventricular Rate:  92 PR Interval:  166 QRS Duration: 76 QT Interval:  370 QTC Calculation: 457 R Axis:   66 Text Interpretation: Normal sinus rhythm Nonspecific T wave abnormality Abnormal ECG When compared with ECG of 20-Jun-2015 00:43, PREVIOUS ECG IS PRESENT Confirmed by Dene Gentry [phone]) on 07/14/2021 2:21:35 PM  Radiology DG Chest 2 View  Result Date: 07/14/2021 CLINICAL DATA:  Shortness of breath EXAM: CHEST - 2 VIEW COMPARISON:  03/08/2014 FINDINGS: Transverse diameter of heart is increased. There are no signs of pulmonary edema. There is poor inspiration. Linear densities seen in the right lower lung fields. There is no significant pleural effusion or pneumothorax. IMPRESSION: Linear densities in the right lower lung fields may suggest crowding of bronchovascular structures due to poor inspiration or subsegmental  atelectasis/pneumonia. There are no signs of alveolar pulmonary edema or focal pulmonary consolidation. Electronically Signed   By: Elmer Picker M.D.   On: 07/14/2021 14:41    Procedures Procedures    Medications Ordered in ED Medications - No data to display  ED Course/ Medical Decision Making/ A&P                           Medical Decision Making Amount and/or Complexity of Data Reviewed Radiology: ordered.  Risk Prescription drug management.   Alert 56 year old female in no acute distress, nontoxic-appearing.  Presents to the ED with a chief complaint of productive cough and wheezing.  Information obtained from patient.  Past medical records were reviewed including previous provider notes, labs, and imaging.  Patient has medical history as  outlined in HPI which complicates her care.  Due to reports of productive cough of the last few weeks concern for possible pneumonia.  Chest x-ray to be obtained.  Patient was offered testing for COVID-19 and influenza however declines testing at this time.  At this time patient is not having any wheezing or difficulty breathing we will hold any albuterol inhaler or nebulizer treatment.    I personally viewed and interpreted patient's EKG.  Tracing shows sinus rhythm with nonspecific T wave abnormality.   I personally viewed and interpreted patient's chest x-ray.  Agree with radiology interpretation of linear densities in the right lower lung which may suggest crowding bronchovascular structures versus atelectasis/pneumonia.    With patient having productive cough x2 weeks we will cover with antibiotics for possible pneumonia.  Antibiotic shortness of Levaquin was chosen due to patient's reports of severe allergy to penicillins.  Additionally will send patient home with albuterol inhaler.  Patient to follow-up with PCP in outpatient setting.  Based on patient's chief complaint, I considered admission might be necessary, however after  reassuring ED workup feel patient is reasonable for discharge.  Discussed results, findings, treatment and follow up. Patient advised of return precautions. Patient verbalized understanding and agreed with plan.  Portions of this note were generated with [employer]. Dictation errors may occur despite best attempts at proofreading.         Final Clinical Impression(s) / ED Diagnoses Final diagnoses:  Community acquired pneumonia of right lower lobe of lung    Rx / DC Orders ED Discharge Orders          Ordered    albuterol (VENTOLIN HFA) 108 (90 Base) MCG/ACT inhaler  Every 6 hours PRN,   Status:  Discontinued        07/14/21 1523    levofloxacin (LEVAQUIN) 750 MG tablet  Daily,   Status:  Discontinued        07/14/21 1523    albuterol (VENTOLIN HFA) 108 (90 Base) MCG/ACT inhaler  Every 6 hours PRN        07/14/21 1538    levofloxacin (LEVAQUIN) 750 MG tablet  Daily        07/14/21 1538              Dyann Ruddle 07/14/21 1649    Valarie Merino, MD 07/18/21 1544

## 2021-07-14 NOTE — ED Triage Notes (Signed)
C/o SOB, wheezing, and productive cough with clear phlegm x 2 weeks.  Pain only in abd with coughing.

## 2021-07-14 NOTE — Discharge Instructions (Addendum)
You came to the emergency department today to be evaluated for your wheezing and cough.  Your chest x-ray showed signs consistent with pneumonia.  Due to this you were started on the antibiotic Levaquin, please take this medication as prescribed.  I have also given you an albuterol inhaler, you may use 1 to 2 puffs every 6 hours as needed for wheezing or shortness of breath.  Please follow-up with your primary care doctor for repeat evaluation.  Get help right away if: You are short of breath and this gets worse. You have more chest pain. Your sickness gets worse. This is very serious if: You are an older adult. Your body's defense system is weak. You cough up blood.

## 2021-07-16 ENCOUNTER — Other Ambulatory Visit: Payer: Self-pay

## 2021-07-19 ENCOUNTER — Ambulatory Visit (INDEPENDENT_AMBULATORY_CARE_PROVIDER_SITE_OTHER): Payer: Commercial Managed Care - HMO | Admitting: Family Medicine

## 2021-07-19 ENCOUNTER — Encounter: Payer: Self-pay | Admitting: Family Medicine

## 2021-07-19 VITALS — BP 149/96 | HR 84 | Temp 98.1°F | Resp 16 | Ht 66.0 in | Wt 260.0 lb

## 2021-07-19 DIAGNOSIS — E1122 Type 2 diabetes mellitus with diabetic chronic kidney disease: Secondary | ICD-10-CM | POA: Diagnosis not present

## 2021-07-19 DIAGNOSIS — J4540 Moderate persistent asthma, uncomplicated: Secondary | ICD-10-CM | POA: Diagnosis not present

## 2021-07-19 DIAGNOSIS — E1165 Type 2 diabetes mellitus with hyperglycemia: Secondary | ICD-10-CM | POA: Diagnosis not present

## 2021-07-19 DIAGNOSIS — R600 Localized edema: Secondary | ICD-10-CM

## 2021-07-19 DIAGNOSIS — I10 Essential (primary) hypertension: Secondary | ICD-10-CM | POA: Diagnosis not present

## 2021-07-19 LAB — POCT GLYCOSYLATED HEMOGLOBIN (HGB A1C): Hemoglobin A1C: 6.3 % — AB (ref 4.0–5.6)

## 2021-07-19 MED ORDER — LISINOPRIL-HYDROCHLOROTHIAZIDE 20-25 MG PO TABS: 1.0000 | ORAL_TABLET | Freq: Every day | ORAL | 0 refills | Status: DC

## 2021-07-19 MED ORDER — GLIPIZIDE ER 10 MG PO TB24: 10.0000 mg | ORAL_TABLET | Freq: Every day | ORAL | 0 refills | Status: DC

## 2021-07-19 MED ORDER — FLUTICASONE-SALMETEROL 500-50 MCG/ACT IN AEPB: 1.0000 | INHALATION_SPRAY | Freq: Two times a day (BID) | RESPIRATORY_TRACT | 2 refills | Status: AC

## 2021-07-19 MED ORDER — FUROSEMIDE 20 MG PO TABS: ORAL_TABLET | ORAL | 2 refills | Status: DC

## 2021-07-19 MED ORDER — ALBUTEROL SULFATE HFA 108 (90 BASE) MCG/ACT IN AERS: 1.0000 | INHALATION_SPRAY | Freq: Four times a day (QID) | RESPIRATORY_TRACT | 0 refills | Status: AC | PRN

## 2021-07-19 MED ORDER — METFORMIN HCL 850 MG PO TABS: 850.0000 mg | ORAL_TABLET | Freq: Two times a day (BID) | ORAL | 0 refills | Status: DC

## 2021-07-19 NOTE — Progress Notes (Signed)
Established Patient Office Visit  Subjective    Patient ID: Caci Orren, female    DOB: 22-Apr-1965  Age: 56 y.o. MRN: [id number]  CC:  Chief Complaint  Patient presents with   Follow-up    HPI Georgetta Crafton presents to establish care with new provider and for follow up of chronic med issues including diabetes and hypertension. Patient also reports that she needs additional meds for her asthma particularly in this season.    Outpatient Encounter Medications as of 07/19/2021  Medication Sig   Blood Glucose Monitoring Suppl (TRUE METRIX METER) w/Device KIT Use to check blood sugar 3 times daily   fluticasone-salmeterol (ADVAIR) 500-50 MCG/ACT AEPB Inhale 1 puff into the lungs in the morning and at bedtime.   glucose blood (TRUE METRIX BLOOD GLUCOSE TEST) test strip Use to check blood sugar three times per day   HYDROcodone-acetaminophen (NORCO/VICODIN) 5-325 MG tablet Take 1-2 tablets by mouth every 6 (six) hours as needed.   ibuprofen (ADVIL) 600 MG tablet Take 1 tablet (600 mg total) by mouth every 8 (eight) hours as needed for moderate pain.   lisinopril-hydrochlorothiazide (ZESTORETIC) 20-25 MG tablet Take 1 tablet by mouth daily.   trimethoprim-polymyxin b (POLYTRIM) ophthalmic solution Place 1 drop into the left eye every 6 (six) hours.   TRUEPLUS LANCETS 28G MISC Use to check blood sugar 3 times daily   [DISCONTINUED] albuterol (VENTOLIN HFA) 108 (90 Base) MCG/ACT inhaler Inhale 1-2 puffs into the lungs every 6 (six) hours as needed for wheezing or shortness of breath.   [DISCONTINUED] furosemide (LASIX) 20 MG tablet As needed 1 daily prn for leg swelling   [DISCONTINUED] glipiZIDE (GLIPIZIDE XL) 10 MG 24 hr tablet Take 1 tablet (10 mg total) by mouth daily with breakfast. To lower blood sugar   [DISCONTINUED] lisinopril-hydrochlorothiazide (PRINZIDE,ZESTORETIC) 10-12.5 MG tablet Take 2 tablets by mouth daily. To lower blood pressure   [DISCONTINUED] metFORMIN (GLUCOPHAGE)  850 MG tablet Take 1 tablet (850 mg total) by mouth 2 (two) times daily with a meal.   albuterol (VENTOLIN HFA) 108 (90 Base) MCG/ACT inhaler Inhale 1-2 puffs into the lungs every 6 (six) hours as needed for wheezing or shortness of breath.   furosemide (LASIX) 20 MG tablet As needed 1 daily prn for leg swelling   glipiZIDE (GLIPIZIDE XL) 10 MG 24 hr tablet Take 1 tablet (10 mg total) by mouth daily with breakfast. To lower blood sugar   levofloxacin (LEVAQUIN) 750 MG tablet Take 1 tablet (750 mg total) by mouth daily for 5 days. (Patient not taking: Reported on 07/19/2021)   metFORMIN (GLUCOPHAGE) 850 MG tablet Take 1 tablet (850 mg total) by mouth 2 (two) times daily with a meal.   No facility-administered encounter medications on file as of 07/19/2021.    Past Medical History:  Diagnosis Date   Asthma    Child   Diabetes mellitus    Obesity     Past Surgical History:  Procedure Laterality Date   CHOLECYSTECTOMY     TUBAL LIGATION      Family History  Problem Relation Age of Onset   Diabetes Mother     Social History   Socioeconomic History   Marital status: Divorced    Spouse name: Not on file   Number of children: Not on file   Years of education: Not on file   Highest education level: Not on file  Occupational History   Not on file  Tobacco Use   Smoking status: Former  Packs/day: 0.00    Types: Cigarettes   Smokeless tobacco: Never   Tobacco comments:    Smoked when she was a teenager.   Vaping Use   Vaping Use: Never used  Substance and Sexual Activity   Alcohol use: Yes    Comment: occasionally   Drug use: No   Sexual activity: Yes  Other Topics Concern   Not on file  Social History Narrative   Lives with 3 children in [place]. Does not work    Scientist, physiological Strain: Not on file  Food Insecurity: Not on file  Transportation Needs: Not on file  Physical Activity: Not on file  Stress: Not on file  Social  Connections: Not on file  Intimate Partner Violence: Not on file    Review of Systems  Respiratory:  Positive for cough and wheezing. Negative for shortness of breath.   All other systems reviewed and are negative.      Objective    BP (!) 149/96   Pulse 84   Temp 98.1 F (36.7 C) (Oral)   Resp 16   Ht '5\' 6"'  (1.676 m)   Wt 260 lb (117.9 kg)   LMP 05/26/2017   SpO2 98%   BMI 41.97 kg/m   Physical Exam Vitals and nursing note reviewed.  Constitutional:      General: She is not in acute distress. Cardiovascular:     Rate and Rhythm: Normal rate and regular rhythm.  Pulmonary:     Effort: Pulmonary effort is normal.     Breath sounds: Normal breath sounds. No wheezing.  Abdominal:     Palpations: Abdomen is soft.     Tenderness: There is no abdominal tenderness.  Musculoskeletal:     Right lower leg: No edema.     Left lower leg: No edema.  Neurological:     General: No focal deficit present.     Mental Status: She is alert and oriented to person, place, and time.        Assessment & Plan:   1. Type 2 diabetes mellitus with chronic kidney disease, without long-term current use of insulin, unspecified CKD stage (HCC) Much improved A1c and at goal. Continue present management and monitor. Meds refilled.  - POCT glycosylated hemoglobin (Hb A1C)  2. Primary hypertension Elevated reading. Discussed compliance. Meds refilled continue and monitor  3. Moderate persistent reactive airway disease without complication Advair added to regimen. Will monitor  4. Edema of right foot Meds refilled.  - furosemide (LASIX) 20 MG tablet; As needed 1 daily prn for leg swelling  Dispense: 30 tablet; Refill: 2  Return in about 4 weeks (around 08/16/2021) for follow up.   Becky Sax, MD

## 2021-08-21 ENCOUNTER — Ambulatory Visit: Payer: Commercial Managed Care - HMO | Admitting: Family Medicine

## 2021-09-11 ENCOUNTER — Ambulatory Visit: Payer: Self-pay | Admitting: *Deleted

## 2021-09-11 DIAGNOSIS — R600 Localized edema: Secondary | ICD-10-CM

## 2021-09-11 MED ORDER — FUROSEMIDE 20 MG PO TABS: ORAL_TABLET | ORAL | 0 refills | Status: DC

## 2021-09-11 NOTE — Telephone Encounter (Signed)
Missed dialed first attempt, called pt back at correct number, left VM to call back to discuss symptoms.

## 2021-09-11 NOTE — Telephone Encounter (Signed)
Patient states she was prescribed a rx for swelling and misplace the medication   Patient states she now has swelling in her feet and legs   Attempted to reach pt, States call cannot be completed

## 2021-09-11 NOTE — Telephone Encounter (Signed)
  Chief Complaint: lost lasix, swelling and black spots Symptoms: swelling to thighs and hips and ankles, black spots (some look like blisters) Frequency: 2 weeks without Lasix Pertinent Negatives: Patient denies redness, painful to touch  Disposition: [] ED /[] Urgent Care (no appt availability in office) / [] Appointment(In office/virtual)/ []  [facility]/ [] Home Care/ [x] Refused Recommended Disposition /[] [facility]/ []  Follow-up with PCP Additional Notes: Advised pt that no openings in time frame and offered to help with information for mobile bus. Pt refused. Pt lost Lasix and needs refill (sent to pharmacy) No earlier appointments. Advised pt to call back for any signs of infection or increasing edema or spots  Reason for Disposition  Looks like a boil, infected sore, deep ulcer or other infected rash (spreading redness, pus)  Answer Assessment - Initial Assessment Questions 1. ONSET: "When did the swelling start?" (e.g., minutes, hours, days)     2 weeks 2. LOCATION: "What part of the leg is swollen?"  "Are both legs swollen or just one leg?"     Both legs  ankle and hip 3. SEVERITY: "How bad is the swelling?" (e.g., localized; mild, moderate, severe)   - Localized: Small area of swelling localized to one leg.   - MILD pedal edema: Swelling limited to foot and ankle, pitting edema < 1/4 inch (6 mm) deep, rest and elevation eliminate most or all swelling.   - MODERATE edema: Swelling of lower leg to knee, pitting edema > 1/4 inch (6 mm) deep, rest and elevation only partially reduce swelling.   - SEVERE edema: Swelling extends above knee, facial or hand swelling present.      severe 4. REDNESS: "Does the swelling look red or infected?"     no 5. PAIN: "Is the swelling painful to touch?" If Yes, ask: "How painful is it?"   (Scale 1-10; mild, moderate or severe)     no 6. FEVER: "Do you have a fever?" If Yes, ask: "What is it, how was it measured, and when did it  start?"      no 7. CAUSE: "What do you think is causing the leg swelling?"     diabetes 8. MEDICAL HISTORY: "Do you have a history of blood clots (e.g., DVT), cancer, heart failure, kidney disease, or liver failure?"     diabetes 9. RECURRENT SYMPTOM: "Have you had leg swelling before?" If Yes, ask: "When was the last time?" "What happened that time?"     Yes, 10. OTHER SYMPTOMS: "Do you have any other symptoms?" (e.g., chest pain, difficulty breathing)       Ankle swelling  Protocols used: Leg Swelling and Edema-A-AH

## 2021-09-11 NOTE — Telephone Encounter (Signed)
Pt scheduled w/Wilson MD on 07/19

## 2021-09-11 NOTE — Telephone Encounter (Signed)
Third attempt to reach patient- left message to call office

## 2021-09-12 ENCOUNTER — Ambulatory Visit: Payer: Commercial Managed Care - HMO | Admitting: Family Medicine

## 2021-09-24 ENCOUNTER — Ambulatory Visit: Payer: Commercial Managed Care - HMO | Admitting: Family

## 2021-10-11 ENCOUNTER — Ambulatory Visit (INDEPENDENT_AMBULATORY_CARE_PROVIDER_SITE_OTHER): Payer: Commercial Managed Care - HMO | Admitting: Family Medicine

## 2021-10-11 ENCOUNTER — Encounter: Payer: Self-pay | Admitting: Family Medicine

## 2021-10-11 VITALS — BP 121/76 | HR 89 | Temp 98.3°F | Resp 16 | Ht 65.98 in | Wt 270.0 lb

## 2021-10-11 DIAGNOSIS — L989 Disorder of the skin and subcutaneous tissue, unspecified: Secondary | ICD-10-CM | POA: Diagnosis not present

## 2021-10-11 DIAGNOSIS — E1122 Type 2 diabetes mellitus with diabetic chronic kidney disease: Secondary | ICD-10-CM

## 2021-10-11 DIAGNOSIS — Z6841 Body Mass Index (BMI) 40.0 and over, adult: Secondary | ICD-10-CM

## 2021-10-11 LAB — POCT GLYCOSYLATED HEMOGLOBIN (HGB A1C): Hemoglobin A1C: 6.4 % — AB (ref 4.0–5.6)

## 2021-10-11 NOTE — Progress Notes (Signed)
Established Patient Office Visit  Subjective    Patient ID: [name], female    DOB: 03-Apr-1965  Age: 56 y.o. MRN: [id number]  CC:  Chief Complaint  Patient presents with   Diabetes   Rash    HPI [name] presents for routine follow up of chronic med issues including hypertension. Patient reports that she has lesions that have developed on her forearms since she has started lasix. She has noticed them for about 3 weeks.    Outpatient Encounter Medications as of 10/11/2021  Medication Sig   albuterol (VENTOLIN HFA) 108 (90 Base) MCG/ACT inhaler Inhale 1-2 puffs into the lungs every 6 (six) hours as needed for wheezing or shortness of breath.   Blood Glucose Monitoring Suppl (TRUE METRIX METER) w/Device KIT Use to check blood sugar 3 times daily   fluticasone-salmeterol (ADVAIR) 500-50 MCG/ACT AEPB Inhale 1 puff into the lungs in the morning and at bedtime.   furosemide (LASIX) 20 MG tablet As needed 1 daily prn for leg swelling   glipiZIDE (GLIPIZIDE XL) 10 MG 24 hr tablet Take 1 tablet (10 mg total) by mouth daily with breakfast. To lower blood sugar   glucose blood (TRUE METRIX BLOOD GLUCOSE TEST) test strip Use to check blood sugar three times per day   ibuprofen (ADVIL) 600 MG tablet Take 1 tablet (600 mg total) by mouth every 8 (eight) hours as needed for moderate pain.   lisinopril-hydrochlorothiazide (ZESTORETIC) 20-25 MG tablet Take 1 tablet by mouth daily.   metFORMIN (GLUCOPHAGE) 850 MG tablet Take 1 tablet (850 mg total) by mouth 2 (two) times daily with a meal.   trimethoprim-polymyxin b (POLYTRIM) ophthalmic solution Place 1 drop into the left eye every 6 (six) hours.   TRUEPLUS LANCETS 28G MISC Use to check blood sugar 3 times daily   [DISCONTINUED] HYDROcodone-acetaminophen (NORCO/VICODIN) 5-325 MG tablet Take 1-2 tablets by mouth every 6 (six) hours as needed.   No facility-administered encounter medications on file as of 10/11/2021.    Past Medical  History:  Diagnosis Date   Asthma    Child   Diabetes mellitus    Obesity     Past Surgical History:  Procedure Laterality Date   CHOLECYSTECTOMY     TUBAL LIGATION      Family History  Problem Relation Age of Onset   Diabetes Mother     Social History   Socioeconomic History   Marital status: Divorced    Spouse name: Not on file   Number of children: Not on file   Years of education: Not on file   Highest education level: Not on file  Occupational History   Not on file  Tobacco Use   Smoking status: Former    Packs/day: 0.00    Types: Cigarettes   Smokeless tobacco: Never   Tobacco comments:    Smoked when she was a teenager.   Vaping Use   Vaping Use: Never used  Substance and Sexual Activity   Alcohol use: Yes    Comment: occasionally   Drug use: No   Sexual activity: Yes  Other Topics Concern   Not on file  Social History Narrative   Lives with 3 children in [place]. Does not work    Scientist, physiological Strain: Not on file  Food Insecurity: Not on file  Transportation Needs: Not on file  Physical Activity: Not on file  Stress: Not on file  Social Connections: Not on file  Intimate  Partner Violence: Not on file    Review of Systems  All other systems reviewed and are negative.       Objective    BP 121/76 (BP Location: Right Arm, Patient Position: Sitting, Cuff Size: Large)   Pulse 89   Temp 98.3 F (36.8 C)   Resp 16   Ht 5' 5.98" (1.676 m)   Wt 270 lb (122.5 kg)   LMP 05/26/2017   SpO2 98%   BMI 43.60 kg/m   Physical Exam Vitals and nursing note reviewed.  Constitutional:      General: She is not in acute distress. Cardiovascular:     Rate and Rhythm: Normal rate and regular rhythm.  Pulmonary:     Effort: Pulmonary effort is normal.     Breath sounds: Normal breath sounds. No wheezing.  Abdominal:     Palpations: Abdomen is soft.     Tenderness: There is no abdominal tenderness.   Musculoskeletal:     Right lower leg: No edema.     Left lower leg: No edema.  Skin:    Comments: Multiple hyperpigmented lesions noted on posterior surface of bilateral forearms  Neurological:     General: No focal deficit present.     Mental Status: She is alert and oriented to person, place, and time.         Assessment & Plan:   1. Type 2 diabetes mellitus with chronic kidney disease, without long-term current use of insulin, unspecified CKD stage (HCC) A1c is stable and at goal. Continue and monitor - POCT glycosylated hemoglobin (Hb A1C)  2. Skin lesions Will refer to derm for further eval/mgt  3. Class 3 severe obesity due to excess calories with serious comorbidity and body mass index (BMI) of 40.0 to 44.9 in adult [facility]) Discussed dietary and activity options continue and monitor    No follow-ups on file.   Becky Sax, MD

## 2021-10-11 NOTE — Progress Notes (Signed)
Pt presents for follow-up for diabetes management  -black bumps on back of arms

## 2021-10-17 ENCOUNTER — Telehealth: Payer: Self-pay | Admitting: Family Medicine

## 2021-10-17 NOTE — Telephone Encounter (Signed)
Patient called back again stating its urgent she speak to the provider as soon as possible. Please assist patient further

## 2021-10-17 NOTE — Telephone Encounter (Signed)
Copied from [place] [id number]. Topic: General - Other >> Oct 17, 2021  2:15 PM Leilani Able wrote: [phone] pt states it is urgent that Dr Redmond Pulling return the call, states very important pt  will not leave or reference  any detail to help prioritize [phone]

## 2021-10-17 NOTE — Telephone Encounter (Signed)
I have attempted without success to contact this patient by phone to return their call.

## 2021-10-18 ENCOUNTER — Encounter: Payer: Self-pay | Admitting: Family Medicine

## 2021-10-18 ENCOUNTER — Telehealth: Payer: Self-pay | Admitting: Family Medicine

## 2021-10-18 ENCOUNTER — Ambulatory Visit (INDEPENDENT_AMBULATORY_CARE_PROVIDER_SITE_OTHER): Payer: Commercial Managed Care - HMO | Admitting: Family Medicine

## 2021-10-18 VITALS — BP 145/96 | HR 82 | Temp 98.1°F | Resp 16 | Wt 270.0 lb

## 2021-10-18 DIAGNOSIS — N1831 Chronic kidney disease, stage 3a: Secondary | ICD-10-CM

## 2021-10-18 DIAGNOSIS — I890 Lymphedema, not elsewhere classified: Secondary | ICD-10-CM | POA: Diagnosis not present

## 2021-10-18 DIAGNOSIS — E1122 Type 2 diabetes mellitus with diabetic chronic kidney disease: Secondary | ICD-10-CM | POA: Diagnosis not present

## 2021-10-18 NOTE — Telephone Encounter (Signed)
Patient returned your call and is asking for you to call her back as soon as possible.

## 2021-10-19 ENCOUNTER — Encounter: Payer: Self-pay | Admitting: Family Medicine

## 2021-10-19 ENCOUNTER — Other Ambulatory Visit: Payer: Self-pay | Admitting: Family Medicine

## 2021-10-19 DIAGNOSIS — R600 Localized edema: Secondary | ICD-10-CM

## 2021-10-19 LAB — MICROALBUMIN / CREATININE URINE RATIO
Creatinine, Urine: 87 mg/dL
Microalb/Creat Ratio: 2871 mg/g creat — ABNORMAL HIGH (ref 0–29)
Microalbumin, Urine: 2498.1 ug/mL

## 2021-10-19 NOTE — Progress Notes (Signed)
Established Patient Office Visit  Subjective    Patient ID: [name], female    DOB: 1966/01/16  Age: 56 y.o. MRN: [id number]  CC: No chief complaint on file.   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    Outpatient Encounter Medications as of 10/18/2021  Medication Sig   albuterol (VENTOLIN HFA) 108 (90 Base) MCG/ACT inhaler Inhale 1-2 puffs into the lungs every 6 (six) hours as needed for wheezing or shortness of breath.   Blood Glucose Monitoring Suppl (TRUE METRIX METER) w/Device KIT Use to check blood sugar 3 times daily   fluticasone-salmeterol (ADVAIR) 500-50 MCG/ACT AEPB Inhale 1 puff into the lungs in the morning and at bedtime.   furosemide (LASIX) 20 MG tablet As needed 1 daily prn for leg swelling   glipiZIDE (GLIPIZIDE XL) 10 MG 24 hr tablet Take 1 tablet (10 mg total) by mouth daily with breakfast. To lower blood sugar   glucose blood (TRUE METRIX BLOOD GLUCOSE TEST) test strip Use to check blood sugar three times per day   ibuprofen (ADVIL) 600 MG tablet Take 1 tablet (600 mg total) by mouth every 8 (eight) hours as needed for moderate pain.   lisinopril-hydrochlorothiazide (ZESTORETIC) 20-25 MG tablet Take 1 tablet by mouth daily.   metFORMIN (GLUCOPHAGE) 850 MG tablet Take 1 tablet (850 mg total) by mouth 2 (two) times daily with a meal.   trimethoprim-polymyxin b (POLYTRIM) ophthalmic solution Place 1 drop into the left eye every 6 (six) hours.   TRUEPLUS LANCETS 28G MISC Use to check blood sugar 3 times daily   No facility-administered encounter medications on file as of 10/18/2021.    Past Medical History:  Diagnosis Date   Asthma    Child   Diabetes mellitus    Obesity     Past Surgical History:  Procedure Laterality Date   CHOLECYSTECTOMY     TUBAL LIGATION      Family History  Problem Relation Age of Onset   Diabetes Mother     Social  History   Socioeconomic History   Marital status: Divorced    Spouse name: Not on file   Number of children: Not on file   Years of education: Not on file   Highest education level: Not on file  Occupational History   Not on file  Tobacco Use   Smoking status: Former    Packs/day: 0.00    Types: Cigarettes   Smokeless tobacco: Never   Tobacco comments:    Smoked when she was a teenager.   Vaping Use   Vaping Use: Never used  Substance and Sexual Activity   Alcohol use: Yes    Comment: occasionally   Drug use: No   Sexual activity: Yes  Other Topics Concern   Not on file  Social History Narrative   Lives with 3 children in [place]. Does not work    Scientist, physiological Strain: Not on file  Food Insecurity: Not on file  Transportation Needs: Not on file  Physical Activity: Not on file  Stress: Not on file  Social Connections: Not on file  Intimate Partner Violence: Not on file    Review of Systems  All other systems reviewed and are negative.       Objective    BP (!) 145/96   Pulse 82   Temp 98.1 F (36.7 C) (  Oral)   Resp 16   Wt 270 lb (122.5 kg)   LMP 05/26/2017   SpO2 98%   BMI 43.60 kg/m   Physical Exam Vitals and nursing note reviewed.  Constitutional:      General: She is not in acute distress. Cardiovascular:     Rate and Rhythm: Normal rate and regular rhythm.  Pulmonary:     Effort: Pulmonary effort is normal.     Breath sounds: Normal breath sounds. No wheezing.  Abdominal:     Palpations: Abdomen is soft.     Tenderness: There is no abdominal tenderness.  Musculoskeletal:     Right lower leg: No edema.     Left lower leg: No edema.  Neurological:     General: No focal deficit present.     Mental Status: She is alert and oriented to person, place, and time.         Assessment & Plan:   1. Type 2 diabetes mellitus with stage 3a chronic kidney disease, without long-term current use of insulin  ([place]) Monitoring labs ordered - Microalbumin/Creatinine Ratio, Urine - Ambulatory referral to Nephrology - CMP14+EGFR  2. Lymphedema Referral to vascular for further eval/mgt - Ambulatory referral to Vascular Surgery    No follow-ups on file.   Becky Sax, MD

## 2021-10-22 ENCOUNTER — Encounter: Payer: Self-pay | Admitting: *Deleted

## 2021-10-22 ENCOUNTER — Other Ambulatory Visit: Payer: Self-pay | Admitting: Family Medicine

## 2021-10-23 ENCOUNTER — Telehealth: Payer: Self-pay | Admitting: Family Medicine

## 2021-10-23 NOTE — Telephone Encounter (Signed)
Came in asking about status of DOT medical form and if it is ready? Asking if [facility] from [facility] has come in?  [phone] Pt asked to be notified at this number to be updated. Thank you.

## 2021-10-23 NOTE — Telephone Encounter (Signed)
Patient was called . Patient phone is off and daughter will get patient to call back regarding her labs

## 2021-10-24 ENCOUNTER — Telehealth: Payer: Self-pay

## 2021-10-24 LAB — CMP14+EGFR
ALT: 8 IU/L (ref 0–32)
AST: 17 IU/L (ref 0–40)
Albumin/Globulin Ratio: 1.1 — ABNORMAL LOW (ref 1.2–2.2)
Albumin: 3.5 g/dL — ABNORMAL LOW (ref 3.8–4.9)
Alkaline Phosphatase: 92 IU/L (ref 44–121)
BUN/Creatinine Ratio: 7 — ABNORMAL LOW (ref 9–23)
BUN: 66 mg/dL — ABNORMAL HIGH (ref 6–24)
Bilirubin Total: 0.6 mg/dL (ref 0.0–1.2)
CO2: 10 mmol/L — ABNORMAL LOW (ref 20–29)
Calcium: 5.8 mg/dL — CL (ref 8.7–10.2)
Chloride: 111 mmol/L — ABNORMAL HIGH (ref 96–106)
Creatinine, Ser: 9.26 mg/dL — ABNORMAL HIGH (ref 0.57–1.00)
Globulin, Total: 3.2 g/dL (ref 1.5–4.5)
Glucose: 72 mg/dL (ref 70–99)
Potassium: 3.5 mmol/L (ref 3.5–5.2)
Sodium: 147 mmol/L — ABNORMAL HIGH (ref 134–144)
Total Protein: 6.7 g/dL (ref 6.0–8.5)
eGFR: 5 mL/min/{1.73_m2} — ABNORMAL LOW (ref >59)

## 2021-10-24 NOTE — Telephone Encounter (Signed)
I have attempted without success to contact this patient by phone to return their call and discuss lab results.Patient need to call office regarding labs

## 2021-10-24 NOTE — Telephone Encounter (Signed)
Ca 5.8 Critical result- called office and reported to Denyse Dago and [facility]. Lily RN read back result correctly.

## 2021-10-24 NOTE — Telephone Encounter (Signed)
Pt given lab results per notes of Dr. Redmond Pulling on 10/19/21. Pt verbalized understanding. She asked is the DOT form ready for pickup. I advised I will send this message and someone will call when it's ready. She says to call her mobile number listed in the chart [phone].    Natalie Mai, MD  10/19/2021  5:17 PM EDT     Severe urinary protein - referred to nephrology for further eval/mgt

## 2021-11-06 ENCOUNTER — Telehealth: Payer: Self-pay | Admitting: Family Medicine

## 2021-11-06 NOTE — Telephone Encounter (Signed)
[name] from  [place]  Dr  Jannifer Hick  calling   because patient need to re check labs  and if the labs don't improve from 8/31 patient need to go  to Emergency room . Please, call her  at  [phone] Ext [phone]

## 2021-11-12 ENCOUNTER — Other Ambulatory Visit: Payer: Self-pay | Admitting: Family Medicine

## 2021-11-12 ENCOUNTER — Other Ambulatory Visit: Payer: Self-pay | Admitting: *Deleted

## 2021-11-12 ENCOUNTER — Telehealth: Payer: Self-pay | Admitting: Family Medicine

## 2021-11-12 DIAGNOSIS — N19 Unspecified kidney failure: Secondary | ICD-10-CM

## 2021-11-12 NOTE — Telephone Encounter (Signed)
Patient has been called to come pick up lab paperwork to take with  her to [facility]

## 2021-11-12 NOTE — Telephone Encounter (Signed)
Trellis Moment  I sent  the same message to Dr Redmond Pulling last week  Thank you    Nira Conn from  [facility]  Dr  Jannifer Hick  calling   because patient need to re check labs  and if the labs don't improve from 8/31 patient need to go  to Emergency room . Please, call her  at  [phone] Ext [phone]

## 2021-12-06 ENCOUNTER — Emergency Department (HOSPITAL_COMMUNITY)
Admission: EM | Admit: 2021-12-06 | Discharge: 2021-12-06 | Disposition: A | Discharge status: Home / Self Care | Payer: Self-pay | Attending: Emergency Medicine | Admitting: Emergency Medicine

## 2021-12-06 ENCOUNTER — Other Ambulatory Visit: Payer: Self-pay

## 2021-12-06 ENCOUNTER — Encounter (HOSPITAL_COMMUNITY): Payer: Self-pay | Admitting: Emergency Medicine

## 2021-12-06 ENCOUNTER — Emergency Department (HOSPITAL_COMMUNITY): Payer: Self-pay

## 2021-12-06 DIAGNOSIS — Z7984 Long term (current) use of oral hypoglycemic drugs: Secondary | ICD-10-CM | POA: Insufficient documentation

## 2021-12-06 DIAGNOSIS — N179 Acute kidney failure, unspecified: Secondary | ICD-10-CM | POA: Insufficient documentation

## 2021-12-06 DIAGNOSIS — E119 Type 2 diabetes mellitus without complications: Secondary | ICD-10-CM | POA: Insufficient documentation

## 2021-12-06 DIAGNOSIS — N19 Unspecified kidney failure: Secondary | ICD-10-CM

## 2021-12-06 DIAGNOSIS — Z7951 Long term (current) use of inhaled steroids: Secondary | ICD-10-CM | POA: Insufficient documentation

## 2021-12-06 DIAGNOSIS — J45909 Unspecified asthma, uncomplicated: Secondary | ICD-10-CM | POA: Insufficient documentation

## 2021-12-06 DIAGNOSIS — R600 Localized edema: Secondary | ICD-10-CM | POA: Insufficient documentation

## 2021-12-06 LAB — MAGNESIUM: Magnesium: 1.2 mg/dL — ABNORMAL LOW (ref 1.7–2.4)

## 2021-12-06 LAB — CBC WITH DIFFERENTIAL/PLATELET
Abs Immature Granulocytes: 0.03 10*3/uL (ref 0.00–0.07)
Basophils Absolute: 0 10*3/uL (ref 0.0–0.1)
Basophils Relative: 1 %
Eosinophils Absolute: 0 10*3/uL (ref 0.0–0.5)
Eosinophils Relative: 0 %
HCT: 29.6 % — ABNORMAL LOW (ref 36.0–46.0)
Hemoglobin: 9.5 g/dL — ABNORMAL LOW (ref 12.0–15.0)
Immature Granulocytes: 1 %
Lymphocytes Relative: 11 %
Lymphs Abs: 0.7 10*3/uL (ref 0.7–4.0)
MCH: 29 pg (ref 26.0–34.0)
MCHC: 32.1 g/dL (ref 30.0–36.0)
MCV: 90.2 fL (ref 80.0–100.0)
Monocytes Absolute: 0.2 10*3/uL (ref 0.1–1.0)
Monocytes Relative: 3 %
Neutro Abs: 5.2 10*3/uL (ref 1.7–7.7)
Neutrophils Relative %: 84 %
Platelets: 246 10*3/uL (ref 150–400)
RBC: 3.28 MIL/uL — ABNORMAL LOW (ref 3.87–5.11)
RDW: 14.7 % (ref 11.5–15.5)
WBC: 6.2 10*3/uL (ref 4.0–10.5)
nRBC: 0 % (ref 0.0–0.2)

## 2021-12-06 LAB — COMPREHENSIVE METABOLIC PANEL
ALT: 10 U/L (ref 0–44)
AST: 20 U/L (ref 15–41)
Albumin: 3.3 g/dL — ABNORMAL LOW (ref 3.5–5.0)
Alkaline Phosphatase: 79 U/L (ref 38–126)
Anion gap: 16 — ABNORMAL HIGH (ref 5–15)
BUN: 52 mg/dL — ABNORMAL HIGH (ref 6–20)
CO2: 10 mmol/L — ABNORMAL LOW (ref 22–32)
Calcium: 5.5 mg/dL — CL (ref 8.9–10.3)
Chloride: 114 mmol/L — ABNORMAL HIGH (ref 98–111)
Creatinine, Ser: 10.98 mg/dL — ABNORMAL HIGH (ref 0.44–1.00)
GFR, Estimated: 4 mL/min — ABNORMAL LOW (ref >60)
Glucose, Bld: 108 mg/dL — ABNORMAL HIGH (ref 70–99)
Potassium: 2.7 mmol/L — CL (ref 3.5–5.1)
Sodium: 140 mmol/L (ref 135–145)
Total Bilirubin: 0.7 mg/dL (ref 0.3–1.2)
Total Protein: 7.7 g/dL (ref 6.5–8.1)

## 2021-12-06 LAB — URINALYSIS, ROUTINE W REFLEX MICROSCOPIC
Bilirubin Urine: NEGATIVE
Glucose, UA: 50 mg/dL — AB
Ketones, ur: NEGATIVE mg/dL
Leukocytes,Ua: NEGATIVE
Nitrite: NEGATIVE
Protein, ur: 100 mg/dL — AB
RBC / HPF: >50 RBC/hpf — ABNORMAL HIGH (ref 0–5)
Specific Gravity, Urine: 1.006 (ref 1.005–1.030)
pH: 5 (ref 5.0–8.0)

## 2021-12-06 LAB — I-STAT BETA HCG BLOOD, ED (MC, WL, AP ONLY): I-stat hCG, quantitative: 10.4 m[IU]/mL — ABNORMAL HIGH (ref <5)

## 2021-12-06 LAB — LIPASE, BLOOD: Lipase: 27 U/L (ref 11–51)

## 2021-12-06 LAB — CBG MONITORING, ED
Glucose-Capillary: 76 mg/dL (ref 70–99)
Glucose-Capillary: 99 mg/dL (ref 70–99)

## 2021-12-06 MED ORDER — ONDANSETRON HCL 4 MG/2ML IJ SOLN
4.0000 mg | Freq: Once | INTRAMUSCULAR | Status: DC
  Filled 2021-12-06: qty 2

## 2021-12-06 MED ORDER — MORPHINE SULFATE (PF) 4 MG/ML IV SOLN
4.0000 mg | Freq: Once | INTRAVENOUS | Status: DC
  Filled 2021-12-06: qty 1

## 2021-12-06 NOTE — ED Triage Notes (Signed)
Pt arrives via EMS for right lower abd pain that started this morning. Pt having some nausea/vomiting. BP 180/110, CBG 110

## 2021-12-06 NOTE — ED Provider Notes (Signed)
[facility] EMERGENCY DEPARTMENT Provider Note   CSN: [id number] Arrival date & time: 12/06/21  1141     History  Chief Complaint  Patient presents with   Abdominal Pain    [name] is a 56 y.o. female.  Patient is a 56 year old female with a history of diabetes, asthma and prior cholecystectomy and tubal ligation who is presenting today with severe right-sided abdominal pain, nausea and vomiting.  Patient reports the abdominal pain started yesterday and is in the right side.  It has gradually worsened since yesterday and today became severe.  She has not been able to eat or drink anything.  She feels generally unwell.  Patient reports for months now she has had loose stools but denies any new diarrhea.  She has not had fever and reports she has had pain like this occasionally in the past but it has never been this severe and never lasted this long.  She does have prior history of kidney stones and reports she had surgery done years ago but nothing recent.  She denies any chest pain or shortness of breath.  she has not had cough.  She denies any dysuria, frequency or change in urinary volume.  Patient per medical record was seen by her doctor at the end of August and at that time had blood work done that showed significant creatinine of over 9.  She reports she did get more blood work done but thought everything was okay.  She does not see a renal specialist.  She has not had any recent change in her medications.  The history is provided by the patient.  Abdominal Pain      Home Medications Prior to Admission medications   Medication Sig Start Date End Date Taking? Authorizing Provider  albuterol (VENTOLIN HFA) 108 (90 Base) MCG/ACT inhaler Inhale 1-2 puffs into the lungs every 6 (six) hours as needed for wheezing or shortness of breath. 07/19/21   [name], MD  Blood Glucose Monitoring Suppl (TRUE METRIX METER) w/Device KIT Use to check blood sugar 3 times  daily 11/03/17   [name], [name], MD  fluticasone-salmeterol (ADVAIR) 500-50 MCG/ACT AEPB Inhale 1 puff into the lungs in the morning and at bedtime. 07/19/21   [name], MD  furosemide (LASIX) 20 MG tablet As needed 1 daily prn for leg swelling 09/11/21   [name], MD  glipiZIDE (GLIPIZIDE XL) 10 MG 24 hr tablet Take 1 tablet (10 mg total) by mouth daily with breakfast. To lower blood sugar 07/19/21   [name], MD  glucose blood (TRUE METRIX BLOOD GLUCOSE TEST) test strip Use to check blood sugar three times per day 11/03/17   [name], [name], MD  ibuprofen (ADVIL) 600 MG tablet Take 1 tablet (600 mg total) by mouth every 8 (eight) hours as needed for moderate pain. 02/06/20   [name], NP  lisinopril-hydrochlorothiazide (ZESTORETIC) 20-25 MG tablet Take 1 tablet by mouth daily. 07/19/21   [name], MD  metFORMIN (GLUCOPHAGE) 850 MG tablet Take 1 tablet (850 mg total) by mouth 2 (two) times daily with a meal. 07/19/21   [name], MD  trimethoprim-polymyxin b (POLYTRIM) ophthalmic solution Place 1 drop into the left eye every 6 (six) hours. 02/22/21   [name], MD  TRUEPLUS LANCETS 28G MISC Use to check blood sugar 3 times daily 11/03/17   [name], MD      Allergies    Penicillins    Review of Systems   Review  of Systems  Gastrointestinal:  Positive for abdominal pain.    Physical Exam Updated Vital Signs BP (!) 156/92   Pulse 82   Temp 98.1 F (36.7 C)   Resp 20   LMP 05/26/2017   SpO2 100%  Physical Exam Vitals and nursing note reviewed.  Constitutional:      General: She is not in acute distress.    Appearance: She is well-developed. She is ill-appearing.  HENT:     Head: Normocephalic and atraumatic.     Mouth/Throat:     Comments: Poor dentition Eyes:     Pupils: Pupils are equal, round, and reactive to light.  Cardiovascular:     Rate and Rhythm: Normal rate and regular rhythm.     Heart sounds: Normal heart sounds. No murmur heard.     No friction rub.  Pulmonary:     Effort: Pulmonary effort is normal.     Breath sounds: Normal breath sounds. No wheezing or rales.  Abdominal:     General: Bowel sounds are normal. There is no distension.     Palpations: Abdomen is soft.     Tenderness: There is no abdominal tenderness. There is no guarding or rebound.     Comments: With distraction patient has no rebound or guarding in the abdomen.  Does not appear to have flank pain  Musculoskeletal:        General: No tenderness. Normal range of motion.     Right lower leg: Edema present.     Left lower leg: Edema present.     Comments: Edema present in bilateral lower extremities also with skin changes consistent with chronic venous stasis  Skin:    General: Skin is warm and dry.     Findings: No rash.  Neurological:     Mental Status: She is alert and oriented to person, place, and time.     Cranial Nerves: No cranial nerve deficit.  Psychiatric:        Behavior: Behavior normal.     ED Results / Procedures / Treatments   Labs (all labs ordered are listed, but only abnormal results are displayed) Labs Reviewed  CBC WITH DIFFERENTIAL/PLATELET - Abnormal; Notable for the following components:      Result Value   RBC 3.28 (*)    Hemoglobin 9.5 (*)    HCT 29.6 (*)    All other components within normal limits  COMPREHENSIVE METABOLIC PANEL - Abnormal; Notable for the following components:   Potassium 2.7 (*)    Chloride 114 (*)    CO2 10 (*)    Glucose, Bld 108 (*)    BUN 52 (*)    Creatinine, Ser 10.98 (*)    Calcium 5.5 (*)    Albumin 3.3 (*)    GFR, Estimated 4 (*)    Anion gap 16 (*)    All other components within normal limits  URINALYSIS, ROUTINE W REFLEX MICROSCOPIC - Abnormal; Notable for the following components:   APPearance HAZY (*)    Glucose, UA 50 (*)    Hgb urine dipstick LARGE (*)    Protein, ur 100 (*)    RBC / HPF >50 (*)    Bacteria, UA RARE (*)    All other components within normal limits   MAGNESIUM - Abnormal; Notable for the following components:   Magnesium 1.2 (*)    All other components within normal limits  I-STAT BETA HCG BLOOD, ED ([facility], [facility], [facility] ONLY) - Abnormal; Notable for the following  components:   I-stat hCG, quantitative 10.4 (*)    All other components within normal limits  LIPASE, BLOOD  CBG MONITORING, ED  CBG MONITORING, ED    EKG None  Radiology CT ABDOMEN PELVIS WO CONTRAST  Result Date: 12/06/2021 CLINICAL DATA:  A right lower quadrant abdominal pain. EXAM: CT ABDOMEN AND PELVIS WITHOUT CONTRAST TECHNIQUE: Multidetector CT imaging of the abdomen and pelvis was performed following the standard protocol without IV contrast. RADIATION DOSE REDUCTION: This exam was performed according to the departmental dose-optimization program which includes automated exposure control, adjustment of the mA and/or kV according to patient size and/or use of iterative reconstruction technique. COMPARISON:  CT stone study 03/04/2015 FINDINGS: Lower chest: Right base collapse/consolidation is associated with at least moderate right pleural effusion. Hepatobiliary: No suspicious focal abnormality in the liver on this study without intravenous contrast. Gallbladder is surgically absent. No intrahepatic or extrahepatic biliary dilation. Pancreas: No focal mass lesion. No dilatation of the main duct. No intraparenchymal cyst. No peripancreatic edema. Spleen: No splenomegaly. No focal mass lesion. Adrenals/Urinary Tract: No adrenal nodule or mass. Vascular calcification noted in the hilum of each kidney with probable 2 mm nonobstructing stone lower pole right kidney. No evidence for hydroureter. The urinary bladder appears normal for the degree of distention. Stomach/Bowel: Stomach is unremarkable. No gastric wall thickening. No evidence of outlet obstruction. Duodenum is normally positioned as is the ligament of Treitz. No small bowel wall thickening. No small bowel dilatation. The terminal  ileum is normal. The appendix is normal. No gross colonic mass. Prominent wall thickness noted in the rectum although this is not well evaluated on noncontrast CT imaging. Vascular/Lymphatic: There is mild atherosclerotic calcification of the abdominal aorta without aneurysm. There is no gastrohepatic or hepatoduodenal ligament lymphadenopathy. No retroperitoneal or mesenteric lymphadenopathy. No pelvic sidewall lymphadenopathy. Reproductive: Unremarkable. Other: No intraperitoneal free fluid. Musculoskeletal: Diffuse body wall edema noted. No worrisome lytic or sclerotic osseous abnormality. IMPRESSION: 1. No acute findings in the abdomen or pelvis. Specifically, no findings to explain the patient's history of right lower quadrant pain. 2. Right base collapse/consolidation with at least moderate right pleural effusion. 3. Diffuse body wall edema. 4. Probable 2 mm nonobstructing stone lower pole right kidney. 5. Aortic Atherosclerosis (ICD10-I70.0). Electronically Signed   By: Misty Stanley M.D.   On: 12/06/2021 20:00    Procedures Procedures    Medications Ordered in ED Medications  ondansetron (ZOFRAN) injection 4 mg (has no administration in time range)  morphine (PF) 4 MG/ML injection 4 mg (has no administration in time range)    ED Course/ Medical Decision Making/ A&P                           Medical Decision Making Amount and/or Complexity of Data Reviewed External Data Reviewed: notes.    Details: PCP Labs: ordered. Decision-making details documented in ED Course. Radiology: ordered and independent interpretation performed. Decision-making details documented in ED Course. ECG/medicine tests: ordered and independent interpretation performed. Decision-making details documented in ED Course.  Risk Prescription drug management.   Pt with multiple medical problems and comorbidities and presenting today with a complaint that caries a high risk for morbidity and mortality.  Here today  with acute abdominal pain that is been present over the last 24 hours.  Patient describes a severe pain but does not have specific point tenderness with distraction on exam.  She denies any infectious symptoms.  She does have prior history  of kidney stone as well as cholecystectomy and tubal ligation.  Concern for appendicitis versus renal pathology like kidney stone versus pyelonephritis.  I independently interpreted patient's labs today and patient has a CBC with a normal white count and hemoglobin of 9.5 which is unchanged from August, normal platelet count, CMP today shows hypokalemia with a potassium of 2.7, renal failure with a creatinine of 10.98 with a BUN of 52 and a potassium of 5.5 with an anion gap of 16 and lipase within normal limits.  Will need a noncontrasted CT for further evaluation and she was given pain control.  Patient's renal function is very concerning today.  May be renal failure from diabetes and chronic hypertension could be obstructive pathology.  Will wait for CT to come back but patient reports she has not followed up with her renal specialist.  8:22 PM I have independently visualized and interpreted pt's images today.  CT today without evidence of obstructing renal stones or hydronephrosis.  Radiology reports no acute findings but she does have a right base consolidation most likely moderate right-sided pleural effusion in the right lung, diffuse body wall edema and a 2 mm nonobstructing stone in the lower pole of the right kidney.  Findings were discussed with the patient.  Stressed to her the severity of her renal function and the need for urgent nephrology follow-up.  Patient reports she does not wish to stay in the hospital at this time and she understands her kidneys are bad and that her symptoms could get worse but she would like to leave today.  Encouraged her to call her doctor tomorrow if she would need to be taken off of her ACE inhibitor and again would need urgent  follow-up with nephrology.  Also discussed with the patient if she starts feeling worse she could always return to the emergency room and be admitted at that time.  Patient is understanding of her options.  She still wishes to go home and understands that there is a risk associated with that.          Final Clinical Impression(s) / ED Diagnoses Final diagnoses:  Renal failure, unspecified chronicity    Rx / DC Orders ED Discharge Orders     None         Blanchie Dessert, MD 12/06/21 2022

## 2021-12-06 NOTE — ED Provider Triage Note (Signed)
Emergency Medicine Provider Triage Evaluation Note  [name] , a 56 y.o. female  was evaluated in triage.  Pt complains of right lower abdominal pain.  Started yesterday, has gradually worsened.  With some nausea, vomiting, and loose stools.  Denies blood in stools or constipation.  Vomiting nonbloody.  Denies possibility of pregnancy, fevers, chills, or recent known sick contacts.  Has never had pain like this before.  Unsure what makes it worse.  Review of Systems  Positive:  Negative: See above  Physical Exam  BP (!) 205/104 (BP Location: Right Arm)   Pulse 83   Temp 98 F (36.7 C) (Oral)   Resp (!) 24   LMP 05/26/2017   SpO2 98%  Gen:   Awake, no distress, appears uncomfortable, sitting and leaning forward holding stomach Resp:  Normal effort, not tachypneic on exam with respiration rate of 18 MSK:   Moves extremities without difficulty  Other:  No McBurney's point tenderness.  Right lower abdominal pain closer to the mid axillary line.  Abdomen soft, protuberant.  No obvious or pulsating mass appreciated.  Medical Decision Making  Medically screening exam initiated at 1:04 PM.  Appropriate orders placed.  Reyanne Hussar was informed that the remainder of the evaluation will be completed by another provider, this initial triage assessment does not replace that evaluation, and the importance of remaining in the ED until their evaluation is complete.     Prince Rome, [name] [id number]

## 2021-12-06 NOTE — ED Notes (Signed)
Attempted to place IV in pts RAC, was not successful. Ultrasound IV process started, as soon as we were about to place IV pt yelled for [place] to stop and that she needed to go to the bathroom. Pt refused to let us continue.

## 2021-12-06 NOTE — Discharge Instructions (Addendum)
No signs of kidney stones or appendicitis today.  However you do have fluid in the right lung and your kidneys are not working well at all.  This may be causing the pain you experienced earlier today.  If you start feeling worse have trouble breathing or have other concerns please return to the emergency room

## 2021-12-06 NOTE — ED Notes (Signed)
MD had long discussion with pt regarding care and risk/benefit of discharging versus admitting and treating. Pt preferring to seek outpatient care at this time. Discharge paper reviewed with patient.

## 2021-12-06 NOTE — ED Notes (Signed)
Received critical lab from lab.

## 2021-12-06 NOTE — ED Notes (Signed)
Pt transported to CT ?

## 2021-12-06 NOTE — ED Notes (Signed)
Pt returned from CT °

## 2021-12-11 NOTE — Telephone Encounter (Signed)
Patient has  been called and given appt

## 2021-12-15 ENCOUNTER — Other Ambulatory Visit: Payer: Self-pay

## 2021-12-15 ENCOUNTER — Ambulatory Visit (INDEPENDENT_AMBULATORY_CARE_PROVIDER_SITE_OTHER): Payer: Self-pay

## 2021-12-15 ENCOUNTER — Ambulatory Visit (HOSPITAL_COMMUNITY)
Admission: EM | Admit: 2021-12-15 | Discharge: 2021-12-15 | Disposition: A | Discharge status: Home / Self Care | Payer: Self-pay | Attending: Physician Assistant | Admitting: Physician Assistant

## 2021-12-15 ENCOUNTER — Encounter (HOSPITAL_COMMUNITY): Payer: Self-pay | Admitting: *Deleted

## 2021-12-15 DIAGNOSIS — E11628 Type 2 diabetes mellitus with other skin complications: Secondary | ICD-10-CM | POA: Insufficient documentation

## 2021-12-15 DIAGNOSIS — M79671 Pain in right foot: Secondary | ICD-10-CM

## 2021-12-15 DIAGNOSIS — L089 Local infection of the skin and subcutaneous tissue, unspecified: Secondary | ICD-10-CM | POA: Insufficient documentation

## 2021-12-15 MED ORDER — CLINDAMYCIN HCL 300 MG PO CAPS: 300.0000 mg | ORAL_CAPSULE | Freq: Three times a day (TID) | ORAL | 0 refills | Status: DC

## 2021-12-15 NOTE — ED Notes (Signed)
Pt refuses blood draw. Provider notified of Pt refusal of blood work

## 2021-12-15 NOTE — ED Triage Notes (Signed)
T reports she has had a gash on the bottom of Rt foot 2 weeks. Pt does not know what happened .

## 2021-12-15 NOTE — ED Provider Notes (Signed)
[facility]    CSN: [id number] Arrival date & time: 12/15/21  1510      History   Chief Complaint Chief Complaint  Patient presents with   Foot Injury    HPI [name] is a 56 y.o. female.   Patient presents today with a several week history of wound to her right foot.  She reports that she believes she stepped on something that went through her shoe and injured her foot.  She had been cleaning this and thought that it was improving only to have it worsened in the past week.  She does have a history of diabetes but reports that her blood sugars have been adequately controlled.  Pain is rated 8 on a 0-10 pain scale, described as sharp, worse with palpation or attempted ambulation, no leaving factors identified.  She has been using a cane to help with ambulation.  She has tried over-the-counter medication for symptom management.  She denies any recent antibiotics.  She is not currently followed by podiatrist.  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 ([place]) 08/12/2015   DM (diabetes mellitus), type 2 ([place]) 08/12/2015   HTN (hypertension) 08/12/2015   Dental cavities 05/10/2015   Sepsis ([place]) 03/04/2015   Hyponatremia 03/04/2015   Hypokalemia 03/04/2015   DKA (diabetic ketoacidoses) 03/04/2015   Diabetes mellitus with hyperglycemia, without long-term current use of insulin ([place]) 03/03/2015   Pyelonephritis 03/03/2015   AKI (acute kidney injury) ([place]) 03/03/2015   Upper respiratory disease 03/08/2014   Tooth abscess 03/08/2014   Chest pain 06/25/2011    Past Surgical History:  Procedure Laterality Date    CHOLECYSTECTOMY     TUBAL LIGATION      OB History   No obstetric history on file.      Home Medications    Prior to Admission medications   Medication Sig Start Date End Date Taking? Authorizing Provider  clindamycin (CLEOCIN) 300 MG capsule Take 1 capsule (300 mg total) by mouth 3 (three) times daily. 12/15/21  Yes Harlie Buening K, [name]  albuterol (VENTOLIN HFA) 108 (90 Base) MCG/ACT inhaler Inhale 1-2 puffs into the lungs every 6 (six) hours as needed for wheezing or shortness of breath. 07/19/21   [name], MD  Blood Glucose Monitoring Suppl (TRUE METRIX METER) w/Device KIT Use to check blood sugar 3 times daily 11/03/17   [name], [name], MD  fluticasone-salmeterol (ADVAIR) 500-50 MCG/ACT AEPB Inhale 1 puff into the lungs in the morning and at bedtime. 07/19/21   [name], MD  furosemide (LASIX) 20 MG tablet As needed 1 daily prn for leg swelling 09/11/21   [name], MD  glipiZIDE (GLIPIZIDE XL) 10 MG 24 hr tablet Take 1 tablet (10 mg total) by mouth daily with breakfast. To lower blood sugar 07/19/21   [name], MD  glucose blood (TRUE METRIX BLOOD GLUCOSE TEST) test strip Use to check blood sugar three times per day 11/03/17   [name], [name], MD  ibuprofen (ADVIL) 600 MG tablet Take 1 tablet (600 mg total) by mouth every 8 (eight) hours as needed  for moderate pain. 02/06/20   [name], NP  lisinopril-hydrochlorothiazide (ZESTORETIC) 20-25 MG tablet Take 1 tablet by mouth daily. 07/19/21   [name], MD  metFORMIN (GLUCOPHAGE) 850 MG tablet Take 1 tablet (850 mg total) by mouth 2 (two) times daily with a meal. 07/19/21   [name], MD  trimethoprim-polymyxin b (POLYTRIM) ophthalmic solution Place 1 drop into the left eye every 6 (six) hours. 02/22/21   [name], MD  TRUEPLUS LANCETS 28G MISC Use to check blood sugar 3 times daily 11/03/17   [name], MD    Family History Family History  Problem Relation Age of Onset   Diabetes Mother      Social History Social History   Tobacco Use   Smoking status: Former    Packs/day: 0.00    Types: Cigarettes   Smokeless tobacco: Never   Tobacco comments:    Smoked when she was a teenager.   Vaping Use   Vaping Use: Never used  Substance Use Topics   Alcohol use: Yes    Comment: occasionally   Drug use: No     Allergies   Penicillins   Review of Systems Review of Systems  Constitutional:  Positive for activity change. Negative for appetite change, fatigue and fever.  Gastrointestinal:  Negative for abdominal pain, diarrhea, nausea and vomiting.  Musculoskeletal:  Positive for arthralgias, gait problem and joint swelling. Negative for myalgias.  Skin:  Positive for wound. Negative for color change.  Neurological:  Negative for weakness and numbness.     Physical Exam Triage Vital Signs ED Triage Vitals  Enc Vitals Group     BP 12/15/21 1536 (!) 161/92     Pulse Rate 12/15/21 1536 82     Resp 12/15/21 1536 20     Temp 12/15/21 1536 98.2 F (36.8 C)     Temp src --      SpO2 12/15/21 1536 96 %     Weight --      Height --      Head Circumference --      Peak Flow --      Pain Score 12/15/21 1533 8     Pain Loc --      Pain Edu? --      Excl. in [place]? --    No data found.  Updated Vital Signs BP (!) 161/92   Pulse 82   Temp 98.2 F (36.8 C)   Resp 20   LMP 05/26/2017   SpO2 96%   Visual Acuity Right Eye Distance:   Left Eye Distance:   Bilateral Distance:    Right Eye Near:   Left Eye Near:    Bilateral Near:     Physical Exam Vitals reviewed.  Constitutional:      General: She is awake. She is not in acute distress.    Appearance: Normal appearance. She is well-developed. She is not ill-appearing.     Comments: Very pleasant female appears stated age sitting in wheelchair in exam room  HENT:     Head: Normocephalic and atraumatic.     Mouth/Throat:     Pharynx: Uvula midline. No oropharyngeal exudate or posterior oropharyngeal  erythema.  Cardiovascular:     Rate and Rhythm: Normal rate and regular rhythm.     Heart sounds: Normal heart sounds, S1 normal and S2 normal. No murmur heard.    Comments: Significant nonpitting edema noted right foot.  Capillary fill within 3 seconds Pulmonary:  Effort: Pulmonary effort is normal.     Breath sounds: Normal breath sounds. No wheezing, rhonchi or rales.     Comments: Clear to auscultation bilaterally Abdominal:     General: Bowel sounds are normal.     Palpations: Abdomen is soft.     Tenderness: There is no abdominal tenderness. There is no right CVA tenderness, left CVA tenderness, guarding or rebound.  Musculoskeletal:     Comments: Right foot neurovascularly intact.  Feet:     Right foot:     Skin integrity: Ulcer, skin breakdown and erythema present.     Toenail Condition: Right toenails are abnormally thick.     Comments: Approximately 6 cm x 3 cm ulceration noted plantar/lateral right foot with malodorous drainage. Psychiatric:        Behavior: Behavior is cooperative.      UC Treatments / Results  Labs (all labs ordered are listed, but only abnormal results are displayed) Labs Reviewed  AEROBIC CULTURE W GRAM STAIN (SUPERFICIAL SPECIMEN)    EKG   Radiology DG Foot Complete Right  Result Date: 12/15/2021 CLINICAL DATA:  Right foot pain and swelling with wound. EXAM: RIGHT FOOT COMPLETE - 3+ VIEW COMPARISON:  November 05, 2017.  March 11, 2018. FINDINGS: There is again noted extensive bony destruction and fragmentation involving the midfoot most consistent with neuropathic arthropathy. Vascular calcifications are noted. No definite acute fracture is noted. No definite lytic destruction is seen. Gas is seen in the plantar soft tissues consistent with ulceration or wound. 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 Collection of gas is seen in the plantar soft tissues consistent with  ulceration or wound. No definite lytic destruction or acute fracture is noted. Electronically Signed   By: Marijo Conception M.D.   On: 12/15/2021 16:24    Procedures Procedures (including critical care time)  Medications Ordered in UC Medications - No data to display  Initial Impression / Assessment and Plan / UC Course  I have reviewed the triage vital signs and the nursing notes.  Pertinent labs & imaging results that were available during my care of the patient were reviewed by me and considered in my medical decision making (see chart for details).     Patient is well-appearing, afebrile, nontoxic, nontachycardic.  I am very concerned about severity of infection.  X-ray was obtained that did show gas consistent with wound but did not show osteomyelitis.  I discussed that the safest thing to do would be to go to the emergency room as patient would likely benefit from IV antibiotics but she declined to do this today.  Culture was obtained-results pending.  Will cover with clindamycin.  Ordered CBC and CMP but patient ultimately declined to have this done.  Discussed that I am not comfortable starting Levaquin/ciprofloxacin without any more recent kidney function as her last 1 was significantly abnormal with creatinine of 10.  Patient reports that this is since resolved and she is creating urine but declined to have testing to monitor kidney function.  We discussed that ultimately she will need to see a podiatrist soon as possible.  Urgent referral was placed in epic.  She was instructed to call and schedule appointment to try foot and ankle first thing tomorrow.  Discussed that if she has any changing symptoms that she should go to the emergency room immediately.  Strict return precautions given.  Work excuse note provided.  Final Clinical Impressions(s) / UC Diagnoses   Final  diagnoses:  Diabetic foot infection [facility])     Discharge Instructions      As we discussed, I think the safest  thing to do is to go to the emergency room.  Since you do not want to do that today I do think we need to start antibiotics.  Please start clindamycin 3 times a day.  I will contact you once I have your lab work to see determine if we can start additional antibiotics.  Try to keep off your foot and keep it clean.  Follow-up with podiatry first thing Monday.  Call them to schedule an appointment.  I have also placed a referral.  If you have increasing pain, change in your drainage, fever, nausea, vomiting you need to be seen immediately.     ED Prescriptions     Medication Sig Dispense Auth. Provider   clindamycin (CLEOCIN) 300 MG capsule Take 1 capsule (300 mg total) by mouth 3 (three) times daily. 30 capsule Artist Bloom K, [name]      PDMP not reviewed this encounter.   Terrilee Croak, [name] 12/15/21 1721

## 2021-12-15 NOTE — Discharge Instructions (Signed)
As we discussed, I think the safest thing to do is to go to the emergency room.  Since you do not want to do that today I do think we need to start antibiotics.  Please start clindamycin 3 times a day.  I will contact you once I have your lab work to see determine if we can start additional antibiotics.  Try to keep off your foot and keep it clean.  Follow-up with podiatry first thing Monday.  Call them to schedule an appointment.  I have also placed a referral.  If you have increasing pain, change in your drainage, fever, nausea, vomiting you need to be seen immediately.

## 2021-12-15 NOTE — ED Notes (Signed)
PT's wound on [place] side of RT foot has brown drainage and Putrid odor from foot.

## 2021-12-19 ENCOUNTER — Telehealth (HOSPITAL_COMMUNITY): Payer: Self-pay | Admitting: Emergency Medicine

## 2021-12-19 LAB — AEROBIC CULTURE W GRAM STAIN (SUPERFICIAL SPECIMEN): Gram Stain: NONE SEEN

## 2021-12-19 MED ORDER — DOXYCYCLINE HYCLATE 100 MG PO CAPS: 100.0000 mg | ORAL_CAPSULE | Freq: Two times a day (BID) | ORAL | 0 refills | Status: AC

## 2021-12-21 ENCOUNTER — Ambulatory Visit (INDEPENDENT_AMBULATORY_CARE_PROVIDER_SITE_OTHER): Payer: Self-pay | Admitting: Family Medicine

## 2021-12-21 ENCOUNTER — Encounter: Payer: Self-pay | Admitting: Family Medicine

## 2021-12-21 VITALS — BP 150/90 | HR 75 | Temp 98.1°F | Resp 16 | Ht 66.54 in | Wt 238.4 lb

## 2021-12-21 DIAGNOSIS — N184 Chronic kidney disease, stage 4 (severe): Secondary | ICD-10-CM

## 2021-12-21 DIAGNOSIS — E1122 Type 2 diabetes mellitus with diabetic chronic kidney disease: Secondary | ICD-10-CM

## 2021-12-21 LAB — POCT GLYCOSYLATED HEMOGLOBIN (HGB A1C): Hemoglobin A1C: 6 % — AB (ref 4.0–5.6)

## 2021-12-21 NOTE — Progress Notes (Unsigned)
Established Patient Office Visit  Subjective    Patient ID: [name], female    DOB: [birth year]  Age: 56 y.o. MRN: [id number]  CC:  Chief Complaint  Patient presents with   Follow-up    HFU   Diabetes    HPI [name] presents for follow up of diabetes.She also desires completion of FMLA paperwork.    Outpatient Encounter Medications as of 12/21/2021  Medication Sig   albuterol (VENTOLIN HFA) 108 (90 Base) MCG/ACT inhaler Inhale 1-2 puffs into the lungs every 6 (six) hours as needed for wheezing or shortness of breath.   Blood Glucose Monitoring Suppl (TRUE METRIX METER) w/Device KIT Use to check blood sugar 3 times daily   clindamycin (CLEOCIN) 300 MG capsule Take 1 capsule (300 mg total) by mouth 3 (three) times daily.   doxycycline (VIBRAMYCIN) 100 MG capsule Take 1 capsule (100 mg total) by mouth 2 (two) times daily for 10 days.   fluticasone-salmeterol (ADVAIR) 500-50 MCG/ACT AEPB Inhale 1 puff into the lungs in the morning and at bedtime.   furosemide (LASIX) 20 MG tablet As needed 1 daily prn for leg swelling   glipiZIDE (GLIPIZIDE XL) 10 MG 24 hr tablet Take 1 tablet (10 mg total) by mouth daily with breakfast. To lower blood sugar   glucose blood (TRUE METRIX BLOOD GLUCOSE TEST) test strip Use to check blood sugar three times per day   ibuprofen (ADVIL) 600 MG tablet Take 1 tablet (600 mg total) by mouth every 8 (eight) hours as needed for moderate pain.   lisinopril-hydrochlorothiazide (ZESTORETIC) 20-25 MG tablet Take 1 tablet by mouth daily.   metFORMIN (GLUCOPHAGE) 850 MG tablet Take 1 tablet (850 mg total) by mouth 2 (two) times daily with a meal.   trimethoprim-polymyxin b (POLYTRIM) ophthalmic solution Place 1 drop into the left eye every 6 (six) hours.   TRUEPLUS LANCETS 28G MISC Use to check blood sugar 3 times daily   No facility-administered encounter medications on file as of 12/21/2021.    Past Medical History:  Diagnosis Date   Asthma     Child   Diabetes mellitus    Obesity     Past Surgical History:  Procedure Laterality Date   CHOLECYSTECTOMY     TUBAL LIGATION      Family History  Problem Relation Age of Onset   Diabetes Mother     Social History   Socioeconomic History   Marital status: Divorced    Spouse name: Not on file   Number of children: Not on file   Years of education: Not on file   Highest education level: Not on file  Occupational History   Not on file  Tobacco Use   Smoking status: Former    Packs/day: 0.00    Types: Cigarettes   Smokeless tobacco: Never   Tobacco comments:    Smoked when she was a teenager.   Vaping Use   Vaping Use: Never used  Substance and Sexual Activity   Alcohol use: Yes    Comment: occasionally   Drug use: No   Sexual activity: Yes  Other Topics Concern   Not on file  Social History Narrative   Lives with 3 children in [place]. Does not work    Scientist, physiological Strain: Not on file  Food Insecurity: Not on file  Transportation Needs: Not on file  Physical Activity: Not on file  Stress: Not on file  Social Connections: Not on file  Intimate Partner Violence: Not on file    Review of Systems  All other systems reviewed and are negative.       Objective    BP (!) 150/90   Pulse 75   Temp 98.1 F (36.7 C)   Resp 16   Ht 5' 6.54" (1.69 m)   Wt 238 lb 6.4 oz (108.1 kg)   LMP 05/26/2017   SpO2 98%   BMI 37.86 kg/m   Physical Exam Vitals and nursing note reviewed.  Constitutional:      General: She is not in acute distress. Cardiovascular:     Rate and Rhythm: Normal rate and regular rhythm.  Pulmonary:     Effort: Pulmonary effort is normal.     Breath sounds: Normal breath sounds. No wheezing.  Abdominal:     Palpations: Abdomen is soft.     Tenderness: There is no abdominal tenderness.  Musculoskeletal:     Right lower leg: No edema.     Left lower leg: No edema.     Comments: Utilizing  cane  Skin:    Comments: Wound dressing dry on right foot  Neurological:     General: No focal deficit present.     Mental Status: She is alert and oriented to person, place, and time.     {Labs (Optional):23779}    Assessment & Plan:   1. Type 2 diabetes mellitus with stage 4 chronic kidney disease, without long-term current use of insulin (HCC) Discussed at length with patient that it has been recommended that she present to the ED to have urgent evaluation and management based on her kidney function.  - POCT glycosylated hemoglobin (Hb A1C)    No follow-ups on file.   Becky Sax, MD

## 2021-12-24 ENCOUNTER — Telehealth: Payer: Self-pay | Admitting: *Deleted

## 2021-12-24 ENCOUNTER — Encounter: Payer: Self-pay | Admitting: Family Medicine

## 2021-12-24 NOTE — Telephone Encounter (Signed)
I have attempted without success to contact this patient by phone to I left a message on answering machine. Call place to patient regarding her labs and if she has been able to get to the ER for f/u

## 2021-12-27 ENCOUNTER — Emergency Department (HOSPITAL_COMMUNITY)
Admission: EM | Admit: 2021-12-27 | Discharge: 2021-12-28 | Discharge status: Against Medical Advice | Payer: Self-pay | Attending: Emergency Medicine | Admitting: Emergency Medicine

## 2021-12-27 ENCOUNTER — Other Ambulatory Visit: Payer: Self-pay

## 2021-12-27 ENCOUNTER — Ambulatory Visit (INDEPENDENT_AMBULATORY_CARE_PROVIDER_SITE_OTHER): Payer: Self-pay | Admitting: Podiatry

## 2021-12-27 ENCOUNTER — Emergency Department (HOSPITAL_COMMUNITY): Payer: Self-pay

## 2021-12-27 ENCOUNTER — Encounter (HOSPITAL_COMMUNITY): Payer: Self-pay | Admitting: Emergency Medicine

## 2021-12-27 DIAGNOSIS — Z5321 Procedure and treatment not carried out due to patient leaving prior to being seen by health care provider: Secondary | ICD-10-CM | POA: Insufficient documentation

## 2021-12-27 DIAGNOSIS — R7989 Other specified abnormal findings of blood chemistry: Secondary | ICD-10-CM | POA: Insufficient documentation

## 2021-12-27 DIAGNOSIS — E119 Type 2 diabetes mellitus without complications: Secondary | ICD-10-CM | POA: Insufficient documentation

## 2021-12-27 DIAGNOSIS — Z91199 Patient's noncompliance with other medical treatment and regimen due to unspecified reason: Secondary | ICD-10-CM

## 2021-12-27 DIAGNOSIS — R319 Hematuria, unspecified: Secondary | ICD-10-CM | POA: Insufficient documentation

## 2021-12-27 LAB — CBC WITH DIFFERENTIAL/PLATELET
Abs Immature Granulocytes: 0.03 10*3/uL (ref 0.00–0.07)
Basophils Absolute: 0 10*3/uL (ref 0.0–0.1)
Basophils Relative: 1 %
Eosinophils Absolute: 0.1 10*3/uL (ref 0.0–0.5)
Eosinophils Relative: 2 %
HCT: 26.1 % — ABNORMAL LOW (ref 36.0–46.0)
Hemoglobin: 8.2 g/dL — ABNORMAL LOW (ref 12.0–15.0)
Immature Granulocytes: 1 %
Lymphocytes Relative: 30 %
Lymphs Abs: 1.8 10*3/uL (ref 0.7–4.0)
MCH: 28.3 pg (ref 26.0–34.0)
MCHC: 31.4 g/dL (ref 30.0–36.0)
MCV: 90 fL (ref 80.0–100.0)
Monocytes Absolute: 0.3 10*3/uL (ref 0.1–1.0)
Monocytes Relative: 6 %
Neutro Abs: 3.6 10*3/uL (ref 1.7–7.7)
Neutrophils Relative %: 60 %
Platelets: 278 10*3/uL (ref 150–400)
RBC: 2.9 MIL/uL — ABNORMAL LOW (ref 3.87–5.11)
RDW: 14.8 % (ref 11.5–15.5)
WBC: 5.8 10*3/uL (ref 4.0–10.5)
nRBC: 0 % (ref 0.0–0.2)

## 2021-12-27 LAB — COMPREHENSIVE METABOLIC PANEL
ALT: 9 U/L (ref 0–44)
AST: 16 U/L (ref 15–41)
Albumin: 3 g/dL — ABNORMAL LOW (ref 3.5–5.0)
Alkaline Phosphatase: 73 U/L (ref 38–126)
Anion gap: 14 (ref 5–15)
BUN: 59 mg/dL — ABNORMAL HIGH (ref 6–20)
CO2: 13 mmol/L — ABNORMAL LOW (ref 22–32)
Calcium: 5.5 mg/dL — CL (ref 8.9–10.3)
Chloride: 113 mmol/L — ABNORMAL HIGH (ref 98–111)
Creatinine, Ser: 10.38 mg/dL — ABNORMAL HIGH (ref 0.44–1.00)
GFR, Estimated: 4 mL/min — ABNORMAL LOW (ref >60)
Glucose, Bld: 101 mg/dL — ABNORMAL HIGH (ref 70–99)
Potassium: 2.8 mmol/L — ABNORMAL LOW (ref 3.5–5.1)
Sodium: 140 mmol/L (ref 135–145)
Total Bilirubin: 0.5 mg/dL (ref 0.3–1.2)
Total Protein: 7.2 g/dL (ref 6.5–8.1)

## 2021-12-27 NOTE — ED Provider Triage Note (Addendum)
Emergency Medicine Provider Triage Evaluation Note  [name] , a 56 y.o. female  was evaluated in triage.  Pt complains of persistent hematuria and a right foot wound.  She has a history of diabetes.  She was seen 3-4 weeks ago for hematuria, had a CT which was reassuring, but markedly elevated creatinine.  No UTI at that time.  Patient did not want to stay in the hospital and was urged to follow-up with PCP and nephrology.  She presents tonight for evaluation of persistent hematuria, and at the urging of PCP.  No dysuria, increased frequency or urgency, fever.  No flank pain or abdominal pain.  Patient also reports that she developed a wound on the sole of her right foot with drainage.  She has been on a "green pill" for this.  She was due to follow-up with podiatry today, but did not go to her appointment because she was coming to the emergency department anyway.  Review of Systems  Positive: Foot wound, blood in the urine Negative: Fever, abdominal pain or flank pain  Physical Exam  BP (!) 150/91 (BP Location: Right Arm)   Pulse 81   Temp 98.1 F (36.7 C) (Oral)   Resp 16   LMP 05/26/2017   SpO2 100%  Gen:   Awake, no distress   Resp:  Normal effort  MSK:   Moves extremities without difficulty  Other:  Patient with 1 cm ulceration to the bottom of the right foot with a scant amount of purulent discharge, foul odor.  Medical Decision Making  Medically screening exam initiated at 7:58 PM.  Appropriate orders placed.  [name] was informed that the remainder of the evaluation will be completed by another provider, this initial triage assessment does not replace that evaluation, and the importance of remaining in the ED until their evaluation is complete.  Patient's creatinine greater than 10, anemia, hypokalemia and hypocalcemia also noted.  Kidney disease appears to be chronic over the past several months.  Added renal ultrasound.      Natalie Cater, [name] 12/27/21  2123

## 2021-12-27 NOTE — ED Triage Notes (Signed)
Patient reports worsening right foot wound infection with malodorous drainage onset several weeks ago , denies fever or chills , she adds hematuria .

## 2021-12-27 NOTE — Progress Notes (Signed)
Patient was no-show for appointment today 

## 2021-12-28 LAB — URINALYSIS, ROUTINE W REFLEX MICROSCOPIC
Bilirubin Urine: NEGATIVE
Glucose, UA: NEGATIVE mg/dL
Ketones, ur: NEGATIVE mg/dL
Leukocytes,Ua: NEGATIVE
Nitrite: NEGATIVE
Protein, ur: >=300 mg/dL — AB
Specific Gravity, Urine: 1.007 (ref 1.005–1.030)
pH: 5 (ref 5.0–8.0)

## 2021-12-28 NOTE — ED Notes (Signed)
Patient did not answer in waiting room.

## 2021-12-31 ENCOUNTER — Encounter (HOSPITAL_COMMUNITY): Payer: Self-pay | Admitting: Emergency Medicine

## 2021-12-31 ENCOUNTER — Other Ambulatory Visit: Payer: Self-pay

## 2021-12-31 ENCOUNTER — Inpatient Hospital Stay (HOSPITAL_COMMUNITY)
Admission: EM | Admit: 2021-12-31 | Discharge: 2022-01-10 | DRG: 638 | Disposition: A | Discharge status: Home / Self Care | Payer: Self-pay | Attending: Internal Medicine | Admitting: Internal Medicine

## 2021-12-31 ENCOUNTER — Emergency Department (HOSPITAL_COMMUNITY): Payer: Self-pay

## 2021-12-31 DIAGNOSIS — Z792 Long term (current) use of antibiotics: Secondary | ICD-10-CM

## 2021-12-31 DIAGNOSIS — L089 Local infection of the skin and subcutaneous tissue, unspecified: Secondary | ICD-10-CM

## 2021-12-31 DIAGNOSIS — Z79899 Other long term (current) drug therapy: Secondary | ICD-10-CM

## 2021-12-31 DIAGNOSIS — I120 Hypertensive chronic kidney disease with stage 5 chronic kidney disease or end stage renal disease: Secondary | ICD-10-CM | POA: Diagnosis present

## 2021-12-31 DIAGNOSIS — M1460 Charcot's joint, unspecified site: Secondary | ICD-10-CM

## 2021-12-31 DIAGNOSIS — I872 Venous insufficiency (chronic) (peripheral): Secondary | ICD-10-CM | POA: Diagnosis present

## 2021-12-31 DIAGNOSIS — E1129 Type 2 diabetes mellitus with other diabetic kidney complication: Secondary | ICD-10-CM | POA: Diagnosis present

## 2021-12-31 DIAGNOSIS — E872 Acidosis, unspecified: Secondary | ICD-10-CM | POA: Diagnosis present

## 2021-12-31 DIAGNOSIS — Z87891 Personal history of nicotine dependence: Secondary | ICD-10-CM

## 2021-12-31 DIAGNOSIS — E876 Hypokalemia: Secondary | ICD-10-CM | POA: Diagnosis present

## 2021-12-31 DIAGNOSIS — I890 Lymphedema, not elsewhere classified: Secondary | ICD-10-CM | POA: Diagnosis present

## 2021-12-31 DIAGNOSIS — E8770 Fluid overload, unspecified: Secondary | ICD-10-CM | POA: Diagnosis not present

## 2021-12-31 DIAGNOSIS — L97419 Non-pressure chronic ulcer of right heel and midfoot with unspecified severity: Secondary | ICD-10-CM | POA: Diagnosis present

## 2021-12-31 DIAGNOSIS — Z880 Allergy status to penicillin: Secondary | ICD-10-CM

## 2021-12-31 DIAGNOSIS — Z91158 Patient's noncompliance with renal dialysis for other reason: Secondary | ICD-10-CM

## 2021-12-31 DIAGNOSIS — E669 Obesity, unspecified: Secondary | ICD-10-CM | POA: Diagnosis present

## 2021-12-31 DIAGNOSIS — E11628 Type 2 diabetes mellitus with other skin complications: Principal | ICD-10-CM | POA: Diagnosis present

## 2021-12-31 DIAGNOSIS — E8729 Other acidosis: Secondary | ICD-10-CM | POA: Diagnosis present

## 2021-12-31 DIAGNOSIS — L03115 Cellulitis of right lower limb: Secondary | ICD-10-CM | POA: Diagnosis present

## 2021-12-31 DIAGNOSIS — M898X9 Other specified disorders of bone, unspecified site: Secondary | ICD-10-CM | POA: Diagnosis present

## 2021-12-31 DIAGNOSIS — D631 Anemia in chronic kidney disease: Secondary | ICD-10-CM | POA: Diagnosis present

## 2021-12-31 DIAGNOSIS — N185 Chronic kidney disease, stage 5: Secondary | ICD-10-CM | POA: Diagnosis present

## 2021-12-31 DIAGNOSIS — E11610 Type 2 diabetes mellitus with diabetic neuropathic arthropathy: Secondary | ICD-10-CM | POA: Diagnosis present

## 2021-12-31 DIAGNOSIS — Z515 Encounter for palliative care: Secondary | ICD-10-CM

## 2021-12-31 DIAGNOSIS — N179 Acute kidney failure, unspecified: Secondary | ICD-10-CM

## 2021-12-31 DIAGNOSIS — E11621 Type 2 diabetes mellitus with foot ulcer: Secondary | ICD-10-CM | POA: Diagnosis present

## 2021-12-31 DIAGNOSIS — R197 Diarrhea, unspecified: Secondary | ICD-10-CM | POA: Diagnosis present

## 2021-12-31 DIAGNOSIS — I10 Essential (primary) hypertension: Secondary | ICD-10-CM | POA: Diagnosis present

## 2021-12-31 DIAGNOSIS — Z91199 Patient's noncompliance with other medical treatment and regimen due to unspecified reason: Secondary | ICD-10-CM

## 2021-12-31 DIAGNOSIS — E1151 Type 2 diabetes mellitus with diabetic peripheral angiopathy without gangrene: Secondary | ICD-10-CM | POA: Diagnosis present

## 2021-12-31 DIAGNOSIS — Z7984 Long term (current) use of oral hypoglycemic drugs: Secondary | ICD-10-CM

## 2021-12-31 DIAGNOSIS — Z833 Family history of diabetes mellitus: Secondary | ICD-10-CM

## 2021-12-31 DIAGNOSIS — E1122 Type 2 diabetes mellitus with diabetic chronic kidney disease: Secondary | ICD-10-CM | POA: Diagnosis present

## 2021-12-31 DIAGNOSIS — N250 Renal osteodystrophy: Secondary | ICD-10-CM | POA: Diagnosis present

## 2021-12-31 DIAGNOSIS — Z6838 Body mass index (BMI) 38.0-38.9, adult: Secondary | ICD-10-CM

## 2021-12-31 DIAGNOSIS — N2581 Secondary hyperparathyroidism of renal origin: Secondary | ICD-10-CM | POA: Diagnosis present

## 2021-12-31 DIAGNOSIS — E1165 Type 2 diabetes mellitus with hyperglycemia: Secondary | ICD-10-CM | POA: Diagnosis present

## 2021-12-31 DIAGNOSIS — L97513 Non-pressure chronic ulcer of other part of right foot with necrosis of muscle: Secondary | ICD-10-CM

## 2021-12-31 DIAGNOSIS — N186 End stage renal disease: Secondary | ICD-10-CM | POA: Diagnosis present

## 2021-12-31 NOTE — ED Triage Notes (Signed)
Patient with foot laceration on the bottom of her right foot.  Patient states that she has some drainage from the foot.  She was here on Friday and left before she was seen.

## 2021-12-31 NOTE — ED Provider Triage Note (Signed)
Emergency Medicine Provider Triage Evaluation Note  [name] , a 56 y.o. female  was evaluated in triage.  Pt complains of shaving the bottom of her foot w/a callus remover several weeks ago. Denies fever, chills. Is diabetic.   Review of Systems  Positive: Foot wound Negative: Fever, chills  Physical Exam  BP (!) 142/88   Pulse 81   Temp 98.1 F (36.7 C) (Oral)   Resp 16   LMP 05/26/2017   SpO2 100%  Gen:   Awake, no distress   Resp:  Normal effort  MSK:   Moves extremities without difficulty  Other:  +ulceration to R plantar foot, w/foul smelling drainage. +pedal pulse  Medical Decision Making  Medically screening exam initiated at 8:26 PM.  Appropriate orders placed.  Wandalee Klang was informed that the remainder of the evaluation will be completed by another provider, this initial triage assessment does not replace that evaluation, and the importance of remaining in the ED until their evaluation is complete.   Osvaldo Shipper, [place] 12/31/21 2032

## 2022-01-01 ENCOUNTER — Ambulatory Visit: Payer: Self-pay | Admitting: *Deleted

## 2022-01-01 ENCOUNTER — Inpatient Hospital Stay (HOSPITAL_COMMUNITY): Payer: Self-pay

## 2022-01-01 DIAGNOSIS — E1165 Type 2 diabetes mellitus with hyperglycemia: Secondary | ICD-10-CM

## 2022-01-01 DIAGNOSIS — N185 Chronic kidney disease, stage 5: Secondary | ICD-10-CM

## 2022-01-01 DIAGNOSIS — R197 Diarrhea, unspecified: Secondary | ICD-10-CM

## 2022-01-01 DIAGNOSIS — E669 Obesity, unspecified: Secondary | ICD-10-CM

## 2022-01-01 DIAGNOSIS — E11628 Type 2 diabetes mellitus with other skin complications: Secondary | ICD-10-CM

## 2022-01-01 DIAGNOSIS — L089 Local infection of the skin and subcutaneous tissue, unspecified: Secondary | ICD-10-CM | POA: Diagnosis present

## 2022-01-01 DIAGNOSIS — E876 Hypokalemia: Secondary | ICD-10-CM

## 2022-01-01 DIAGNOSIS — M1460 Charcot's joint, unspecified site: Secondary | ICD-10-CM | POA: Insufficient documentation

## 2022-01-01 DIAGNOSIS — E8729 Other acidosis: Secondary | ICD-10-CM | POA: Insufficient documentation

## 2022-01-01 DIAGNOSIS — L97513 Non-pressure chronic ulcer of other part of right foot with necrosis of muscle: Secondary | ICD-10-CM | POA: Insufficient documentation

## 2022-01-01 DIAGNOSIS — I10 Essential (primary) hypertension: Secondary | ICD-10-CM

## 2022-01-01 LAB — CBC WITH DIFFERENTIAL/PLATELET
Abs Immature Granulocytes: 0.03 10*3/uL (ref 0.00–0.07)
Basophils Absolute: 0 10*3/uL (ref 0.0–0.1)
Basophils Relative: 1 %
Eosinophils Absolute: 0.1 10*3/uL (ref 0.0–0.5)
Eosinophils Relative: 1 %
HCT: 29.1 % — ABNORMAL LOW (ref 36.0–46.0)
Hemoglobin: 9.3 g/dL — ABNORMAL LOW (ref 12.0–15.0)
Immature Granulocytes: 0 %
Lymphocytes Relative: 28 %
Lymphs Abs: 1.9 10*3/uL (ref 0.7–4.0)
MCH: 29 pg (ref 26.0–34.0)
MCHC: 32 g/dL (ref 30.0–36.0)
MCV: 90.7 fL (ref 80.0–100.0)
Monocytes Absolute: 0.3 10*3/uL (ref 0.1–1.0)
Monocytes Relative: 5 %
Neutro Abs: 4.5 10*3/uL (ref 1.7–7.7)
Neutrophils Relative %: 65 %
Platelets: 277 10*3/uL (ref 150–400)
RBC: 3.21 MIL/uL — ABNORMAL LOW (ref 3.87–5.11)
RDW: 15.1 % (ref 11.5–15.5)
WBC: 6.9 10*3/uL (ref 4.0–10.5)
nRBC: 0 % (ref 0.0–0.2)

## 2022-01-01 LAB — COMPREHENSIVE METABOLIC PANEL
ALT: 10 U/L (ref 0–44)
AST: 18 U/L (ref 15–41)
Albumin: 3.4 g/dL — ABNORMAL LOW (ref 3.5–5.0)
Alkaline Phosphatase: 74 U/L (ref 38–126)
Anion gap: 16 — ABNORMAL HIGH (ref 5–15)
BUN: 68 mg/dL — ABNORMAL HIGH (ref 6–20)
CO2: 10 mmol/L — ABNORMAL LOW (ref 22–32)
Calcium: 5.8 mg/dL — CL (ref 8.9–10.3)
Chloride: 113 mmol/L — ABNORMAL HIGH (ref 98–111)
Creatinine, Ser: 10.93 mg/dL — ABNORMAL HIGH (ref 0.44–1.00)
GFR, Estimated: 4 mL/min — ABNORMAL LOW (ref >60)
Glucose, Bld: 96 mg/dL (ref 70–99)
Potassium: 3.2 mmol/L — ABNORMAL LOW (ref 3.5–5.1)
Sodium: 139 mmol/L (ref 135–145)
Total Bilirubin: 0.5 mg/dL (ref 0.3–1.2)
Total Protein: 8 g/dL (ref 6.5–8.1)

## 2022-01-01 LAB — IRON AND TIBC
Iron: 70 ug/dL (ref 28–170)
Saturation Ratios: 32 % — ABNORMAL HIGH (ref 10.4–31.8)
TIBC: 220 ug/dL — ABNORMAL LOW (ref 250–450)
UIBC: 150 ug/dL

## 2022-01-01 LAB — I-STAT CHEM 8, ED
BUN: 66 mg/dL — ABNORMAL HIGH (ref 6–20)
Calcium, Ion: 0.66 mmol/L — CL (ref 1.15–1.40)
Chloride: 118 mmol/L — ABNORMAL HIGH (ref 98–111)
Creatinine, Ser: 12.5 mg/dL — ABNORMAL HIGH (ref 0.44–1.00)
Glucose, Bld: 93 mg/dL (ref 70–99)
HCT: 31 % — ABNORMAL LOW (ref 36.0–46.0)
Hemoglobin: 10.5 g/dL — ABNORMAL LOW (ref 12.0–15.0)
Potassium: 3.2 mmol/L — ABNORMAL LOW (ref 3.5–5.1)
Sodium: 143 mmol/L (ref 135–145)
TCO2: 12 mmol/L — ABNORMAL LOW (ref 22–32)

## 2022-01-01 LAB — LACTIC ACID, PLASMA: Lactic Acid, Venous: 1.1 mmol/L (ref 0.5–1.9)

## 2022-01-01 LAB — PROTEIN, URINE, RANDOM: Total Protein, Urine: 226 mg/dL

## 2022-01-01 LAB — PREALBUMIN: Prealbumin: 11 mg/dL — ABNORMAL LOW (ref 18–38)

## 2022-01-01 LAB — FERRITIN: Ferritin: 87 ng/mL (ref 11–307)

## 2022-01-01 LAB — SODIUM, URINE, RANDOM: Sodium, Ur: 73 mmol/L

## 2022-01-01 LAB — PHOSPHORUS: Phosphorus: 7.8 mg/dL — ABNORMAL HIGH (ref 2.5–4.6)

## 2022-01-01 LAB — VITAMIN D 25 HYDROXY (VIT D DEFICIENCY, FRACTURES): Vit D, 25-Hydroxy: 11.67 ng/mL — ABNORMAL LOW (ref 30–100)

## 2022-01-01 LAB — HIV ANTIBODY (ROUTINE TESTING W REFLEX): HIV Screen 4th Generation wRfx: NONREACTIVE

## 2022-01-01 LAB — C-REACTIVE PROTEIN: CRP: 1.9 mg/dL — ABNORMAL HIGH (ref <1.0)

## 2022-01-01 LAB — CREATININE, URINE, RANDOM: Creatinine, Urine: 49 mg/dL

## 2022-01-01 LAB — SEDIMENTATION RATE: Sed Rate: 46 mm/hr — ABNORMAL HIGH (ref 0–22)

## 2022-01-01 MED ORDER — ACETAMINOPHEN 650 MG RE SUPP: 650.0000 mg | Freq: Four times a day (QID) | RECTAL | Status: DC | PRN

## 2022-01-01 MED ORDER — POTASSIUM CHLORIDE CRYS ER 20 MEQ PO TBCR
20.0000 meq | EXTENDED_RELEASE_TABLET | ORAL | Status: AC
  Administered 2022-01-01: 20 meq via ORAL
  Filled 2022-01-01: qty 1

## 2022-01-01 MED ORDER — GENTAMICIN SULFATE 0.1 % EX OINT
TOPICAL_OINTMENT | Freq: Two times a day (BID) | CUTANEOUS | Status: DC
  Filled 2022-01-01 (×3): qty 15

## 2022-01-01 MED ORDER — VANCOMYCIN VARIABLE DOSE PER UNSTABLE RENAL FUNCTION (PHARMACIST DOSING): Status: DC

## 2022-01-01 MED ORDER — VANCOMYCIN HCL IN DEXTROSE 1-5 GM/200ML-% IV SOLN
1000.0000 mg | Freq: Once | INTRAVENOUS | Status: AC
  Administered 2022-01-01: 1000 mg via INTRAVENOUS
  Filled 2022-01-01: qty 200

## 2022-01-01 MED ORDER — HEPARIN SODIUM (PORCINE) 5000 UNIT/ML IJ SOLN
5000.0000 [IU] | Freq: Three times a day (TID) | INTRAMUSCULAR | Status: DC
  Administered 2022-01-01 – 2022-01-09 (×5): 5000 [IU] via SUBCUTANEOUS
  Filled 2022-01-01 (×16): qty 1

## 2022-01-01 MED ORDER — SODIUM CHLORIDE 0.9 % IV SOLN
1.0000 g | INTRAVENOUS | Status: DC
  Administered 2022-01-02 – 2022-01-09 (×8): 1 g via INTRAVENOUS
  Filled 2022-01-01: qty 1
  Filled 2022-01-01 (×9): qty 10

## 2022-01-01 MED ORDER — SODIUM CHLORIDE 0.9 % IV SOLN
4.0000 g | Freq: Once | INTRAVENOUS | Status: AC
  Administered 2022-01-01: 4 g via INTRAVENOUS
  Filled 2022-01-01: qty 40

## 2022-01-01 MED ORDER — ALBUTEROL SULFATE (2.5 MG/3ML) 0.083% IN NEBU: 2.5000 mg | INHALATION_SOLUTION | Freq: Four times a day (QID) | RESPIRATORY_TRACT | Status: DC | PRN

## 2022-01-01 MED ORDER — ACETAMINOPHEN 325 MG PO TABS
650.0000 mg | ORAL_TABLET | Freq: Four times a day (QID) | ORAL | Status: DC | PRN
  Administered 2022-01-02: 650 mg via ORAL
  Filled 2022-01-01: qty 2

## 2022-01-01 MED ORDER — SACCHAROMYCES BOULARDII 250 MG PO CAPS
250.0000 mg | ORAL_CAPSULE | Freq: Two times a day (BID) | ORAL | Status: DC
  Administered 2022-01-01 – 2022-01-06 (×10): 250 mg via ORAL
  Filled 2022-01-01 (×11): qty 1

## 2022-01-01 MED ORDER — HYDRALAZINE HCL 20 MG/ML IJ SOLN: 10.0000 mg | INTRAMUSCULAR | Status: DC | PRN

## 2022-01-01 MED ORDER — CALCIUM CARBONATE ANTACID 500 MG PO CHEW
1.0000 | CHEWABLE_TABLET | Freq: Once | ORAL | Status: AC
  Administered 2022-01-01: 200 mg via ORAL
  Filled 2022-01-01: qty 1

## 2022-01-01 MED ORDER — SODIUM CHLORIDE 0.9 % IV SOLN
2.0000 g | INTRAVENOUS | Status: AC
  Administered 2022-01-01: 2 g via INTRAVENOUS
  Filled 2022-01-01: qty 12.5

## 2022-01-01 NOTE — ED Notes (Signed)
Provider at bedside at this time

## 2022-01-01 NOTE — ED Notes (Signed)
Dr. Tamala Julian was at the bedside explaining to the patient the severity of her illness, pt did not seem to care and refused all treatments. Dr. Tamala Julian told the patient he would get palliative care involved. Pt stated we just needed to leave her alone and let her get some sleep. Will continue to monitor.

## 2022-01-01 NOTE — Progress Notes (Signed)
Received a consult for midline placement. Current GFR of 4. Spoke with attending and Dr. Augustin Coupe, nephrology. Dr. Augustin Coupe reported he prefers a central line due to CKD4. Will cancel midline order.

## 2022-01-01 NOTE — ED Notes (Signed)
Pt refusing to let this RN check her vitals at this time. Pt stated "didn't I tell you I just wanted to sleep and to leave me alone." This RN exited the room and will continue to monitor.

## 2022-01-01 NOTE — ED Notes (Signed)
Per previous RN who was caring for the patient. Up to this point in time the patient has refused most of her work up and has not been cooperative with staff.

## 2022-01-01 NOTE — Consult Note (Signed)
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  Given her diabetes and kidney disease clearly this is a limb threatening problem.  Arterial Doppler studies have been ordered.  If this shows evidence of significant infrainguinal arterial occlusive disease she may require arteriography.  If she is to begin dialysis we could do this with contrast to allow better visualization.  I think CO2 would give suboptimal visualization given her obesity, lymphedema, and likely tibial disease.  STAGE V CHRONIC KIDNEY DISEASE: We have been asked to place a tunneled catheter and long-term access.  Given the infection in her foot I will hold off on long-term access until the infection is cleared.  If she needs a catheter for dialysis now then certainly we could place that.  If we can wait until later in the week when some of her work-up is complete to determine what needs to be done about her foot that may be helpful.  Upper extremity vein map has been ordered.  COMBINED CHRONIC VENOUS INSUFFICIENCY AND LYMPHEDEMA: Patient has significant lymphedema bilaterally.  She has hyperpigmentation to suggest underlying chronic venous insufficiency.  Thus she has combined chronic venous insufficiency and lymphedema.  Pending the results of her arterial work-up she will need elevation and compression if this is tolerated.  REASON FOR CONSULT:    CKD 5.  The consult is requested by Dr. Augustin Coupe.  HPI:   [name] is a 56 y.o. female who was admitted yesterday with a wound on her right foot.  She states that she had a callus on her foot around 2 weeks ago when she tried to trim this.  She developed an infection and now has a malodorous wound of the right foot.   On my history I do not get any clear-cut history of claudication although activity is fairly limited.  She recently got a prescription for a walker.  I do not get  any history of rest pain.  Risk factors for peripheral arterial disease include type 2 diabetes and hypertension.  She denies any history of hypercholesterolemia, family history of premature cardiovascular disease, or tobacco use.  Past Medical History:  Diagnosis Date   Asthma    Child   Diabetes mellitus    Obesity     Family History  Problem Relation Age of Onset   Diabetes Mother     SOCIAL HISTORY: Social History   Tobacco Use   Smoking status: Former    Packs/day: 0.00    Types: Cigarettes   Smokeless tobacco: Never   Tobacco comments:    Smoked when she was a teenager.   Substance Use Topics   Alcohol use: Yes    Comment: occasionally    Allergies  Allergen Reactions   Penicillins Rash    Current Facility-Administered Medications  Medication Dose Route Frequency Provider Last Rate Last Admin   acetaminophen (TYLENOL) tablet 650 mg  650 mg Oral Q6H PRN Norval Morton, MD       Or   acetaminophen (TYLENOL) suppository 650 mg  650 mg Rectal Q6H PRN Fuller Plan A, MD       albuterol (PROVENTIL) (2.5 MG/3ML) 0.083% nebulizer solution 2.5 mg  2.5 mg Nebulization Q6H PRN Norval Morton, MD       [START ON 01/02/2022] ceFEPIme (MAXIPIME) 1 g in sodium chloride 0.9 % 100 mL IVPB  1 g Intravenous Q24H Bell, Lorin C,  RPH       gentamicin ointment (GARAMYCIN) 0.1 %   Topical BID Criselda Peaches, DPM       heparin injection 5,000 Units  5,000 Units Subcutaneous Q8H Fuller Plan A, MD   5,000 Units at 01/01/22 6861   hydrALAZINE (APRESOLINE) injection 10 mg  10 mg Intravenous Q4H PRN Norval Morton, MD       saccharomyces boulardii (FLORASTOR) capsule 250 mg  250 mg Oral BID Fuller Plan A, MD   250 mg at 01/01/22 1246   vancomycin variable dose per unstable renal function (pharmacist dosing)   Does not apply See admin instructions Merrilee Jansky, RPH       Current Outpatient Medications  Medication Sig Dispense Refill   albuterol (VENTOLIN HFA) 108 (90 Base)  MCG/ACT inhaler Inhale 1-2 puffs into the lungs every 6 (six) hours as needed for wheezing or shortness of breath. (Patient taking differently: Inhale 1-2 puffs into the lungs daily as needed for wheezing or shortness of breath.) 18 g 0   furosemide (LASIX) 20 MG tablet As needed 1 daily prn for leg swelling (Patient taking differently: Take 20 mg by mouth daily.) 30 tablet 0   ibuprofen (ADVIL) 600 MG tablet Take 1 tablet (600 mg total) by mouth every 8 (eight) hours as needed for moderate pain. 30 tablet 0   lisinopril-hydrochlorothiazide (ZESTORETIC) 20-25 MG tablet Take 1 tablet by mouth daily. 90 tablet 0   metFORMIN (GLUCOPHAGE) 850 MG tablet Take 1 tablet (850 mg total) by mouth 2 (two) times daily with a meal. 180 tablet 0   Blood Glucose Monitoring Suppl (TRUE METRIX METER) w/Device KIT Use to check blood sugar 3 times daily 1 kit 0   clindamycin (CLEOCIN) 300 MG capsule Take 1 capsule (300 mg total) by mouth 3 (three) times daily. (Patient not taking: Reported on 01/01/2022) 30 capsule 0   fluticasone-salmeterol (ADVAIR) 500-50 MCG/ACT AEPB Inhale 1 puff into the lungs in the morning and at bedtime. (Patient not taking: Reported on 01/01/2022) 1 each 2   glipiZIDE (GLIPIZIDE XL) 10 MG 24 hr tablet Take 1 tablet (10 mg total) by mouth daily with breakfast. To lower blood sugar (Patient not taking: Reported on 01/01/2022) 90 tablet 0   glucose blood (TRUE METRIX BLOOD GLUCOSE TEST) test strip Use to check blood sugar three times per day 100 each 12   trimethoprim-polymyxin b (POLYTRIM) ophthalmic solution Place 1 drop into the left eye every 6 (six) hours. (Patient not taking: Reported on 01/01/2022) 10 mL 0   TRUEPLUS LANCETS 28G MISC Use to check blood sugar 3 times daily 100 each 11    REVIEW OF SYSTEMS:  _0  denotes positive finding, _1  denotes negative finding Cardiac  Comments:  Chest pain or chest pressure:    Shortness of breath upon exertion:    Short of breath when lying flat:     Irregular heart rhythm:        Vascular    Pain in calf, thigh, or hip brought on by ambulation:    Pain in feet at night that wakes you up from your sleep:     Blood clot in your veins:    Leg swelling:  x       Pulmonary    Oxygen at home:    Productive cough:     Wheezing:         Neurologic    Sudden weakness in arms or legs:     Sudden numbness  in arms or legs:     Sudden onset of difficulty speaking or slurred speech:    Temporary loss of vision in one eye:     Problems with dizziness:         Gastrointestinal    Blood in stool:     Vomited blood:         Genitourinary    Burning when urinating:     Blood in urine:        Psychiatric    Major depression:         Hematologic    Bleeding problems:    Problems with blood clotting too easily:        Skin    Rashes or ulcers: x       Constitutional    Fever or chills:    -  PHYSICAL EXAM:   Vitals:   01/01/22 0900 01/01/22 0917 01/01/22 1030 01/01/22 1430  BP: (!) 145/93  (!) 161/92 (!) 167/98  Pulse: 79  82 86  Resp: 16  (!) 22 (!) 21  Temp:  97.7 F (36.5 C)  97.9 F (36.6 C)  TempSrc:  Oral  Oral  SpO2: 100%  100% 100%  Weight:      Height:       Body mass index is 38.74 kg/m. GENERAL: The patient is a well-nourished female, in no acute distress. The vital signs are documented above. CARDIAC: There is a regular rate and rhythm.  VASCULAR: I do not detect carotid bruits. She has palpable radial pulses. She has palpable femoral pulses. She has significant bilateral lower extremity swelling which is more significant on the right side.  For this reason it is difficult to assess her pulses. On the right side she does have a brisk dorsalis pedis signal and a monophasic posterior tibial signal. On the left side she has a biphasic dorsalis pedis and posterior tibial signal. She has evidence of lymphedema bilaterally with some lipodermatosclerosis.        PULMONARY: There is good air exchange  bilaterally without wheezing or rales. ABDOMEN: Soft and non-tender with normal pitched bowel sounds.  MUSCULOSKELETAL: There are no major deformities. NEUROLOGIC: No focal weakness or paresthesias are detected. SKIN: She has a malodorous wound of her right foot on the plantar aspect.       PSYCHIATRIC: The patient has a normal affect.  DATA:    LABS: Her GFR is 4.  White blood cell count is 6.9.  Hemoglobin 9.3.  Platelets 277,000.  ARTERIAL DOPPLER STUDY: Has been ordered  UPPER EXTREMITY VEIN MAP: Has been ordered  MRI RIGHT FOOT: MRI of the right foot shows severe Charcot arthropathy involving the midfoot which has progressed since previous exams.  She has evidence of cellulitis.  She has evidence of tenosynovitis of the flexor houses longus.  No clear abscess is seen or evidence of osteomyelitis.  Deitra Mayo [facility]

## 2022-01-01 NOTE — ED Provider Notes (Signed)
[facility] EMERGENCY DEPARTMENT Provider Note   CSN: [id number] Arrival date & time: 12/31/21  1908     History  Chief Complaint  Patient presents with   Wound Infection    [name] is a 56 y.o. female.  The history is provided by the patient.  Wound Check This is a new problem. The current episode started more than 1 week ago. The problem occurs constantly. The problem has been gradually worsening. Pertinent negatives include no chest pain, no abdominal pain, no headaches and no shortness of breath. Nothing aggravates the symptoms. Nothing relieves the symptoms. Treatments tried: 2 days of oral antibiotics. The treatment provided no relief.  Patient with renal failure and DM presents with     Past Medical History:  Diagnosis Date   Asthma    Child   Diabetes mellitus    Obesity      Home Medications Prior to Admission medications   Medication Sig Start Date End Date Taking? Authorizing Provider  albuterol (VENTOLIN HFA) 108 (90 Base) MCG/ACT inhaler Inhale 1-2 puffs into the lungs every 6 (six) hours as needed for wheezing or shortness of breath. 07/19/21   [name], MD  Blood Glucose Monitoring Suppl (TRUE METRIX METER) w/Device KIT Use to check blood sugar 3 times daily 11/03/17   [name], [name], MD  clindamycin (CLEOCIN) 300 MG capsule Take 1 capsule (300 mg total) by mouth 3 (three) times daily. 12/15/21   [name], [name], [name]  fluticasone-salmeterol (ADVAIR) 500-50 MCG/ACT AEPB Inhale 1 puff into the lungs in the morning and at bedtime. 07/19/21   [name], MD  furosemide (LASIX) 20 MG tablet As needed 1 daily prn for leg swelling 09/11/21   [name], MD  glipiZIDE (GLIPIZIDE XL) 10 MG 24 hr tablet Take 1 tablet (10 mg total) by mouth daily with breakfast. To lower blood sugar 07/19/21   [name], MD  glucose blood (TRUE METRIX BLOOD GLUCOSE TEST) test strip Use to check blood sugar three times per day 11/03/17   [name], [name], MD   ibuprofen (ADVIL) 600 MG tablet Take 1 tablet (600 mg total) by mouth every 8 (eight) hours as needed for moderate pain. 02/06/20   [name], NP  lisinopril-hydrochlorothiazide (ZESTORETIC) 20-25 MG tablet Take 1 tablet by mouth daily. 07/19/21   [name], MD  metFORMIN (GLUCOPHAGE) 850 MG tablet Take 1 tablet (850 mg total) by mouth 2 (two) times daily with a meal. 07/19/21   [name], MD  trimethoprim-polymyxin b (POLYTRIM) ophthalmic solution Place 1 drop into the left eye every 6 (six) hours. 02/22/21   [name], MD  TRUEPLUS LANCETS 28G MISC Use to check blood sugar 3 times daily 11/03/17   [name], MD      Allergies    Penicillins    Review of Systems   Review of Systems  Respiratory:  Negative for shortness of breath.   Cardiovascular:  Negative for chest pain.  Gastrointestinal:  Negative for abdominal pain.  Skin:  Positive for wound.  Neurological:  Negative for headaches.  All other systems reviewed and are negative.   Physical Exam Updated Vital Signs BP (!) 159/92 (BP Location: Right Arm)   Pulse 78   Temp 97.8 F (36.6 C) (Oral)   Resp 15   Ht _0  (1.676 m)   Wt 108.9 kg   LMP 05/26/2017   SpO2 100%   BMI 38.74 kg/m  Physical Exam Vitals and nursing note reviewed.  Constitutional:      General: She is not in acute distress.    Appearance: She is well-developed.  HENT:     Head: Normocephalic and atraumatic.     Nose: Nose normal.  Eyes:     Pupils: Pupils are equal, round, and reactive to light.  Cardiovascular:     Rate and Rhythm: Normal rate and regular rhythm.     Pulses: Normal pulses.     Heart sounds: Normal heart sounds.  Pulmonary:     Effort: Pulmonary effort is normal. No respiratory distress.     Breath sounds: Normal breath sounds.  Abdominal:     General: Bowel sounds are normal. There is no distension.     Palpations: Abdomen is soft.     Tenderness: There is no abdominal tenderness. There is no guarding  or rebound.  Genitourinary:    Vagina: No vaginal discharge.  Musculoskeletal:        General: Normal range of motion.     Cervical back: Neck supple.       Feet:  Skin:    General: Skin is warm and dry.     Capillary Refill: Capillary refill takes less than 2 seconds.     Findings: No erythema or rash.  Neurological:     General: No focal deficit present.     Deep Tendon Reflexes: Reflexes normal.  Psychiatric:        Mood and Affect: Mood normal.     ED Results / Procedures / Treatments   Labs (all labs ordered are listed, but only abnormal results are displayed) Results for orders placed or performed during the hospital encounter of 12/31/21  CBC with Differential  Result Value Ref Range   WBC 6.9 4.0 - 10.5 K/uL   RBC 3.21 (L) 3.87 - 5.11 MIL/uL   Hemoglobin 9.3 (L) 12.0 - 15.0 g/dL   HCT 29.1 (L) 36.0 - 46.0 %   MCV 90.7 80.0 - 100.0 fL   MCH 29.0 26.0 - 34.0 pg   MCHC 32.0 30.0 - 36.0 g/dL   RDW 15.1 11.5 - 15.5 %   Platelets 277 150 - 400 K/uL   nRBC 0.0 0.0 - 0.2 %   Neutrophils Relative % 65 %   Neutro Abs 4.5 1.7 - 7.7 K/uL   Lymphocytes Relative 28 %   Lymphs Abs 1.9 0.7 - 4.0 K/uL   Monocytes Relative 5 %   Monocytes Absolute 0.3 0.1 - 1.0 K/uL   Eosinophils Relative 1 %   Eosinophils Absolute 0.1 0.0 - 0.5 K/uL   Basophils Relative 1 %   Basophils Absolute 0.0 0.0 - 0.1 K/uL   Immature Granulocytes 0 %   Abs Immature Granulocytes 0.03 0.00 - 0.07 K/uL  Comprehensive metabolic panel  Result Value Ref Range   Sodium 139 135 - 145 mmol/L   Potassium 3.2 (L) 3.5 - 5.1 mmol/L   Chloride 113 (H) 98 - 111 mmol/L   CO2 10 (L) 22 - 32 mmol/L   Glucose, Bld 96 70 - 99 mg/dL   BUN 68 (H) 6 - 20 mg/dL   Creatinine, Ser 10.93 (H) 0.44 - 1.00 mg/dL   Calcium 5.8 (LL) 8.9 - 10.3 mg/dL   Total Protein 8.0 6.5 - 8.1 g/dL   Albumin 3.4 (L) 3.5 - 5.0 g/dL   AST 18 15 - 41 U/L   ALT 10 0 - 44 U/L   Alkaline Phosphatase 74 38 - 126 U/L   Total Bilirubin 0.5 0.3  -  1.2 mg/dL   GFR, Estimated 4 (L) >60 mL/min   Anion gap 16 (H) 5 - 15  Lactic acid, plasma  Result Value Ref Range   Lactic Acid, Venous 1.1 0.5 - 1.9 mmol/L  I-stat chem 8, ED (not at [facility] or [facility])  Result Value Ref Range   Sodium 143 135 - 145 mmol/L   Potassium 3.2 (L) 3.5 - 5.1 mmol/L   Chloride 118 (H) 98 - 111 mmol/L   BUN 66 (H) 6 - 20 mg/dL   Creatinine, Ser 12.50 (H) 0.44 - 1.00 mg/dL   Glucose, Bld 93 70 - 99 mg/dL   Calcium, Ion 0.66 (LL) 1.15 - 1.40 mmol/L   TCO2 12 (L) 22 - 32 mmol/L   Hemoglobin 10.5 (L) 12.0 - 15.0 g/dL   HCT 31.0 (L) 36.0 - 46.0 %   Comment NOTIFIED PHYSICIAN    DG Foot Complete Right  Result Date: 12/31/2021 CLINICAL DATA:  Fall smelling wound. EXAM: RIGHT FOOT COMPLETE - 3+ VIEW COMPARISON:  Radiograph 12/15/2021 FINDINGS: Plantar wound at the level of the midfoot. No deep soft tissue gas extension. No radiopaque foreign body. This appears slightly larger than on prior exam. There is no evidence of subjacent osteomyelitis, no erosions or periostitis. Midfoot changes consistent with Charcot foot. No fracture. IMPRESSION: 1. Plantar wound at the level of the midfoot. No deep soft tissue gas extension. No radiopaque foreign body. 2. No radiographic findings of osteomyelitis. 3. Findings consistent with chronic Charcot foot. Electronically Signed   By: Keith Rake M.D.   On: 12/31/2021 21:24   US Renal  Result Date: 12/27/2021 CLINICAL DATA:  Renal failure. EXAM: RENAL / URINARY TRACT ULTRASOUND COMPLETE COMPARISON:  CT abdomen pelvis dated 12/06/2021. FINDINGS: Right Kidney: Renal measurements: 10.2 x 4.2 x 3.8 cm = volume: 86 mL. There is increased renal parenchymal echogenicity. No hydronephrosis or shadowing stone. Left Kidney: Renal measurements: 11.3 x 5.1 x 3.9 cm = volume: 120 mL. There is increased for renal parenchymal echogenicity. No hydronephrosis or shadowing stone. Bladder: Appears normal for degree of bladder distention. Other: There is a  moderate right pleural effusion. IMPRESSION: 1. Echogenic kidneys in keeping with chronic kidney disease. No hydronephrosis or shadowing stone. 2. Right pleural effusion. Electronically Signed   By: Anner Crete M.D.   On: 12/27/2021 23:00   DG Foot Complete Right  Result Date: 12/15/2021 CLINICAL DATA:  Right foot pain and swelling with wound. EXAM: RIGHT FOOT COMPLETE - 3+ VIEW COMPARISON:  November 05, 2017.  March 11, 2018. FINDINGS: There is again noted extensive bony destruction and fragmentation involving the midfoot most consistent with neuropathic arthropathy. Vascular calcifications are noted. No definite acute fracture is noted. No definite lytic destruction is seen. Gas is seen in the plantar soft tissues consistent with ulceration or wound. 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 Collection of gas is seen in the plantar soft tissues consistent with ulceration or wound. No definite lytic destruction or acute fracture is noted. Electronically Signed   By: Marijo Conception M.D.   On: 12/15/2021 16:24   CT ABDOMEN PELVIS WO CONTRAST  Result Date: 12/06/2021 CLINICAL DATA:  A right lower quadrant abdominal pain. EXAM: CT ABDOMEN AND PELVIS WITHOUT CONTRAST TECHNIQUE: Multidetector CT imaging of the abdomen and pelvis was performed following the standard protocol without IV contrast. RADIATION DOSE REDUCTION: This exam was performed according to the departmental dose-optimization program which includes automated exposure control, adjustment of the mA  and/or kV according to patient size and/or use of iterative reconstruction technique. COMPARISON:  CT stone study 03/04/2015 FINDINGS: Lower chest: Right base collapse/consolidation is associated with at least moderate right pleural effusion. Hepatobiliary: No suspicious focal abnormality in the liver on this study without intravenous contrast. Gallbladder is surgically absent. No  intrahepatic or extrahepatic biliary dilation. Pancreas: No focal mass lesion. No dilatation of the main duct. No intraparenchymal cyst. No peripancreatic edema. Spleen: No splenomegaly. No focal mass lesion. Adrenals/Urinary Tract: No adrenal nodule or mass. Vascular calcification noted in the hilum of each kidney with probable 2 mm nonobstructing stone lower pole right kidney. No evidence for hydroureter. The urinary bladder appears normal for the degree of distention. Stomach/Bowel: Stomach is unremarkable. No gastric wall thickening. No evidence of outlet obstruction. Duodenum is normally positioned as is the ligament of Treitz. No small bowel wall thickening. No small bowel dilatation. The terminal ileum is normal. The appendix is normal. No gross colonic mass. Prominent wall thickness noted in the rectum although this is not well evaluated on noncontrast CT imaging. Vascular/Lymphatic: There is mild atherosclerotic calcification of the abdominal aorta without aneurysm. There is no gastrohepatic or hepatoduodenal ligament lymphadenopathy. No retroperitoneal or mesenteric lymphadenopathy. No pelvic sidewall lymphadenopathy. Reproductive: Unremarkable. Other: No intraperitoneal free fluid. Musculoskeletal: Diffuse body wall edema noted. No worrisome lytic or sclerotic osseous abnormality. IMPRESSION: 1. No acute findings in the abdomen or pelvis. Specifically, no findings to explain the patient's history of right lower quadrant pain. 2. Right base collapse/consolidation with at least moderate right pleural effusion. 3. Diffuse body wall edema. 4. Probable 2 mm nonobstructing stone lower pole right kidney. 5. Aortic Atherosclerosis (ICD10-I70.0). Electronically Signed   By: Misty Stanley M.D.   On: 12/06/2021 20:00    Radiology DG Foot Complete Right  Result Date: 12/31/2021 CLINICAL DATA:  Fall smelling wound. EXAM: RIGHT FOOT COMPLETE - 3+ VIEW COMPARISON:  Radiograph 12/15/2021 FINDINGS: Plantar wound at  the level of the midfoot. No deep soft tissue gas extension. No radiopaque foreign body. This appears slightly larger than on prior exam. There is no evidence of subjacent osteomyelitis, no erosions or periostitis. Midfoot changes consistent with Charcot foot. No fracture. IMPRESSION: 1. Plantar wound at the level of the midfoot. No deep soft tissue gas extension. No radiopaque foreign body. 2. No radiographic findings of osteomyelitis. 3. Findings consistent with chronic Charcot foot. Electronically Signed   By: Keith Rake M.D.   On: 12/31/2021 21:24    Procedures Procedures    Medications Ordered in ED Medications  vancomycin (VANCOCIN) IVPB 1000 mg/200 mL premix (has no administration in time range)  ceFEPIme (MAXIPIME) 2 g in sodium chloride 0.9 % 100 mL IVPB (has no administration in time range)  calcium carbonate (TUMS - dosed in mg elemental calcium) chewable tablet 200 mg of elemental calcium (has no administration in time range)    ED Course/ Medical Decision Making/ A&P                           Medical Decision Making Patient with DM with worsening foot wound   Problems Addressed: AKI (acute kidney injury) ([place]):    Details: Stage four renal failure with worsening function  Wound infection: acute illness or injury    Details: Blood cultures and antibiotics and admission   Amount and/or Complexity of Data Reviewed External Data Reviewed: labs and notes.    Details: Previous notes and labs reviewed  Labs: ordered.  Details: All labs reviewed:normal white count 6.9, low hemoglobin 9.3, normal platelets.  Normal sodium 139, low potassium 3.2, elevated creatinine 12.50, low calcium cultures sent Discussion of management or test interpretation with external provider(s): Dr. Velia Meyer to admit   Risk OTC drugs. Prescription drug management. Decision regarding hospitalization. Risk Details: IV antibiotics and admit     Final Clinical Impression(s) / ED  Diagnoses Final diagnoses:  Wound infection   The patient appears reasonably stabilized for admission considering the current resources, flow, and capabilities available in the ED at this time, and I doubt any other [facility] requiring further screening and/or treatment in the ED prior to admission.   Rx / DC Orders ED Discharge Orders     None         Cindy Brindisi, MD 01/01/22 [phone]

## 2022-01-01 NOTE — ED Notes (Signed)
Provider Dr. Tamala Julian notified that the patient is now being cooperative with the plan of care and USGPIV team has been ordered and patient agrees to have labs collected at this time so long as this RN is in the room.

## 2022-01-01 NOTE — ED Notes (Signed)
Patient allows this RN To order IV team and agrees to have an additional IV placed so long as this RN is in the room while they're placing it. Patient reports that she would like her foot wrapped and verbalizes in front of doctor that she would like treatment and to eventually be able to get dialysis. Patient's foot was cleansed and wrapped with nonadherent gauze and wrapped loosely with an ace wrap. Patient verbalizes understanding of the plan of care and provided with snack and ice at this time per her request.

## 2022-01-01 NOTE — Consult Note (Addendum)
Reason for Consult: Renal failure Referring Physician:  Dr. Fuller Plan  Chief Complaint: Right foot pain  Assessment/Plan: Renal failure - suspicious for chronic progression with a creatinine that was already 9.26 on 10/22/2021. She stated that her PMD is Dr. [name] [phone]) but they do not have any labs outside of what is available on Epic. It appears she was lost to f/u for many years and has had progression of her kidney disease. Really no need to repeat the renal ultrasound as she just had one on 12/27/2021 which showed low volume kidneys, hyperechogenic. Fortunately she doesn't have any absolute indications for dialysis. She was resistant to having a TC to initiate dialysis and kept stating that she wants home dialysis. I think someone (likely primary) has told her about nearing dialysis as she has been looking on Youtube about ESRD and dialysis access. - Hold Lisinopril/HCTZ, Lasix as well. -  HCO3 only 10 and I would recommend initiating dialysis. - Had to return to continue dialysis education; she is now willing to have an access placed now. Will need a permanent access + TC. - Will obtain vein mapping for her; she is left hand dominant.  HTN  - Can start Norvasc 38m daily for now; UF with RRT will help as well Renal osteodystrophy - Will check a phos, 25VitD and iPTH; likely vit D deficient as well. Foot wound - per primary DM   HPI: OFonda Bradley an 56y.Natalie. female DM, obesity, HTN, CKD unknown stage, presenting with a wound in her right foot. She actually was trying to shave a callous a few weeks ago and ended up cutting herself. She was seen in the ED recently and sent home with Clindamycin. Wound cultures grew out Staph warnerii, CNS, and Strep anginosis; unfortunately the wound did not improve and she came back to the ED with foul smelling drainage. She denies any fever, chills, nausea, vomiting. She denies any NSAID use. Xrays showed chronic Charcot foot and the  patient was started on Vancomycin and Cefepime. Creatinine was noted to be 12.5 initially and has been elevated for over 2 mths; Creatinine was 9.26 10/22/2021 and prior to that the last labs were on 03/11/2018 w/ a creatinine of 1.31.    ROS Pertinent items are noted in HPI.  Chemistry and CBC: Creatinine, Ser  Date/Time Value Ref Range Status  01/01/2022 05:42 AM 10.93 (H) 0.44 - 1.00 mg/dL Final  01/01/2022 05:42 AM 12.50 (H) 0.44 - 1.00 mg/dL Final  12/27/2021 08:15 PM 10.38 (H) 0.44 - 1.00 mg/dL Final  12/06/2021 01:13 PM 10.98 (H) 0.44 - 1.00 mg/dL Final  10/22/2021 11:25 AM 9.26 (H) 0.57 - 1.00 mg/dL Final  03/11/2018 10:20 AM 1.31 (H) 0.44 - 1.00 mg/dL Final  11/03/2017 04:02 PM 1.35 (H) 0.57 - 1.00 mg/dL Final  08/11/2015 03:43 PM 0.91 0.44 - 1.00 mg/dL Final  06/20/2015 01:50 AM 1.05 (H) 0.44 - 1.00 mg/dL Final  03/05/2015 09:00 AM 0.83 0.44 - 1.00 mg/dL Final  03/04/2015 04:26 AM 0.78 0.44 - 1.00 mg/dL Final  03/03/2015 09:03 PM 1.24 (H) 0.44 - 1.00 mg/dL Final  09/08/2014 01:40 PM 0.80 0.44 - 1.00 mg/dL Final  07/29/2014 09:04 PM 0.80 0.44 - 1.00 mg/dL Final  07/29/2014 02:27 PM 1.05 (H) 0.44 - 1.00 mg/dL Final  03/08/2014 01:25 PM 0.76 0.50 - 1.10 mg/dL Final  10/04/2013 06:37 PM 0.54 0.50 - 1.10 mg/dL Final  06/25/2011 03:50 AM 0.43 (L) 0.50 - 1.10 mg/dL Final  06/24/2011 09:51 PM  0.46 (L) 0.50 - 1.10 mg/dL Final  04/26/2011 04:54 PM 0.55 0.50 - 1.10 mg/dL Final  04/26/2011 12:22 PM 0.50 0.50 - 1.10 mg/dL Final  04/26/2010 11:47 PM 0.87 0.4 - 1.2 mg/dL Final  06/13/2009 11:00 PM 0.54 0.4 - 1.2 mg/dL Final  02/23/2009 06:37 PM 0.53 0.4 - 1.2 mg/dL Final  10/05/2008 06:20 AM 0.52 0.4 - 1.2 mg/dL Final  10/04/2008 02:00 AM 0.58 0.4 - 1.2 mg/dL Final  06/18/2007 09:08 AM 0.56  Final  11/18/2006 04:20 PM 0.62  Final   Recent Labs  Lab 12/27/21 2015 01/01/22 0542  NA 140 139  143  K 2.8* 3.2*  3.2*  CL 113* 113*  118*  CO2 13* 10*  GLUCOSE 101* 96  93  BUN 59*  68*  66*  CREATININE 10.38* 10.93*  12.50*  CALCIUM 5.5* 5.8*   Recent Labs  Lab 12/27/21 2015 01/01/22 0542  WBC 5.8 6.9  NEUTROABS 3.6 4.5  HGB 8.2* 9.3*  10.5*  HCT 26.1* 29.1*  31.0*  MCV 90.0 90.7  PLT 278 277   Liver Function Tests: Recent Labs  Lab 12/27/21 2015 01/01/22 0542  AST 16 18  ALT 9 10  ALKPHOS 73 74  BILITOT 0.5 0.5  PROT 7.2 8.0  ALBUMIN 3.0* 3.4*   No results for input(s): "LIPASE", "AMYLASE" in the last 168 hours. No results for input(s): "AMMONIA" in the last 168 hours. Cardiac Enzymes: No results for input(s): "CKTOTAL", "CKMB", "CKMBINDEX", "TROPONINI" in the last 168 hours. Iron Studies: No results for input(s): "IRON", "TIBC", "TRANSFERRIN", "FERRITIN" in the last 72 hours. PT/INR: _0 (inr:5)  Xrays/Other Studies: ) Results for orders placed or performed during the hospital encounter of 12/31/21 (from the past 48 hour(s))  CBC with Differential     Status: Abnormal   Collection Time: 01/01/22  5:42 AM  Result Value Ref Range   WBC 6.9 4.0 - 10.5 K/uL   RBC 3.21 (L) 3.87 - 5.11 MIL/uL   Hemoglobin 9.3 (L) 12.0 - 15.0 g/dL   HCT 29.1 (L) 36.0 - 46.0 %   MCV 90.7 80.0 - 100.0 fL   MCH 29.0 26.0 - 34.0 pg   MCHC 32.0 30.0 - 36.0 g/dL   RDW 15.1 11.5 - 15.5 %   Platelets 277 150 - 400 K/uL   nRBC 0.0 0.0 - 0.2 %   Neutrophils Relative % 65 %   Neutro Abs 4.5 1.7 - 7.7 K/uL   Lymphocytes Relative 28 %   Lymphs Abs 1.9 0.7 - 4.0 K/uL   Monocytes Relative 5 %   Monocytes Absolute 0.3 0.1 - 1.0 K/uL   Eosinophils Relative 1 %   Eosinophils Absolute 0.1 0.0 - 0.5 K/uL   Basophils Relative 1 %   Basophils Absolute 0.0 0.0 - 0.1 K/uL   Immature Granulocytes 0 %   Abs Immature Granulocytes 0.03 0.00 - 0.07 K/uL    Comment: Performed at [facility], [address], [place], [place] [address]  Comprehensive metabolic panel     Status: Abnormal   Collection Time: 01/01/22  5:42 AM  Result Value Ref Range   Sodium 139 135  - 145 mmol/L   Potassium 3.2 (L) 3.5 - 5.1 mmol/L   Chloride 113 (H) 98 - 111 mmol/L   CO2 10 (L) 22 - 32 mmol/L   Glucose, Bld 96 70 - 99 mg/dL    Comment: Glucose reference range applies only to samples taken after fasting for at least 8 hours.  BUN 68 (H) 6 - 20 mg/dL   Creatinine, Ser 10.93 (H) 0.44 - 1.00 mg/dL   Calcium 5.8 (LL) 8.9 - 10.3 mg/dL    Comment: CRITICAL RESULT CALLED TO, READ BACK BY AND VERIFIED WITH B. BECK, RN AT 0645 ON 01/01/22 BY H. HOWARD.   Total Protein 8.0 6.5 - 8.1 g/dL   Albumin 3.4 (L) 3.5 - 5.0 g/dL   AST 18 15 - 41 U/L   ALT 10 0 - 44 U/L   Alkaline Phosphatase 74 38 - 126 U/L   Total Bilirubin 0.5 0.3 - 1.2 mg/dL   GFR, Estimated 4 (L) >60 mL/min    Comment: (NOTE) Calculated using the CKD-EPI Creatinine Equation (2021)    Anion gap 16 (H) 5 - 15    Comment: Performed at [facility] [address] [place], [place] [address]  I-stat chem 8, ED (not at [facility] or [facility])     Status: Abnormal   Collection Time: 01/01/22  5:42 AM  Result Value Ref Range   Sodium 143 135 - 145 mmol/L   Potassium 3.2 (L) 3.5 - 5.1 mmol/L   Chloride 118 (H) 98 - 111 mmol/L   BUN 66 (H) 6 - 20 mg/dL   Creatinine, Ser 12.50 (H) 0.44 - 1.00 mg/dL   Glucose, Bld 93 70 - 99 mg/dL    Comment: Glucose reference range applies only to samples taken after fasting for at least 8 hours.   Calcium, Ion 0.66 (LL) 1.15 - 1.40 mmol/L   TCO2 12 (L) 22 - 32 mmol/L   Hemoglobin 10.5 (L) 12.0 - 15.0 g/dL   HCT 31.0 (L) 36.0 - 46.0 %   Comment NOTIFIED PHYSICIAN   Lactic acid, plasma     Status: None   Collection Time: 01/01/22  5:43 AM  Result Value Ref Range   Lactic Acid, Venous 1.1 0.5 - 1.9 mmol/L    Comment: Performed at [facility] [address] [place], [place] [address]  Sodium, urine, random     Status: None   Collection Time: 01/01/22  9:08 AM  Result Value Ref Range   Sodium, Ur 73 mmol/L    Comment: Performed at [facility] [address]  [place], [place] [address]  Creatinine, urine, random     Status: None   Collection Time: 01/01/22  9:08 AM  Result Value Ref Range   Creatinine, Urine 49 mg/dL    Comment: Performed at [facility] [address] [place], [place] [address]  Protein, urine, random     Status: None   Collection Time: 01/01/22 10:42 AM  Result Value Ref Range   Total Protein, Urine 226 mg/dL    Comment: RESULT CONFIRMED BY MANUAL DILUTION NO NORMAL RANGE ESTABLISHED FOR THIS TEST Performed at [facility], [place] [address], [place], [place] [address]    DG Foot Complete Right  Result Date: 12/31/2021 CLINICAL DATA:  Fall smelling wound. EXAM: RIGHT FOOT COMPLETE - 3+ VIEW COMPARISON:  Radiograph 12/15/2021 FINDINGS: Plantar wound at the level of the midfoot. No deep soft tissue gas extension. No radiopaque foreign body. This appears slightly larger than on prior exam. There is no evidence of subjacent osteomyelitis, no erosions or periostitis. Midfoot changes consistent with Charcot foot. No fracture. IMPRESSION: 1. Plantar wound at the level of the midfoot. No deep soft tissue gas extension. No radiopaque foreign body. 2. No radiographic findings of osteomyelitis. 3. Findings consistent with chronic  Charcot foot. Electronically Signed   By: Keith Rake M.D.   On: 12/31/2021 21:24    PMH:   Past Medical History:  Diagnosis Date   Asthma    Child   Diabetes mellitus    Obesity     PSH:   Past Surgical History:  Procedure Laterality Date   CHOLECYSTECTOMY     TUBAL LIGATION      Allergies:  Allergies  Allergen Reactions   Penicillins Rash    Medications:   Prior to Admission medications   Medication Sig Start Date End Date Taking? Authorizing Provider  albuterol (VENTOLIN HFA) 108 (90 Base) MCG/ACT inhaler Inhale 1-2 puffs into the lungs every 6 (six) hours as needed for wheezing or shortness of breath. Patient taking differently: Inhale 1-2 puffs into the lungs daily as needed for  wheezing or shortness of breath. 07/19/21  Yes [name], MD  furosemide (LASIX) 20 MG tablet As needed 1 daily prn for leg swelling Patient taking differently: Take 20 mg by mouth daily. 09/11/21  Yes [name], MD  ibuprofen (ADVIL) 600 MG tablet Take 1 tablet (600 mg total) by mouth every 8 (eight) hours as needed for moderate pain. 02/06/20  Yes Bast, Traci A, NP  lisinopril-hydrochlorothiazide (ZESTORETIC) 20-25 MG tablet Take 1 tablet by mouth daily. 07/19/21  Yes [name], MD  metFORMIN (GLUCOPHAGE) 850 MG tablet Take 1 tablet (850 mg total) by mouth 2 (two) times daily with a meal. 07/19/21  Yes [name], MD  Blood Glucose Monitoring Suppl (TRUE METRIX METER) w/Device KIT Use to check blood sugar 3 times daily 11/03/17   [name], [name], MD  clindamycin (CLEOCIN) 300 MG capsule Take 1 capsule (300 mg total) by mouth 3 (three) times daily. Patient not taking: Reported on 01/01/2022 12/15/21   [name], [name], [name]  fluticasone-salmeterol (ADVAIR) 500-50 MCG/ACT AEPB Inhale 1 puff into the lungs in the morning and at bedtime. Patient not taking: Reported on 01/01/2022 07/19/21   [name], MD  glipiZIDE (GLIPIZIDE XL) 10 MG 24 hr tablet Take 1 tablet (10 mg total) by mouth daily with breakfast. To lower blood sugar Patient not taking: Reported on 01/01/2022 07/19/21   [name], MD  glucose blood (TRUE METRIX BLOOD GLUCOSE TEST) test strip Use to check blood sugar three times per day 11/03/17   [name], Ander Gaster, MD  trimethoprim-polymyxin b (POLYTRIM) ophthalmic solution Place 1 drop into the left eye every 6 (six) hours. Patient not taking: Reported on 01/01/2022 02/22/21   [name], MD  TRUEPLUS LANCETS 28G MISC Use to check blood sugar 3 times daily 11/03/17   [name], MD    Discontinued Meds:  There are no discontinued medications.  Social History:  reports that she has quit smoking. Her smoking use included cigarettes. She has never used smokeless tobacco. She  reports current alcohol use. She reports that she does not use drugs.  Family History:   Family History  Problem Relation Age of Onset   Diabetes Mother     Blood pressure (!) 161/92, pulse 82, temperature 97.7 F (36.5 C), temperature source Oral, resp. rate (!) 22, height _0  (1.676 m), weight 108.9 kg, last menstrual period 05/26/2017, SpO2 100 %. General appearance: alert, cooperative, and appears stated age Head: Normocephalic, without obvious abnormality, atraumatic Eyes: negative Neck: no adenopathy, no carotid bruit, no JVD, supple, symmetrical, trachea midline, and thyroid not enlarged, symmetric, no tenderness/mass/nodules Back: symmetric, no curvature. ROM normal. No CVA tenderness. Resp: clear to  auscultation bilaterally Cardio: regular rate and rhythm GI: soft, non-tender; bowel sounds normal; no masses,  no organomegaly Extremities: edema woody; wound in bottom of right foot Pulses: 2+ and symmetric Skin: Skin color, texture, turgor normal. No rashes or lesions       [name], Hunt Oris, MD 01/01/2022, 12:15 PM

## 2022-01-01 NOTE — Progress Notes (Signed)
  Carryover admission to the Day Admitter.  I discussed this case with the EDP, Dr.  Randal Buba.  Per these discussions:   This is a 56 year old female with a history of stage IIIb/IV CKD, who is being admitted with cellulitis of the right foot in the setting of a wound on the fifth metatarsal at this visit, in the setting of failure of outpatient antibiotics, including Keflex.   She reportedly was started on antibiotics, including Keflex urgent care several days ago for a right fifth metatarsal wound.  However, in spite of reported good interval compliance with these antibiotics, she noted further progression of the associated erythema on the right foot, prompting her ultimately presented to [facility] emergency department this evening for further evaluation and management thereof.  Additionally, over the last several weeks, she has had further worsening of her serum creatinine, now in the range of 10-12, potassium 3.2.  3 weeks ago, her creatinine was noted also to be 10.  She continues to produce urine, although the volume of which is clear at this point  She was started on IV vancomycin as well as cefepime in the ED for right lower extremity cellulitis.  I have placed an order for inpatient admission to PCU for further evaluation and management of the above.  Of note, the case has not been discussed with nephrology.    Babs Bertin, DO Hospitalist

## 2022-01-01 NOTE — ED Notes (Signed)
Pt transported to MRI 

## 2022-01-01 NOTE — H&P (Addendum)
History and Physical    Patient: [name] OEV:[id number] DOB: 1966/01/25 DOA: 12/31/2021 DOS: the patient was seen and examined on 01/01/2022 PCP: [name], MD  Patient coming from: Home  Chief Complaint:  Chief Complaint  Patient presents with   Wound Infection   HPI: [name] is a 56 y.o. female with medical history significant of diabetes mellitus type 2, CKD stage V, and obesity who presents for worsening wound of her right foot.  She has been seen in in the emergency department on 10/21 with report of wound being present for several weeks after reporting something going through her shoe and injured her foot.  She had been cleaning it and it seemed to be improving initially.  During that visit x-rays of the foot did not note any volar involvement.  She had been sent home with clindamycin which she completed.  Wound cultures noted Staphylococcus warneri, Streptococcus anginosis, and Staphylococcus species.  Since that time patient reports that he has been placing ointment on it and the wound did not seem to be improving.  Denies any fever, chills, nausea, or vomiting.  She reports foul-smelling drainage coming from the wound and reports some diarrhea.  She reports that she has not been taking ibuprofen, but has been on Lasix 20 mg daily as well as lisinopril-hydrochlorothiazide 10-25 mg daily.   In the emergency department patient was noted to be afebrile with tachycardia and blood pressure was elevated up to 159/92.  Labs revealed hemoglobin 9.3, potassium 3.2, CO2 10 BUN 68, creatinine 10.93, anion gap 16, calcium 5.8, and lactic acid 1.1.  X-rays of the right foot noted to the plantar aspect of the foot with signs of bone involvement for which findings were consistent with chronic Charcot foot.  Blood cultures have been obtained.  Patient has been given vancomycin, cefepime, and calcium carbonate 200 mg p.o.  Review of Systems: Unable to review all systems due to lack of  cooperation from patient. Past Medical History:  Diagnosis Date   Asthma    Child   Diabetes mellitus    Obesity    Past Surgical History:  Procedure Laterality Date   CHOLECYSTECTOMY     TUBAL LIGATION     Social History:  reports that she has quit smoking. Her smoking use included cigarettes. She has never used smokeless tobacco. She reports current alcohol use. She reports that she does not use drugs.  Allergies  Allergen Reactions   Penicillins Rash    Family History  Problem Relation Age of Onset   Diabetes Mother     Prior to Admission medications   Medication Sig Start Date End Date Taking? Authorizing Provider  albuterol (VENTOLIN HFA) 108 (90 Base) MCG/ACT inhaler Inhale 1-2 puffs into the lungs every 6 (six) hours as needed for wheezing or shortness of breath. 07/19/21   [name], MD  Blood Glucose Monitoring Suppl (TRUE METRIX METER) w/Device KIT Use to check blood sugar 3 times daily 11/03/17   [name], [name], MD  clindamycin (CLEOCIN) 300 MG capsule Take 1 capsule (300 mg total) by mouth 3 (three) times daily. 12/15/21   [name], [name], [name]  fluticasone-salmeterol (ADVAIR) 500-50 MCG/ACT AEPB Inhale 1 puff into the lungs in the morning and at bedtime. 07/19/21   [name], MD  furosemide (LASIX) 20 MG tablet As needed 1 daily prn for leg swelling 09/11/21   [name], MD  glipiZIDE (GLIPIZIDE XL) 10 MG 24 hr tablet Take 1 tablet (10 mg total) by mouth  daily with breakfast. To lower blood sugar 07/19/21   [name], MD  glucose blood (TRUE METRIX BLOOD GLUCOSE TEST) test strip Use to check blood sugar three times per day 11/03/17   [name], [name], MD  ibuprofen (ADVIL) 600 MG tablet Take 1 tablet (600 mg total) by mouth every 8 (eight) hours as needed for moderate pain. 02/06/20   [name], NP  lisinopril-hydrochlorothiazide (ZESTORETIC) 20-25 MG tablet Take 1 tablet by mouth daily. 07/19/21   [name], MD  metFORMIN (GLUCOPHAGE) 850 MG tablet Take  1 tablet (850 mg total) by mouth 2 (two) times daily with a meal. 07/19/21   [name], MD  trimethoprim-polymyxin b (POLYTRIM) ophthalmic solution Place 1 drop into the left eye every 6 (six) hours. 02/22/21   [name], MD  TRUEPLUS LANCETS 28G MISC Use to check blood sugar 3 times daily 11/03/17   [name], MD    Physical Exam: Vitals:   12/31/21 2258 01/01/22 0143 01/01/22 0530 01/01/22 0542  BP: (!) 140/81 (!) 155/92 (!) 159/92   Pulse: 77 (!) 146 78   Resp: _0 Temp:  97.7 F (36.5 C) 97.8 F (36.6 C)   TempSrc:   Oral   SpO2: 100% 100% 100%   Weight:    108.9 kg  Height:    _1  (1.676 m)   Constitutional: Middle-aged woman appears to be in no acute distress Eyes: PERRL, lids and conjunctivae normal ENMT: Mucous membranes are dry. Neck: normal, supple  Respiratory: clear to auscultation bilaterally, no wheezing, no crackles. Normal respiratory effort. No accessory muscle use.  Cardiovascular: Regular rate and rhythm, no murmurs / rubs / gallops.  Swelling noted of the bilateral lower extremities. Abdomen: no tenderness, no masses palpated.  Bowel sounds positive.  Musculoskeletal: no clubbing / cyanosis.   Skin: Laceration open wound on the plantar aspect of the mid right foot with foul-smelling drainage present. Neurologic: CN 2-12 grossly intact. Strength 5/5 in all 4.  Psychiatric: Normal judgment and insight. Alert and oriented x 3. Normal mood.   Data Reviewed:  Reviewed labs, imaging, and pertinent records as noted above. EKG revealed sinus rhythm at 80 bpm with QTc 513.  Assessment and Plan: Diabetic foot wound infection Acute.  Patient presented with complaints of worsening wound of the right foot over the last  2 weeks after cutting it on equipment for her feet.  X-rays of the foot noted worsening wound without clear signs of bone involvement. -Admit to a telemetry bed -Follow-up blood cultures -Check ESR and CRP -Check ABI with  TBI -MRI of the right foot per podiatry -Continue empiric antibiotics of vancomycin and cefepime -Dr. Sherryle Lis of podiatry consulted, will follow-up for any further recommendations  Hypocalcemia Acute on chronic.  Calcium 5.8 on admission with ionized calcium 0.66.  Patient had been ordered calcium carbonate 200 mg p.o. -Give 4 g of calcium gluconate IV   -Continue to monitor and replace as needed  Hypokalemia Acute.   Potassium 3.2 on admission, but appears to have been low.  Patient on diuretics as a possible cause. -Give potassium chloride 20 mEq p.o.  CKD stage V Creatinine elevated up to 10.93 with BUN 68.  Creatinine had been noted to be 9 at the end of August.  Urinalysis from 11/2 noted small hemoglobin with greater than 300 protein.  Recent CT of the abdomen and pelvis from 10/12 that noted no acute findings with diffuse body wall edema, and probable 2  mm nonobstructing stone in the lower pole of the right kidney.  Question if worsening kidney function related with medications, proteinuria, diarrhea, and/or progression of chronic kidney disease. -Check urine sodium, creatinine, urea, and protein -Avoid nephrotoxic agents -Nephrology consulted, will follow-up for any further recommendation since  Metabolic acidosis with elevated anion gap On admission CO2 10 with anion gap of 16.  Thought likely secondary to patient's worsening kidney function as glucose 96. -Will likely need to be placed on sodium bicarb  Diarrhea Patient reports having diarrhea and recently been placed on clindamycin. -Monitor intake and output -May warrant checking C. difficile if diarrhea persist  Essential hypertension Blood pressures initially elevated up to 159/92. -Held furosemide and lisinopril-hydrochlorothiazide due to kidney function -Hydralazine IV as needed  Controlled diabetes mellitus type 2, without long-term use of insulin Hemoglobin A1c was 6 on 12/21/2021 -Hypoglycemic protocol -Hold  glipizide and metformin -Renal and carb modified diet -Consider placing on sensitive SSI if blood sugars noted to increase.  Obesity BMI 38.74 kg/m Advance Care Planning:   Code Status: Full Code   Consults: Podiatry and nephrology  Family Communication: None requested  Severity of Illness: The appropriate patient status for this patient is INPATIENT. Inpatient status is judged to be reasonable and necessary in order to provide the required intensity of service to ensure the patient's safety. The patient's presenting symptoms, physical exam findings, and initial radiographic and laboratory data in the context of their chronic comorbidities is felt to place them at high risk for further clinical deterioration. Furthermore, it is not anticipated that the patient will be medically stable for discharge from the hospital within 2 midnights of admission.   * I certify that at the point of admission it is my clinical judgment that the patient will require inpatient hospital care spanning beyond 2 midnights from the point of admission due to high intensity of service, high risk for further deterioration and high frequency of surveillance required.*  Author: Norval Morton, MD 01/01/2022 7:21 AM  For on call review [email or web address].

## 2022-01-01 NOTE — ED Notes (Signed)
Vasculature provider at bedside at this time to perform assessment

## 2022-01-01 NOTE — Progress Notes (Addendum)
Patient reports that she does not want to go on hemodialysis at this time and declines central access to give IV medications and blood draws.  IR consult for central access discontinued.  Advised patient that ultimately declining hemodialysis would lead to death.  Patient reports that we all die at some point in time.  Advised patient that we could have palliative care consulted to discuss goals of care as ultimately it is her right to refuse treatments.  After talks with nephrology patient was amenable to having catheters placed and being evaluated by vascular surgery for need of starting hemodialysis. Palliative care consult was discontinued at that time.

## 2022-01-01 NOTE — Telephone Encounter (Signed)
Reason for Disposition  Health Information question, no triage required and triager able to answer question  Answer Assessment - Initial Assessment Questions 1. REASON FOR CALL or QUESTION: "What is your reason for calling today?" or "How can I best help you?" or "What question do you have that I can help answer?"     Dr. Dian Situ, nephrology calling in from the ED inquiring about creatinine for this pt because she is there in the ED.    There is a period of time where there isn't a record of the pt's care.  3 year period of time no listing of medical care through [facility].    I wasn't able to give him any extra information other than what was in Epic.  Protocols used: Information Only Call - No Triage-A-AH

## 2022-01-01 NOTE — ED Notes (Signed)
Ambulates to the restroom at this time with 1 assist

## 2022-01-01 NOTE — Progress Notes (Signed)
Pharmacy Antibiotic Note  [name] is a 56 y.o. female admitted on 12/31/2021 with Cellulitis.  Pharmacy has been consulted for Vancomycin and cefepime dosing.  Patient with baseline CKD4 - current CrCl <10 mL/min  Plan: Vancomycin 2000mg  IV once - further doses pending random levels and renal function Cefepime 1g Q24H F/u cultures for therapy de-escalation  Height: 5\' 6"  (167.6 cm) Weight: 108.9 kg (240 lb) IBW/kg (Calculated) : 59.3  Temp (24hrs), Avg:97.9 F (36.6 C), Min:97.7 F (36.5 C), Max:98.1 F (36.7 C)  Recent Labs  Lab 12/27/21 2015 01/01/22 0542 01/01/22 0543  WBC 5.8 6.9  --   CREATININE 10.38* 10.93*  12.50*  --   LATICACIDVEN  --   --  1.1    Estimated Creatinine Clearance: 7.3 mL/min (A) (by C-G formula based on SCr of 10.93 mg/dL (H)).    Allergies  Allergen Reactions   Penicillins Rash    Antimicrobials this admission: Vanc 11/7 >> Cefepime 11/7 >>  Microbiology results: 11/7 Bcx: sent  Thank you for allowing pharmacy to be a part of this patient's care.  Merrilee Jansky, PharmD Clinical Pharmacist 01/01/2022 8:29 AM

## 2022-01-01 NOTE — Consult Note (Signed)
Reason for Consult: Ulcer of left foot Referring Physician: Dr. Blondell Reveal is an 56 y.o. female.  HPI: She was scheduled to see me last week in the office but did not make her appointment.  She says she cannot tell me how long the wound has been there.  She says that "something needs to happen and it needs to be taken care of".  She says she got an MRI yesterday at urgent care and then she was post get antibiotics so she is not sure why she is here.  I discussed with her she did not have an MRI she had an x-ray and this did not show any signs of infection but we do need an MRI to evaluate if there is any abscess or bone infection in the heel.  Past Medical History:  Diagnosis Date   Asthma    Child   Diabetes mellitus    Obesity     Past Surgical History:  Procedure Laterality Date   CHOLECYSTECTOMY     TUBAL LIGATION      Family History  Problem Relation Age of Onset   Diabetes Mother     Social History:  reports that she has quit smoking. Her smoking use included cigarettes. She has never used smokeless tobacco. She reports current alcohol use. She reports that she does not use drugs.  Allergies:  Allergies  Allergen Reactions   Penicillins Rash    Medications: I have reviewed the patient's current medications.  Results for orders placed or performed during the hospital encounter of 12/31/21 (from the past 48 hour(s))  CBC with Differential     Status: Abnormal   Collection Time: 01/01/22  5:42 AM  Result Value Ref Range   WBC 6.9 4.0 - 10.5 K/uL   RBC 3.21 (L) 3.87 - 5.11 MIL/uL   Hemoglobin 9.3 (L) 12.0 - 15.0 g/dL   HCT 29.1 (L) 36.0 - 46.0 %   MCV 90.7 80.0 - 100.0 fL   MCH 29.0 26.0 - 34.0 pg   MCHC 32.0 30.0 - 36.0 g/dL   RDW 15.1 11.5 - 15.5 %   Platelets 277 150 - 400 K/uL   nRBC 0.0 0.0 - 0.2 %   Neutrophils Relative % 65 %   Neutro Abs 4.5 1.7 - 7.7 K/uL   Lymphocytes Relative 28 %   Lymphs Abs 1.9 0.7 - 4.0 K/uL   Monocytes Relative 5  %   Monocytes Absolute 0.3 0.1 - 1.0 K/uL   Eosinophils Relative 1 %   Eosinophils Absolute 0.1 0.0 - 0.5 K/uL   Basophils Relative 1 %   Basophils Absolute 0.0 0.0 - 0.1 K/uL   Immature Granulocytes 0 %   Abs Immature Granulocytes 0.03 0.00 - 0.07 K/uL    Comment: Performed at [facility], [address] [place], [place] [address]  Comprehensive metabolic panel     Status: Abnormal   Collection Time: 01/01/22  5:42 AM  Result Value Ref Range   Sodium 139 135 - 145 mmol/L   Potassium 3.2 (L) 3.5 - 5.1 mmol/L   Chloride 113 (H) 98 - 111 mmol/L   CO2 10 (L) 22 - 32 mmol/L   Glucose, Bld 96 70 - 99 mg/dL    Comment: Glucose reference range applies only to samples taken after fasting for at least 8 hours.   BUN 68 (H) 6 - 20 mg/dL   Creatinine, Ser 10.93 (H) 0.44 - 1.00 mg/dL   Calcium 5.8 (  LL) 8.9 - 10.3 mg/dL    Comment: CRITICAL RESULT CALLED TO, READ BACK BY AND VERIFIED WITH B. BECK, RN AT 0645 ON 01/01/22 BY H. HOWARD.   Total Protein 8.0 6.5 - 8.1 g/dL   Albumin 3.4 (L) 3.5 - 5.0 g/dL   AST 18 15 - 41 U/L   ALT 10 0 - 44 U/L   Alkaline Phosphatase 74 38 - 126 U/L   Total Bilirubin 0.5 0.3 - 1.2 mg/dL   GFR, Estimated 4 (L) >60 mL/min    Comment: (NOTE) Calculated using the CKD-EPI Creatinine Equation (2021)    Anion gap 16 (H) 5 - 15    Comment: Performed at [facility] [address], [place], [place] [address]  I-stat chem 8, ED (not at [facility] or [facility])     Status: Abnormal   Collection Time: 01/01/22  5:42 AM  Result Value Ref Range   Sodium 143 135 - 145 mmol/L   Potassium 3.2 (L) 3.5 - 5.1 mmol/L   Chloride 118 (H) 98 - 111 mmol/L   BUN 66 (H) 6 - 20 mg/dL   Creatinine, Ser 12.50 (H) 0.44 - 1.00 mg/dL   Glucose, Bld 93 70 - 99 mg/dL    Comment: Glucose reference range applies only to samples taken after fasting for at least 8 hours.   Calcium, Ion 0.66 (LL) 1.15 - 1.40 mmol/L   TCO2 12 (L) 22 - 32 mmol/L   Hemoglobin 10.5 (L) 12.0 - 15.0 g/dL   HCT  31.0 (L) 36.0 - 46.0 %   Comment NOTIFIED PHYSICIAN   Lactic acid, plasma     Status: None   Collection Time: 01/01/22  5:43 AM  Result Value Ref Range   Lactic Acid, Venous 1.1 0.5 - 1.9 mmol/L    Comment: Performed at [facility] [address], [place], [place] [address]  Sodium, urine, random     Status: None   Collection Time: 01/01/22  9:08 AM  Result Value Ref Range   Sodium, Ur 73 mmol/L    Comment: Performed at [facility] [address] [place], [place] [address]  Creatinine, urine, random     Status: None   Collection Time: 01/01/22  9:08 AM  Result Value Ref Range   Creatinine, Urine 49 mg/dL    Comment: Performed at [facility] [address], [place], [place] [address]  Protein, urine, random     Status: None   Collection Time: 01/01/22 10:42 AM  Result Value Ref Range   Total Protein, Urine 226 mg/dL    Comment: RESULT CONFIRMED BY MANUAL DILUTION NO NORMAL RANGE ESTABLISHED FOR THIS TEST Performed at [facility], [place] [address] [place], [place] [address]     DG Foot Complete Right  Result Date: 12/31/2021 CLINICAL DATA:  Fall smelling wound. EXAM: RIGHT FOOT COMPLETE - 3+ VIEW COMPARISON:  Radiograph 12/15/2021 FINDINGS: Plantar wound at the level of the midfoot. No deep soft tissue gas extension. No radiopaque foreign body. This appears slightly larger than on prior exam. There is no evidence of subjacent osteomyelitis, no erosions or periostitis. Midfoot changes consistent with Charcot foot. No fracture. IMPRESSION: 1. Plantar wound at the level of the midfoot. No deep soft tissue gas extension. No radiopaque foreign body. 2. No radiographic findings of osteomyelitis. 3. Findings consistent with chronic Charcot foot. Electronically Signed   By: Keith Rake M.D.   On: 12/31/2021 21:24    Review  of Systems  Constitutional:  Positive for chills.  Musculoskeletal:        Swelling and pain in right heel  Skin:        Ulcer of  right foot  All other systems reviewed and are negative.  Blood pressure (!) 161/92, pulse 82, temperature 97.7 F (36.5 C), temperature source Oral, resp. rate (!) 22, height 5\' 6"  (1.676 m), weight 108.9 kg, last menstrual period 05/26/2017, SpO2 100 %.  Vitals:   01/01/22 0917 01/01/22 1030  BP:  (!) 161/92  Pulse:  82  Resp:  (!) 22  Temp: 97.7 F (36.5 C)   SpO2:  100%    General AA&O x3. Normal mood and affect.  Vascular Foot is warm and well-perfused I am unable to palpate her pulses secondary to edema  Neurologic Epicritic sensation grossly absent.  Dermatologic (Wound) Full-thickness ulceration at the level of the calcaneocuboid joint plantar, she has malodorous purulent drainage.  Necrotic tissue present  Orthopedic: Motor intact BLE.  Gross planus deformity of foot    Assessment/Plan:  Charcot arthropathy, diabetic foot infection, ulcer of right foot, possible osteomyelitis -Imaging: Studies independently reviewed.  I reviewed the x-ray from yesterday.  I do think an MRI is necessary to evaluate for deeper abscess and/or possible osteomyelitis.  This has been ordered -ABI has been ordered -Antibiotics: Recommend broad-spectrum antibiotics Vanco cefepime and Flagyl for diabetic foot infection.  Pharm consult recommended -WB Status: WBAT -Wound Care: Gentamicin gauze dressings, change every shift -Surgical Plan: Pending results of ABI and MRI.  No current surgical plans.  Does not need to be n.p.o. for now  [name] 01/01/2022, 12:58 PM   Best available via secure chat for questions or concerns.

## 2022-01-02 ENCOUNTER — Inpatient Hospital Stay (HOSPITAL_COMMUNITY): Payer: Self-pay

## 2022-01-02 ENCOUNTER — Encounter (HOSPITAL_COMMUNITY): Payer: Self-pay | Admitting: Internal Medicine

## 2022-01-02 DIAGNOSIS — L97513 Non-pressure chronic ulcer of other part of right foot with necrosis of muscle: Secondary | ICD-10-CM

## 2022-01-02 DIAGNOSIS — L089 Local infection of the skin and subcutaneous tissue, unspecified: Secondary | ICD-10-CM

## 2022-01-02 DIAGNOSIS — E11628 Type 2 diabetes mellitus with other skin complications: Secondary | ICD-10-CM

## 2022-01-02 DIAGNOSIS — N186 End stage renal disease: Secondary | ICD-10-CM

## 2022-01-02 LAB — BLOOD CULTURE ID PANEL (REFLEXED) - BCID2

## 2022-01-02 LAB — CBC
HCT: 21.8 % — ABNORMAL LOW (ref 36.0–46.0)
Hemoglobin: 6.8 g/dL — CL (ref 12.0–15.0)
MCH: 28.7 pg (ref 26.0–34.0)
MCHC: 31.2 g/dL (ref 30.0–36.0)
MCV: 92 fL (ref 80.0–100.0)
Platelets: 222 10*3/uL (ref 150–400)
RBC: 2.37 MIL/uL — ABNORMAL LOW (ref 3.87–5.11)
RDW: 15.3 % (ref 11.5–15.5)
WBC: 4.9 10*3/uL (ref 4.0–10.5)
nRBC: 0.6 % — ABNORMAL HIGH (ref 0.0–0.2)

## 2022-01-02 LAB — PARATHYROID HORMONE, INTACT (NO CA): PTH: 281 pg/mL — ABNORMAL HIGH (ref 15–65)

## 2022-01-02 LAB — RENAL FUNCTION PANEL
Albumin: 2.2 g/dL — ABNORMAL LOW (ref 3.5–5.0)
Anion gap: 13 (ref 5–15)
BUN: 61 mg/dL — ABNORMAL HIGH (ref 6–20)
CO2: 11 mmol/L — ABNORMAL LOW (ref 22–32)
Calcium: 5.4 mg/dL — CL (ref 8.9–10.3)
Chloride: 119 mmol/L — ABNORMAL HIGH (ref 98–111)
Creatinine, Ser: 9.23 mg/dL — ABNORMAL HIGH (ref 0.44–1.00)
GFR, Estimated: 5 mL/min — ABNORMAL LOW (ref >60)
Glucose, Bld: 79 mg/dL (ref 70–99)
Phosphorus: 6.9 mg/dL — ABNORMAL HIGH (ref 2.5–4.6)
Potassium: 3.1 mmol/L — ABNORMAL LOW (ref 3.5–5.1)
Sodium: 143 mmol/L (ref 135–145)

## 2022-01-02 LAB — ABO/RH: ABO/RH(D): O POS

## 2022-01-02 LAB — UREA NITROGEN, URINE: Urea Nitrogen, Ur: 229 mg/dL

## 2022-01-02 LAB — PREPARE RBC (CROSSMATCH)

## 2022-01-02 MED ORDER — SODIUM BICARBONATE 650 MG PO TABS
1300.0000 mg | ORAL_TABLET | Freq: Three times a day (TID) | ORAL | Status: AC
  Administered 2022-01-02: 1300 mg via ORAL
  Filled 2022-01-02 (×2): qty 2

## 2022-01-02 MED ORDER — SODIUM CHLORIDE 0.9 % IV SOLN
4.0000 g | INTRAVENOUS | Status: AC
  Filled 2022-01-02: qty 40

## 2022-01-02 MED ORDER — POTASSIUM CHLORIDE CRYS ER 20 MEQ PO TBCR
40.0000 meq | EXTENDED_RELEASE_TABLET | Freq: Once | ORAL | Status: DC
  Filled 2022-01-02: qty 2

## 2022-01-02 MED ORDER — SODIUM CHLORIDE 0.9 % IV SOLN
6.0000 mg/kg | INTRAVENOUS | Status: DC
  Filled 2022-01-02: qty 13

## 2022-01-02 MED ORDER — CALCITRIOL 0.5 MCG PO CAPS
1.0000 ug | ORAL_CAPSULE | Freq: Every day | ORAL | Status: DC
  Administered 2022-01-02 – 2022-01-06 (×4): 1 ug via ORAL
  Filled 2022-01-02 (×4): qty 2

## 2022-01-02 MED ORDER — OYSTER SHELL CALCIUM/D3 500-5 MG-MCG PO TABS
1.0000 | ORAL_TABLET | Freq: Every day | ORAL | Status: DC
  Filled 2022-01-02: qty 1

## 2022-01-02 MED ORDER — JUVEN PO PACK
1.0000 | PACK | Freq: Two times a day (BID) | ORAL | Status: DC
  Administered 2022-01-04 – 2022-01-08 (×4): 1 via ORAL
  Filled 2022-01-02 (×6): qty 1

## 2022-01-02 MED ORDER — CALCIUM CARBONATE 1250 (500 CA) MG PO TABS
1250.0000 mg | ORAL_TABLET | Freq: Every day | ORAL | Status: AC
  Administered 2022-01-03: 1250 mg via ORAL
  Filled 2022-01-02 (×2): qty 1

## 2022-01-02 MED ORDER — SODIUM CHLORIDE 0.9% IV SOLUTION: Freq: Once | INTRAVENOUS | Status: DC

## 2022-01-02 MED ORDER — METRONIDAZOLE 500 MG/100ML IV SOLN
500.0000 mg | Freq: Two times a day (BID) | INTRAVENOUS | Status: DC
  Administered 2022-01-02 – 2022-01-03 (×2): 500 mg via INTRAVENOUS
  Filled 2022-01-02 (×3): qty 100

## 2022-01-02 MED ORDER — SEVELAMER CARBONATE 800 MG PO TABS
800.0000 mg | ORAL_TABLET | Freq: Three times a day (TID) | ORAL | Status: DC
  Administered 2022-01-02 – 2022-01-10 (×17): 800 mg via ORAL
  Filled 2022-01-02 (×17): qty 1

## 2022-01-02 NOTE — Progress Notes (Signed)
VASCULAR SURGERY:  Her ABIs and her upper extremity vein maps are pending.  If she needs to start dialysis now, I would recommend placement of a temporary catheter for now. I would be reluctant to place a tunneled dialysis catheter given the extensive infection of the right foot.  If her ABI suggest that she has significant peripheral arterial disease I can potentially do her arteriogram on Friday.  Given that she is starting dialysis we can use contrast and get better visualization.    Natalie Gallop, MD 8:24 AM

## 2022-01-02 NOTE — Progress Notes (Signed)
Dis most of the patients admission, halfway through the patient stated "this is ridiculous" She refused to answer any questions after.  Informed patients nurse Seth Bake.

## 2022-01-02 NOTE — ED Notes (Addendum)
This RN asked Network engineer to page the hospitalist at this time so this RN can report critical results from the lab

## 2022-01-02 NOTE — Progress Notes (Signed)
[facility] Progress Note   56 y.o. female DM, obesity, HTN, CKD unknown stage, presenting with a wound in her right foot. She actually was trying to shave a callous a few weeks ago and ended up cutting herself seen in the ED recently and sent home with Clindamycin. Wound cultures grew out Staph warnerii, CNS, and Strep anginosis; unfortunately the wound did not improve and she came back to the ED with foul smelling drainage. Xrays showed chronic Charcot foot and the patient was started on Vancomycin and Cefepime. Creatinine was noted to be 12.5 initially and has been elevated for over 2 mths; Creatinine was 9.26 10/22/2021 and prior to that the last labs were on 03/11/2018 w/ a creatinine of 1.31.     Assessment/ Plan:   Renal failure - suspicious for chronic progression with a creatinine that was already 9.26 on 10/22/2021. She stated that her PMD is Dr. [name] [phone]) but they do not have any labs outside of what is available on Epic. It appears she was lost to f/u for many years and has had progression of her kidney disease. Really no need to repeat the renal ultrasound as she just had one on 12/27/2021 which showed low volume kidneys, hyperechogenic. Fortunately she doesn't have any absolute indications for dialysis. She was resistant to having a TC to initiate dialysis and kept stating that she wants home dialysis. I think someone (likely primary) has told her about nearing dialysis as she has been looking on Youtube about ESRD and dialysis access. Non active sediment in urine. UPC 4.5 (elevated with ESRD) - Hold Lisinopril/HCTZ, Lasix as well. -  HCO3 only 10 and she needs to initiate dialysis; waiting for catheter Started HCO3 to temporize. - Had to return to continue dialysis education; she is now willing to have an access placed now. Will need a permanent access eventually but certainly will need a TC to start RRT. Appreciate Dr. Scot Dock seeing her 11/7.   - Obtaining  vein mapping for her; she is left hand dominant.  HTN  - Norvasc 5mg  daily for now; UF with RRT will help as well Renal osteodystrophy w/ vit d deficiency - Phos 6.9 (start renvela), 25VitD 11.7 (start calcitriol) and waiting on iPTH; Foot wound - per primary Anemia - will need transfusion today; TSAT 32% F only 87 but with infection can't give IV iron. CRP 1.9 DM  Subjective:   Foot pain but denies n/v/f/c/sob. Mainly complaining of all the things that have to be done, multiple blood draws, procedures, etc.    Objective:   BP (!) 157/84 (BP Location: Right Arm)   Pulse 77   Temp 98.4 F (36.9 C)   Resp 14   Ht 5\' 6"  (1.676 m)   Wt 108.9 kg   LMP 05/26/2017   SpO2 100%   BMI 38.74 kg/m   Intake/Output Summary (Last 24 hours) at 01/02/2022 0850 Last data filed at 01/01/2022 1245 Gross per 24 hour  Intake 303.75 ml  Output --  Net 303.75 ml   Weight change:   Physical Exam: General appearance: NCAT Eyes: conj pallor Neck: no adenopathy, no JVD Back: No CVA tenderness. Resp: clear to auscultation bilaterally Cardio: regular rate and rhythm GI: SNDNT + BS Extremities: edema woody; wound in bottom of right foot  Imaging: MR HEEL RIGHT WO CONTRAST  Result Date: 01/01/2022 CLINICAL DATA:  Charcot foot, plantar wound EXAM: MR OF THE RIGHT HEEL WITHOUT CONTRAST TECHNIQUE: Multiplanar, multisequence MR imaging of the ankle  was performed. No intravenous contrast was administered. COMPARISON:  03/11/2018 FINDINGS: TENDONS Peroneal: Peroneus longus tendinopathy, spur like abutment of the cuboid along the peroneus longus. Posteromedial: Flexor hallucis longus tenosynovitis. Accessory navicular noted. Anterior: Unremarkable Achilles: Unremarkable Plantar Fascia: Mild thickening of the medial band of the plantar fascia on image 13 series 8, cannot exclude mild plantar fasciitis. LIGAMENTS Lateral: Discontinuous ATFL compatible with ATFL tear/insufficiency. Thickened calcaneofibular  ligament. Medial: Mildly irregular/disorganized deep tibiotalar component of the deltoid ligament. Thickened tibiospring and tibionavicular components. Thickened superomedial portion of the spring ligament. CARTILAGE Ankle Joint: 5 mm osteochondral lesion of the posterior talar dome on image 9 series 3 with associated surrounding marrow edema, similar to previous. Subtalar Joints/Sinus Tarsi: Markedly severe erosive and degenerative arthropathy in the anterior talar dome, talar neck, and talar head, with increased involvement and fragmentation compared to the 03/11/2018 exam. Degenerative arthropathy anteriorly along the subtalar joints. Bones: Severe Charcot arthropathy particularly at the Chopart joint also involving the midfoot, with worsened edema and heterogeneous lesions in the talar head and neck with some associated volume loss; substantial erosions and fragments along the adjacent navicular which is compressed and narrowed laterally; and irregularity in arthropathy proximally along the cuneiforms and cuboid. Findings are progressive from 03/11/2018 although not dramatically so. The Lisfranc ligament still appears to be intact on image 23 series 9, with no malalignment at the Lisfranc joint. Both the talar head and L2 anterior calcaneus are inferiorly displaced and angulated with respect to the midfoot. Other: Plantar cutaneous defect below the lower margin of the calcaneus as on image 24 series 8. No underlying abscess or definite draining tract is seen in this vicinity although there is some confluence of low T1 signal as on image 10 series 4 which may reflect local cellulitis. There is also circumferential edema along the distal calf and tracking into the dorsum of the foot in the subcutaneous tissues, cellulitis is not excluded. IMPRESSION: 1. 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 2. Plantar cutaneous defect/wound  below the lower anterior margin of the calcaneus, with some confluence of low T1 signal which may reflect local cellulitis. No underlying abscess or definite draining tract is identified. Accordingly, the severe arthropathy is thought to most likely represent a continuation of Charcot arthropathy rather than osteomyelitis and septic arthritis. 3. Discontinuous ATFL compatible with ATFL tear/insufficiency. Thickened calcaneofibular ligament, deltoid ligament, and superomedial portion of the spring ligament. 4. Flexor hallucis longus tenosynovitis. 5. Peroneus longus tendinopathy. 6. Mild thickening of the medial band of the plantar fascia, cannot exclude mild plantar fasciitis. 7. 5 mm osteochondral lesion of the posterior talar dome, similar to previous. 8. Circumferential subcutaneous edema along the distal calf and tracking into the dorsum of the foot, cellulitis is not excluded. Electronically Signed   By: Van Clines M.D.   On: 01/01/2022 14:31   DG Foot Complete Right  Result Date: 12/31/2021 CLINICAL DATA:  Fall smelling wound. EXAM: RIGHT FOOT COMPLETE - 3+ VIEW COMPARISON:  Radiograph 12/15/2021 FINDINGS: Plantar wound at the level of the midfoot. No deep soft tissue gas extension. No radiopaque foreign body. This appears slightly larger than on prior exam. There is no evidence of subjacent osteomyelitis, no erosions or periostitis. Midfoot changes consistent with Charcot foot. No fracture. IMPRESSION: 1. Plantar wound at the level of the midfoot. No deep soft tissue gas extension. No radiopaque foreign body. 2. No radiographic findings of osteomyelitis. 3. Findings consistent with chronic Charcot  foot. Electronically Signed   By: Keith Rake M.D.   On: 12/31/2021 21:24    Labs: BMET Recent Labs  Lab 12/27/21 2015 01/01/22 0542 01/01/22 1834 01/02/22 0100  NA 140 139  143  --  143  K 2.8* 3.2*  3.2*  --  3.1*  CL 113* 113*  118*  --  119*  CO2 13* 10*  --  11*  GLUCOSE 101*  96  93  --  79  BUN 59* 68*  66*  --  61*  CREATININE 10.38* 10.93*  12.50*  --  9.23*  CALCIUM 5.5* 5.8*  --  5.4*  PHOS  --   --  7.8* 6.9*   CBC Recent Labs  Lab 12/27/21 2015 01/01/22 0542 01/02/22 0100  WBC 5.8 6.9 4.9  NEUTROABS 3.6 4.5  --   HGB 8.2* 9.3*  10.5* 6.8*  HCT 26.1* 29.1*  31.0* 21.8*  MCV 90.0 90.7 92.0  PLT 278 277 222    Medications:     sodium chloride   Intravenous Once   calcium carbonate  1,250 mg Oral Q breakfast   gentamicin ointment   Topical BID   heparin  5,000 Units Subcutaneous Q8H   potassium chloride  40 mEq Oral Once   saccharomyces boulardii  250 mg Oral BID   sodium bicarbonate  1,300 mg Oral TID AC & HS      Otelia Santee, MD 01/02/2022, 8:50 AM

## 2022-01-02 NOTE — Progress Notes (Signed)
Orthopedic Tech Progress Note Patient Details:  [name] Jul 11, 1965 [id number] Large CAM Walker with an extra large CAM sock was applied to her foot with no discomfort.  Ortho Devices Type of Ortho Device: CAM walker Ortho Device/Splint Location: Right foot Ortho Device/Splint Interventions: Application   Post Interventions Patient Tolerated: Well  Linus Salmons Song Myre 01/02/2022, 5:52 PM

## 2022-01-02 NOTE — Plan of Care (Addendum)
Nutrition Education Note  RD consulted for Diet Education for wound healing of diabetic foot wound infection. Patient is noted to have PMH of CKD now progressed to ESRD with new need for dialysis. Per chart review, pt needs to start long term HD but notes today indicate plan to hold off on tunneled dialysis catheter due to extensive foot infection. However, patient was seen by PCCM today to discuss temporary dialysis catheter and pt refused.   Met with patient at bedside who reports she is now agreeable to dialysis but only in the near future. Provided patient with "Nutrition for People on Dialysis" and discussed that patient has increased calorie and protein needs for both wound healing as well as once starting dialysis. Discussed importance of protein intake at each meal and snack for healing and once on dialysis.  Explained why diet modifications are needed and provided lists of foods to limit/avoid that are high potassium, sodium, and phosphorus. Patient is noted to have high phosphorus so encouraged moderation of phosphorus containing foods. Strongly encouraged compliance of this diet.    Discussed need for fluid restriction with dialysis, importance of minimizing weight gain between HD treatments, and renal-friendly beverage options. Patient used to drink a lot of soda but now drinks mostly water and gatorade. Discussed that gatorade can contain potassium and sodium.  Encouraged pt to discuss specific diet questions/concerns with RD at HD outpatient facility. Teach back method used.  Expect fair compliance.  Patient seen by Podiatry after visit and declining debridement and possible skin graft application and wanting to hold off on surgical procedures.   Body mass index is 38.74 kg/m. Pt meets criteria for Obesity II based on current BMI.  Current diet order is renal/carb modified. Patient NPO since this morning and upset about not having diet but diet ordered shortly after visit. Labs and  medications reviewed. Will order Juven BID to support wound healing. No further nutrition interventions warranted at this time. RD contact information provided. If additional nutrition issues arise, please re-consult RD.  Samson Frederic RD, LDN For contact information, refer to [facility].

## 2022-01-02 NOTE — ED Notes (Signed)
DR HALL NOTIFIED OF CRITICAL VALUES AT THIS TIME

## 2022-01-02 NOTE — Progress Notes (Signed)
PHARMACY - PHYSICIAN COMMUNICATION CRITICAL VALUE ALERT - BLOOD CULTURE IDENTIFICATION (BCID)  [name] is an 56 y.o. female who presented to [facility] on 12/31/2021 with a chief complaint of a wound infection  Assessment:  Patient has one anaerobic blood culture bottle with gram-negative rods.  BCID did not identify the organism.    Name of physician (or Provider) Contacted: Dr. Avon Gully notified  Current antibiotics: daptomycin, cefepime, metronidazole  Changes to prescribed antibiotics recommended: none - she is on broad coverage which is appropriate as we await identification of the organism and surgical plan of care Patient is on recommended antibiotics - No changes needed  Results for orders placed or performed during the hospital encounter of 12/31/21  Blood Culture ID Panel (Reflexed) (Collected: 01/01/2022  6:58 AM)  Result Value Ref Range   Enterococcus faecalis NOT DETECTED NOT DETECTED   Enterococcus Faecium NOT DETECTED NOT DETECTED   Listeria monocytogenes NOT DETECTED NOT DETECTED   Staphylococcus species NOT DETECTED NOT DETECTED   Staphylococcus aureus (BCID) NOT DETECTED NOT DETECTED   Staphylococcus epidermidis NOT DETECTED NOT DETECTED   Staphylococcus lugdunensis NOT DETECTED NOT DETECTED   Streptococcus species NOT DETECTED NOT DETECTED   Streptococcus agalactiae NOT DETECTED NOT DETECTED   Streptococcus pneumoniae NOT DETECTED NOT DETECTED   Streptococcus pyogenes NOT DETECTED NOT DETECTED   A.calcoaceticus-baumannii NOT DETECTED NOT DETECTED   Bacteroides fragilis NOT DETECTED NOT DETECTED   Enterobacterales NOT DETECTED NOT DETECTED   Enterobacter cloacae complex NOT DETECTED NOT DETECTED   Escherichia coli NOT DETECTED NOT DETECTED   Klebsiella aerogenes NOT DETECTED NOT DETECTED   Klebsiella oxytoca NOT DETECTED NOT DETECTED   Klebsiella pneumoniae NOT DETECTED NOT DETECTED   Proteus species NOT DETECTED NOT DETECTED   Salmonella species NOT  DETECTED NOT DETECTED   Serratia marcescens NOT DETECTED NOT DETECTED   Haemophilus influenzae NOT DETECTED NOT DETECTED   Neisseria meningitidis NOT DETECTED NOT DETECTED   Pseudomonas aeruginosa NOT DETECTED NOT DETECTED   Stenotrophomonas maltophilia NOT DETECTED NOT DETECTED   Candida albicans NOT DETECTED NOT DETECTED   Candida auris NOT DETECTED NOT DETECTED   Candida glabrata NOT DETECTED NOT DETECTED   Candida krusei NOT DETECTED NOT DETECTED   Candida parapsilosis NOT DETECTED NOT DETECTED   Candida tropicalis NOT DETECTED NOT DETECTED   Cryptococcus neoformans/gattii NOT DETECTED NOT DETECTED    Candie Mile 01/02/2022  11:38 AM

## 2022-01-02 NOTE — Progress Notes (Signed)
Pharmacy Antibiotic Note  [name] is a 56 y.o. female admitted on 12/31/2021 with Cellulitis.  Pharmacy has been consulted for Daptomycin and Cefepime dosing.  Patient with baseline CKD4 - current CrCl <10 mL/min. No UOP documented. Likely will need dialysis in the near future.  Baseline CK ordered + weekly while on dapto  Plan: Daptomycin 650mg  Q48H (6 mg/kg) Cefepime 1g Q24H Flagyl 500mg  Q12H F/u cultures for therapy de-escalation F/u nephrology plans for dose adjustments  Height: 5\' 6"  (167.6 cm) Weight: 108.9 kg (240 lb) IBW/kg (Calculated) : 59.3  Temp (24hrs), Avg:98.2 F (36.8 C), Min:97.7 F (36.5 C), Max:98.6 F (37 C)  Recent Labs  Lab 12/27/21 2015 01/01/22 0542 01/01/22 0543 01/02/22 0100  WBC 5.8 6.9  --  4.9  CREATININE 10.38* 10.93*  12.50*  --  9.23*  LATICACIDVEN  --   --  1.1  --      Estimated Creatinine Clearance: 8.7 mL/min (A) (by C-G formula based on SCr of 9.23 mg/dL (H)).    Allergies  Allergen Reactions   Penicillins Rash    Antimicrobials this admission: Vanc 11/7  Cefepime 11/7 >> Dapto 11/8 >> Flagyl 11/8 >>  Microbiology results: 11/7 Bcx: sent 10/21 Wound: Staph warneri, Strep anginosis, CoN staph  Thank you for allowing pharmacy to be a part of this patient's care.  Merrilee Jansky, PharmD Clinical Pharmacist 01/02/2022 7:55 AM

## 2022-01-02 NOTE — Consult Note (Addendum)
  Subjective:  Patient ID: [name], female    DOB: Nov 12, 1965,  MRN: [id number]  A 56 y.o. female medical history significant of diabetes mellitus type 2, CKD stage V, and obesity who presents for worsening wound of her right foot wound.  She states she is doing okay minimal pain to the wound site.  She has underlying Charcot deformity as well.  She is worried that she may lose her leg.  She has had this wound for quite some time.  She has been doing local wound care herself.  Denies any other acute complaints.  She never followed up in our office.  Objective:   Vitals:   01/02/22 0939 01/02/22 1402  BP: (!) 161/88 (!) 159/83  Pulse: 77 80  Resp: 18   Temp: 98.3 F (36.8 C) 99.6 F (37.6 C)  SpO2: 96% 100%   General AA&O x3. Normal mood and affect.  Vascular Dorsalis pedis and posterior tibial pulses 2/4 bilat. Brisk capillary refill to all digits. Pedal hair present.  Neurologic Epicritic sensation grossly intact.  Dermatologic Right calcaneocuboid wound with does not probe down to bone.  No purulent drainage noted.  Some malodor present.  Some hyperkeratotic lesion noted.  No other abnormalities identified  Orthopedic: MMT 5/5 in dorsiflexion, plantarflexion, inversion, and eversion. Normal joint ROM without pain or crepitus.    IMPRESSION: 1. 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 2. Plantar cutaneous defect/wound below the lower anterior margin of the calcaneus, with some confluence of low T1 signal which may reflect local cellulitis. No underlying abscess or definite draining tract is identified. Accordingly, the severe arthropathy is thought to most likely represent a continuation of Charcot arthropathy rather than osteomyelitis and septic arthritis. 3. Discontinuous ATFL compatible with ATFL tear/insufficiency. Thickened calcaneofibular ligament, deltoid ligament, and superomedial  portion of the spring ligament. 4. Flexor hallucis longus tenosynovitis. 5. Peroneus longus tendinopathy. 6. Mild thickening of the medial band of the plantar fascia, cannot exclude mild plantar fasciitis. 7. 5 mm osteochondral lesion of the posterior talar dome, similar to previous. 8. Circumferential subcutaneous edema along the distal calf and tracking into the dorsum of the foot, cellulitis is not excluded.  Assessment & Plan:  Patient was evaluated and treated and all questions answered.  Right plantar lateral foot ulcer with underlying severe Charcot deformity/diabetes -All questions and concerns were discussed with the patient in extensive detail. -MRI was reviewed with the patient in extensive detail at this time I am not able to appreciate any bone infection.  Patient will just need IV antibiotics and local wound care.  Patient will follow-up at the wound care center when discharged from the hospital. -I also discussed with the patient that she may benefit from some debridement of the wound with a possible skin graft application.  However patient would like to hold off on surgical procedures at this time.  I am also concerned that she will not be able to maintain right nonweightbearing status that it requires as well. -Local wound care with Betadine wet-to-dry dressing -Vascular is planning on doing angiogram then patient is okay to be discharged from podiatric standpoint. -Nonweightbearing to the right lower extremity in cam boot -Podiatry to sign off please reconsult [place] as needed. -Patient is a high risk of undergoing right below the knee amputation if the wound regresses.  She states understanding.  Felipa Furnace, DPM  Accessible via secure chat for questions or concerns.

## 2022-01-02 NOTE — Progress Notes (Signed)
BUE vein mapping and ABI have been completed.   Results can be found under chart review under CV PROC. 01/02/2022 1:24 PM Shanita Kanan RVT, RDMS

## 2022-01-02 NOTE — ED Notes (Signed)
Patient refused blood test due at 6 am .

## 2022-01-02 NOTE — ED Notes (Signed)
Patient provided education on the updated plan of care and refuses plan of care and medications at this time. Oncoming RN Mortimer Fries RN and Dr. Nevada Crane notified at this time. Patient verbalizes that she is refusing right now but is willing to try again later.

## 2022-01-02 NOTE — Progress Notes (Signed)
PCCM asked to see patient for temporary dialysis catheter placement.  I saw patient and explained the procedure and indications for it, however pt is definitively refusing.  States that dialysis access will be the last thing she allows after her other medical issues have improved.  Again, explained the need for temporary dialysis access, but pt states she will only talk to surgery about this, not any other services.   Please contact PCCM if we can be of further assistance.   Otilio Carpen Evangelia Whitaker, [name] [facility] See Amion for pager If no response to pager , please call [phone] until 7pm After 7:00 pm call Elink  [phone]

## 2022-01-02 NOTE — ED Notes (Signed)
Pt now states I can give her the vitamin D and maxipime

## 2022-01-02 NOTE — ED Notes (Signed)
THIS RN SPOKE WITH DR Nevada Crane VIA PHONE AT THIS TIME

## 2022-01-02 NOTE — ED Notes (Signed)
Pt is still refusing medications/antibiotics but will except blood.

## 2022-01-02 NOTE — Progress Notes (Addendum)
1 unit PRBCs ordered to be transfused for hemoglobin of 6.8.  Additionally 4 g IV calcium gluconate ordered to be administered for corrected calcium for albumin of 6.8.  Calcium carbonate 1250 mg daily also started x 3 days.  Addendum: Additionally, added p.o. sodium bicarb 1300 mg 4 times daily x4 doses for serum bicarb of 11, added 1 dose of p.o. potassium 40 mEq for potassium of 3.1 to avoid worsening hypokalemia while on sodium bicarb.  We will repeat BMP in 4 hours.  Time 15 minutes

## 2022-01-02 NOTE — Progress Notes (Signed)
PROGRESS NOTE    [name]  EML:[id number] DOB: 23-Feb-1966 DOA: 12/31/2021 PCP: [name], MD   Brief Narrative:  [name] is a 56 y.o. female with medical history significant of diabetes mellitus type 2, CKD stage V, and obesity who presents for worsening pain and a wound with foul smell of her right foot.    She has been seen in in the emergency department on 10/21 for the same and given imaging/findings at that time was appropriately sent home with clindamycin which she reports she completed.  Wound cultures at the time noted Staphylococcus warneri, Streptococcus anginosis, and Staphylococcus species. X-rays of the right foot at intake noted to the plantar aspect of the foot with signs of bone involvement for which findings were consistent with chronic Charcot foot.  Assessment & Plan:   Principal Problem:   Diabetic foot infection ([place]) Active Problems:   Hypocalcemia   Hypokalemia   Chronic kidney disease (CKD), stage V (HCC)   Metabolic acidosis, increased anion gap   Diarrhea   HTN (hypertension)   Diabetes mellitus with hyperglycemia, without long-term current use of insulin (HCC)   Obesity (BMI 30-39.9)   Ulcer of right foot, with necrosis of muscle (HCC)   Charcot's arthropathy   Diabetic foot wound infection, failure of outpatient antibiotics Patient does not meet sepsis criteria -Previously on clindamycin, reports completion also medication compliance has otherwise been an issue -Podiatry (Dr. Sherryle Lis) following, appreciate insight and recommendations -MRI confirms severe Charcot arthropathy -with notable cellulitis and no definitive abscess or draining tract.  Osteomyelitis is less likely. -In the setting of noncompliance patient has been refusing labs as such vancomycin levels cannot be collected and given her CKD stage V we will transition to daptomycin in hopes to reduce renal risk. -Continue cefepime, daptomycin, Flagyl per podiatry  recommendations -ESR 46, CRP 1.9 -Cultures pending (previous cultures as above showed multiple strep and staph species) -Vascular study pending to evaluate perfusion pending need for surgery   Acute on chronic anemia likely of chronic disease -Likely secondary to acute illness and infection as above -Exacerbated by CKD stage V -No current signs or symptoms of bleeding  Hypocalcemia -Status post IV calcium gluconate, continue p.o. calcium -Continue to monitor and replace as needed   Hypokalemia -Continue to follow, labs pending given patient's earlier refusal   CKD stage V -Patient continues to refuse discussion for dialysis, temporary dialysis catheter was planned to be placed today but again patient refused stating she would not agree to dialysis at this point -Comycin transitioned to daptomycin as above given on able to collect vancomycin levels due to patient noncompliance with labs -Limit contrast/nephrotoxic medications as possible -Baseline creatinine around 9, GFR less than 10 -Nephrology consulted, will follow-up for any further recommendations although likely limited given refusal of HD/access placement.   Metabolic acidosis with elevated anion gap - Likely secondary to uremia. - Appreciate nephrology insight and recommendations.   Diarrhea -Improving, likely secondary to previous prolonged antibiotic course -Follow I's and O's, hold off on testing at this time given no fever no white count   Noncompliance  -Patient refusing labs overnight, declines dialysis catheter placement or dialysis -Discussed patient's outcomes will continue to be poor if she continues to be noncompliant with medical care, antibiotics and procedures.  Essential hypertension -Blood pressures initially elevated up to 159/92. -Held furosemide and lisinopril-hydrochlorothiazide due to kidney function -Hydralazine IV as needed   Controlled diabetes mellitus type 2, without long-term use of  insulin -Hemoglobin A1c was  6 on 12/21/2021 -Hypoglycemic protocol -Hold glipizide and metformin -Renal and carb modified diet -Consider placing on sensitive SSI if blood sugars noted to increase.   Obesity BMI 38.74 kg/m  DVT prophylaxis: Heparin Code Status: Full Family Communication: None present  Status is: Inpatient  Dispo: The patient is from: Home              Anticipated d/Natalie is to: Home              Anticipated d/Natalie date is: Pending above              Patient currently not medically stable for discharge  Consultants:  Podiatry, nephrology, vascular surgery  Procedures:  Pending above  Antimicrobials:  Cefepime, daptomycin, Flagyl  Subjective: No acute issues or events overnight pain currently well controlled denies nausea vomiting diarrhea constipation headache fevers chills or chest pain  Objective: Vitals:   01/02/22 0230 01/02/22 0300 01/02/22 0400 01/02/22 0500  BP:  (!) 158/88 (!) 156/81 (!) 157/84  Pulse: 80 78 79 77  Resp: _0 Temp:      TempSrc:      SpO2: 100% 100% 100% 100%  Weight:      Height:        Intake/Output Summary (Last 24 hours) at 01/02/2022 0739 Last data filed at 01/01/2022 1245 Gross per 24 hour  Intake 503.75 ml  Output --  Net 503.75 ml   Filed Weights   01/01/22 0542  Weight: 108.9 kg    Examination:  General:  Pleasantly resting in bed, No acute distress. HEENT:  Normocephalic atraumatic.  Sclerae nonicteric, noninjected.  Extraocular movements intact bilaterally. Neck:  Without mass or deformity.  Trachea is midline. Lungs:  Clear to auscultate bilaterally without rhonchi, wheeze, or rales. Heart:  Regular rate and rhythm.  Without murmurs, rubs, or gallops. Abdomen:  Soft, nontender, nondistended.  Without guarding or rebound. Extremities: Right foot wound, POA Skin:  Warm and dry, no erythema, R foot lesion/wound as above  Data Reviewed: I have personally reviewed following labs and imaging  studies  CBC: Recent Labs  Lab 12/27/21 2015 01/01/22 0542 01/02/22 0100  WBC 5.8 6.9 4.9  NEUTROABS 3.6 4.5  --   HGB 8.2* 9.3*  10.5* 6.8*  HCT 26.1* 29.1*  31.0* 21.8*  MCV 90.0 90.7 92.0  PLT 278 277 263   Basic Metabolic Panel: Recent Labs  Lab 12/27/21 2015 01/01/22 0542 01/01/22 1834 01/02/22 0100  NA 140 139  143  --  143  K 2.8* 3.2*  3.2*  --  3.1*  CL 113* 113*  118*  --  119*  CO2 13* 10*  --  11*  GLUCOSE 101* 96  93  --  79  BUN 59* 68*  66*  --  61*  CREATININE 10.38* 10.93*  12.50*  --  9.23*  CALCIUM 5.5* 5.8*  --  5.4*  PHOS  --   --  7.8* 6.9*   GFR: Estimated Creatinine Clearance: 8.7 mL/min (A) (by Natalie-G formula based on SCr of 9.23 mg/dL (H)). Liver Function Tests: Recent Labs  Lab 12/27/21 2015 01/01/22 0542 01/02/22 0100  AST 16 18  --   ALT 9 10  --   ALKPHOS 73 74  --   BILITOT 0.5 0.5  --   PROT 7.2 8.0  --   ALBUMIN 3.0* 3.4* 2.2*   No results for input(s): "LIPASE", "AMYLASE" in the last 168 hours. No results for input(s): "AMMONIA"  in the last 168 hours. Coagulation Profile: No results for input(s): "INR", "PROTIME" in the last 168 hours. Cardiac Enzymes: No results for input(s): "CKTOTAL", "CKMB", "CKMBINDEX", "TROPONINI" in the last 168 hours. BNP (last 3 results) No results for input(s): "PROBNP" in the last 8760 hours. HbA1C: No results for input(s): "HGBA1C" in the last 72 hours. CBG: No results for input(s): "GLUCAP" in the last 168 hours. Lipid Profile: No results for input(s): "CHOL", "HDL", "LDLCALC", "TRIG", "CHOLHDL", "LDLDIRECT" in the last 72 hours. Thyroid Function Tests: No results for input(s): "TSH", "T4TOTAL", "FREET4", "T3FREE", "THYROIDAB" in the last 72 hours. Anemia Panel: Recent Labs    01/01/22 1834  FERRITIN 87  TIBC 220*  IRON 70   Sepsis Labs: Recent Labs  Lab 01/01/22 0543  LATICACIDVEN 1.1   No results found for this or any previous visit (from the past 240 hour(s)).    Radiology Studies: MR HEEL RIGHT WO CONTRAST  Result Date: 01/01/2022 CLINICAL DATA:  Charcot foot, plantar wound EXAM: MR OF THE RIGHT HEEL WITHOUT CONTRAST TECHNIQUE: Multiplanar, multisequence MR imaging of the ankle was performed. No intravenous contrast was administered. COMPARISON:  03/11/2018 FINDINGS: TENDONS Peroneal: Peroneus longus tendinopathy, spur like abutment of the cuboid along the peroneus longus. Posteromedial: Flexor hallucis longus tenosynovitis. Accessory navicular noted. Anterior: Unremarkable Achilles: Unremarkable Plantar Fascia: Mild thickening of the medial band of the plantar fascia on image 13 series 8, cannot exclude mild plantar fasciitis. LIGAMENTS Lateral: Discontinuous ATFL compatible with ATFL tear/insufficiency. Thickened calcaneofibular ligament. Medial: Mildly irregular/disorganized deep tibiotalar component of the deltoid ligament. Thickened tibiospring and tibionavicular components. Thickened superomedial portion of the spring ligament. CARTILAGE Ankle Joint: 5 mm osteochondral lesion of the posterior talar dome on image 9 series 3 with associated surrounding marrow edema, similar to previous. Subtalar Joints/Sinus Tarsi: Markedly severe erosive and degenerative arthropathy in the anterior talar dome, talar neck, and talar head, with increased involvement and fragmentation compared to the 03/11/2018 exam. Degenerative arthropathy anteriorly along the subtalar joints. Bones: Severe Charcot arthropathy particularly at the Chopart joint also involving the midfoot, with worsened edema and heterogeneous lesions in the talar head and neck with some associated volume loss; substantial erosions and fragments along the adjacent navicular which is compressed and narrowed laterally; and irregularity in arthropathy proximally along the cuneiforms and cuboid. Findings are progressive from 03/11/2018 although not dramatically so. The Lisfranc ligament still appears to be intact on  image 23 series 9, with no malalignment at the Lisfranc joint. Both the talar head and L2 anterior calcaneus are inferiorly displaced and angulated with respect to the midfoot. Other: Plantar cutaneous defect below the lower margin of the calcaneus as on image 24 series 8. No underlying abscess or definite draining tract is seen in this vicinity although there is some confluence of low T1 signal as on image 10 series 4 which may reflect local cellulitis. There is also circumferential edema along the distal calf and tracking into the dorsum of the foot in the subcutaneous tissues, cellulitis is not excluded. IMPRESSION: 1. 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 2. Plantar cutaneous defect/wound below the lower anterior margin of the calcaneus, with some confluence of low T1 signal which may reflect local cellulitis. No underlying abscess or definite draining tract is identified. Accordingly, the severe arthropathy is thought to most likely represent a continuation of Charcot arthropathy rather than osteomyelitis and septic arthritis. 3. Discontinuous ATFL compatible with ATFL tear/insufficiency. Thickened calcaneofibular ligament,  deltoid ligament, and superomedial portion of the spring ligament. 4. Flexor hallucis longus tenosynovitis. 5. Peroneus longus tendinopathy. 6. Mild thickening of the medial band of the plantar fascia, cannot exclude mild plantar fasciitis. 7. 5 mm osteochondral lesion of the posterior talar dome, similar to previous. 8. Circumferential subcutaneous edema along the distal calf and tracking into the dorsum of the foot, cellulitis is not excluded. Electronically Signed   By: Van Clines M.D.   On: 01/01/2022 14:31   DG Foot Complete Right  Result Date: 12/31/2021 CLINICAL DATA:  Fall smelling wound. EXAM: RIGHT FOOT COMPLETE - 3+ VIEW COMPARISON:  Radiograph 12/15/2021 FINDINGS: Plantar wound at  the level of the midfoot. No deep soft tissue gas extension. No radiopaque foreign body. This appears slightly larger than on prior exam. There is no evidence of subjacent osteomyelitis, no erosions or periostitis. Midfoot changes consistent with Charcot foot. No fracture. IMPRESSION: 1. Plantar wound at the level of the midfoot. No deep soft tissue gas extension. No radiopaque foreign body. 2. No radiographic findings of osteomyelitis. 3. Findings consistent with chronic Charcot foot. Electronically Signed   By: Keith Rake M.D.   On: 12/31/2021 21:24    Scheduled Meds:  sodium chloride   Intravenous Once   calcium carbonate  1,250 mg Oral Q breakfast   gentamicin ointment   Topical BID   heparin  5,000 Units Subcutaneous Q8H   potassium chloride  40 mEq Oral Once   saccharomyces boulardii  250 mg Oral BID   sodium bicarbonate  1,300 mg Oral TID AC & HS   vancomycin variable dose per unstable renal function (pharmacist dosing)   Does not apply See admin instructions   Continuous Infusions:  calcium gluconate     ceFEPime (MAXIPIME) IV       LOS: 1 day   Time spent: 35mn  [name] Natalie Zeya Balles, DO [facility]  If 7PM-7AM, please contact night-coverage [email or web address]  01/02/2022, 7:39 AM

## 2022-01-02 NOTE — ED Notes (Signed)
Pt refusing medications except for tylenol.

## 2022-01-02 NOTE — ED Notes (Signed)
Pt report was given and transport was called to take the pt upstairs. Will take the pt if not gone after giving other pts medications.

## 2022-01-03 LAB — BPAM RBC
Blood Product Expiration Date: 202311302359
ISSUE DATE / TIME: 202311081922
Unit Type and Rh: 5100

## 2022-01-03 LAB — CBC
HCT: 28.7 % — ABNORMAL LOW (ref 36.0–46.0)
Hemoglobin: 9.6 g/dL — ABNORMAL LOW (ref 12.0–15.0)
MCH: 29.4 pg (ref 26.0–34.0)
MCHC: 33.4 g/dL (ref 30.0–36.0)
MCV: 87.8 fL (ref 80.0–100.0)
Platelets: 230 10*3/uL (ref 150–400)
RBC: 3.27 MIL/uL — ABNORMAL LOW (ref 3.87–5.11)
RDW: 14.6 % (ref 11.5–15.5)
WBC: 5.8 10*3/uL (ref 4.0–10.5)
nRBC: 0 % (ref 0.0–0.2)

## 2022-01-03 LAB — TYPE AND SCREEN
ABO/RH(D): O POS
Antibody Screen: NEGATIVE
Unit division: 0

## 2022-01-03 LAB — CK: Total CK: 172 U/L (ref 38–234)

## 2022-01-03 LAB — HEPATITIS B SURFACE ANTIGEN: Hepatitis B Surface Ag: NONREACTIVE

## 2022-01-03 MED ORDER — SODIUM CHLORIDE 0.9 % IV SOLN
6.0000 mg/kg | INTRAVENOUS | Status: DC
  Administered 2022-01-03: 650 mg via INTRAVENOUS
  Filled 2022-01-03: qty 13

## 2022-01-03 MED ORDER — METRONIDAZOLE 500 MG PO TABS
500.0000 mg | ORAL_TABLET | Freq: Two times a day (BID) | ORAL | Status: DC
  Administered 2022-01-03: 500 mg via ORAL
  Filled 2022-01-03: qty 1

## 2022-01-03 MED ORDER — CHLORHEXIDINE GLUCONATE CLOTH 2 % EX PADS
6.0000 | MEDICATED_PAD | Freq: Every day | CUTANEOUS | Status: DC
  Administered 2022-01-04 – 2022-01-08 (×4): 6 via TOPICAL

## 2022-01-03 MED ORDER — SODIUM BICARBONATE 650 MG PO TABS
1300.0000 mg | ORAL_TABLET | Freq: Two times a day (BID) | ORAL | Status: DC
  Administered 2022-01-03 – 2022-01-06 (×7): 1300 mg via ORAL
  Filled 2022-01-03 (×7): qty 2

## 2022-01-03 NOTE — Progress Notes (Signed)
PROGRESS NOTE    Daylani Deblois  OZD:[id number] DOB: 1965/06/18 DOA: 12/31/2021 PCP: [name], MD   Brief Narrative:  Geeta Dworkin is a 56 y.o. female with medical history significant of diabetes mellitus type 2, CKD stage V, and obesity who presents for worsening pain and a wound with foul smell of her right foot.    She has been seen in in the emergency department on 10/21 for the same and given imaging/findings at that time was appropriately sent home with clindamycin which she reports she completed.  Wound cultures at the time noted Staphylococcus warneri, Streptococcus anginosis, and Staphylococcus species. X-rays of the right foot at intake noted to the plantar aspect of the foot with signs of bone involvement for which findings were consistent with chronic Charcot foot.  Assessment & Plan:   Principal Problem:   Diabetic foot infection ([place]) Active Problems:   Hypocalcemia   Hypokalemia   Chronic kidney disease (CKD), stage V (HCC)   Metabolic acidosis, increased anion gap   Diarrhea   HTN (hypertension)   Diabetes mellitus with hyperglycemia, without long-term current use of insulin (HCC)   Obesity (BMI 30-39.9)   Ulcer of right foot, with necrosis of muscle ([place])   Charcot's arthropathy   Diabetic foot wound infection, failure of outpatient antibiotics Patient does not meet sepsis criteria -Previously on clindamycin, reports completion but other medication compliance has otherwise been an issue -Podiatry following -no indication for surgery based on imaging, patient is okay to be discharged from podiatric standpoint -continue antibiotics -MRI confirms severe Charcot arthropathy -with notable cellulitis and no definitive abscess or draining tract.  Osteomyelitis is less likely. -Continue cefepime, daptomycin, Flagyl per podiatry recommendations -Will de-escalate based on cultures -ESR 46, CRP 1.9 -Cultures pending (previous cultures as above showed multiple  strep and staph species) -Vascular study pending to evaluate perfusion pending need for surgery   Acute on chronic anemia likely of chronic disease -Likely secondary to acute illness and infection as above -Exacerbated by CKD stage V -No current signs or symptoms of bleeding  Hypocalcemia -Status post IV calcium gluconate, continue p.o. calcium -Continue to monitor and replace as needed   Hypokalemia -Continue to follow, labs pending given patient's earlier refusal   CKD stage V -Patient initially refusing HD/access but now agreeable 11/9 -Nephro following -appreciate insight recommendations -Vancomycin discontinued as above(now on Dapto) -Baseline creatinine around 9, GFR less than 10, poor follow-up in the outpatient setting  Metabolic acidosis with elevated anion gap -Secondary to uremia. -Likely to improve with dialysis   Diarrhea -Improving, likely secondary to previous prolonged antibiotic course -Follow I's and O's, hold off on testing at this time given no fever no white count   Noncompliance  -Patient refusing labs overnight, declines dialysis catheter placement or dialysis -Discussed patient's outcomes will continue to be poor if she continues to be noncompliant with medical care, antibiotics and procedures.  Essential hypertension -Blood pressures likely to improve with dialysis -Held furosemide and lisinopril-hydrochlorothiazide due to kidney function -Hydralazine IV as needed   Controlled diabetes mellitus type 2, without long-term use of insulin -Hemoglobin A1c was 6 on 12/21/2021 -Hypoglycemic protocol -Hold glipizide and metformin -Renal and carb modified diet -Consider placing on sensitive SSI if blood sugars noted to increase.   Obesity BMI 38.74 kg/m  DVT prophylaxis: Heparin Code Status: Full Family Communication: None present  Status is: Inpatient  Dispo: The patient is from: Home  Anticipated d/c is to: Home               Anticipated d/c date is: Pending above              Patient currently not medically stable for discharge  Consultants:  Podiatry, nephrology, vascular surgery  Procedures:  Pending above  Antimicrobials:  Cefepime, daptomycin, Flagyl  Subjective: No acute issues or events overnight pain currently well controlled denies nausea vomiting diarrhea constipation headache fevers chills or chest pain  Objective: Vitals:   01/02/22 2000 01/02/22 2304 01/02/22 2305 01/03/22 0455  BP: (!) 156/86 (!) 162/94 (!) 162/94 (!) 155/81  Pulse: 83 84 84 76  Resp: 16 18    Temp: 98.3 F (36.8 C) 99.2 F (37.3 C) 99.2 F (37.3 C) 98.7 F (37.1 C)  TempSrc: Oral Oral Oral Oral  SpO2: 100%  100% 95%  Weight:      Height:        Intake/Output Summary (Last 24 hours) at 01/03/2022 0733 Last data filed at 01/02/2022 2305 Gross per 24 hour  Intake 676.56 ml  Output --  Net 676.56 ml    Filed Weights   01/01/22 0542  Weight: 108.9 kg    Examination:  General:  Pleasantly resting in bed, No acute distress. HEENT:  Normocephalic atraumatic.  Sclerae nonicteric, noninjected.  Extraocular movements intact bilaterally. Neck:  Without mass or deformity.  Trachea is midline. Lungs:  Clear to auscultate bilaterally without rhonchi, wheeze, or rales. Heart:  Regular rate and rhythm.  Without murmurs, rubs, or gallops. Abdomen:  Soft, nontender, nondistended.  Without guarding or rebound. Extremities: Right foot wound, POA Skin:  Warm and dry, no erythema, R foot lesion/wound as above  Data Reviewed: I have personally reviewed following labs and imaging studies  CBC: Recent Labs  Lab 12/27/21 2015 01/01/22 0542 01/02/22 0100  WBC 5.8 6.9 4.9  NEUTROABS 3.6 4.5  --   HGB 8.2* 9.3*  10.5* 6.8*  HCT 26.1* 29.1*  31.0* 21.8*  MCV 90.0 90.7 92.0  PLT 278 277 680    Basic Metabolic Panel: Recent Labs  Lab 12/27/21 2015 01/01/22 0542 01/01/22 1834 01/02/22 0100  NA 140 139  143  --   143  K 2.8* 3.2*  3.2*  --  3.1*  CL 113* 113*  118*  --  119*  CO2 13* 10*  --  11*  GLUCOSE 101* 96  93  --  79  BUN 59* 68*  66*  --  61*  CREATININE 10.38* 10.93*  12.50*  --  9.23*  CALCIUM 5.5* 5.8*  --  5.4*  PHOS  --   --  7.8* 6.9*    GFR: Estimated Creatinine Clearance: 8.7 mL/min (A) (by C-G formula based on SCr of 9.23 mg/dL (H)). Liver Function Tests: Recent Labs  Lab 12/27/21 2015 01/01/22 0542 01/02/22 0100  AST 16 18  --   ALT 9 10  --   ALKPHOS 73 74  --   BILITOT 0.5 0.5  --   PROT 7.2 8.0  --   ALBUMIN 3.0* 3.4* 2.2*    No results for input(s): "LIPASE", "AMYLASE" in the last 168 hours. No results for input(s): "AMMONIA" in the last 168 hours. Coagulation Profile: No results for input(s): "INR", "PROTIME" in the last 168 hours. Cardiac Enzymes: No results for input(s): "CKTOTAL", "CKMB", "CKMBINDEX", "TROPONINI" in the last 168 hours. BNP (last 3 results) No results for input(s): "PROBNP" in the last 8760 hours. HbA1C:  No results for input(s): "HGBA1C" in the last 72 hours. CBG: No results for input(s): "GLUCAP" in the last 168 hours. Lipid Profile: No results for input(s): "CHOL", "HDL", "LDLCALC", "TRIG", "CHOLHDL", "LDLDIRECT" in the last 72 hours. Thyroid Function Tests: No results for input(s): "TSH", "T4TOTAL", "FREET4", "T3FREE", "THYROIDAB" in the last 72 hours. Anemia Panel: Recent Labs    01/01/22 1834  FERRITIN 87  TIBC 220*  IRON 70    Sepsis Labs: Recent Labs  Lab 01/01/22 0543  LATICACIDVEN 1.1    Recent Results (from the past 240 hour(s))  Blood culture (routine x 2)     Status: None (Preliminary result)   Collection Time: 01/01/22  5:44 AM   Specimen: BLOOD  Result Value Ref Range Status   Specimen Description BLOOD BLOOD LEFT WRIST  Final   Special Requests   Final    BOTTLES DRAWN AEROBIC AND ANAEROBIC Blood Culture results may not be optimal due to an inadequate volume of blood received in culture bottles    Culture   Final    NO GROWTH 1 DAY Performed at [facility], [place] [address] [place], [place] [address]    Report Status PENDING  Incomplete  Blood culture (routine x 2)     Status: None (Preliminary result)   Collection Time: 01/01/22  6:58 AM   Specimen: BLOOD RIGHT FOREARM  Result Value Ref Range Status   Specimen Description BLOOD RIGHT FOREARM  Final   Special Requests   Final    BOTTLES DRAWN AEROBIC AND ANAEROBIC Blood Culture results may not be optimal due to an inadequate volume of blood received in culture bottles   Culture  Setup Time   Final    GRAM NEGATIVE RODS ANAEROBIC BOTTLE ONLY CRITICAL RESULT CALLED TO, READ BACK BY AND VERIFIED WITH: PHARMD J.FRENS AT 1133 ON 01/02/2022 BY T.SAAD. Performed at [facility], [place] [address], [place], [place] [address]    Culture GRAM NEGATIVE RODS  Final   Report Status PENDING  Incomplete  Blood Culture ID Panel (Reflexed)     Status: None   Collection Time: 01/01/22  6:58 AM  Result Value Ref Range Status   Enterococcus faecalis NOT DETECTED NOT DETECTED Final   Enterococcus Faecium NOT DETECTED NOT DETECTED Final   Listeria monocytogenes NOT DETECTED NOT DETECTED Final   Staphylococcus species NOT DETECTED NOT DETECTED Final   Staphylococcus aureus (BCID) NOT DETECTED NOT DETECTED Final   Staphylococcus epidermidis NOT DETECTED NOT DETECTED Final   Staphylococcus lugdunensis NOT DETECTED NOT DETECTED Final   Streptococcus species NOT DETECTED NOT DETECTED Final   Streptococcus agalactiae NOT DETECTED NOT DETECTED Final   Streptococcus pneumoniae NOT DETECTED NOT DETECTED Final   Streptococcus pyogenes NOT DETECTED NOT DETECTED Final   A.calcoaceticus-baumannii NOT DETECTED NOT DETECTED Final   Bacteroides fragilis NOT DETECTED NOT DETECTED Final   Enterobacterales NOT DETECTED NOT DETECTED Final   Enterobacter cloacae complex NOT DETECTED NOT DETECTED Final   Escherichia coli NOT DETECTED NOT DETECTED Final    Klebsiella aerogenes NOT DETECTED NOT DETECTED Final   Klebsiella oxytoca NOT DETECTED NOT DETECTED Final   Klebsiella pneumoniae NOT DETECTED NOT DETECTED Final   Proteus species NOT DETECTED NOT DETECTED Final   Salmonella species NOT DETECTED NOT DETECTED Final   Serratia marcescens NOT DETECTED NOT DETECTED Final   Haemophilus influenzae NOT DETECTED NOT DETECTED Final   Neisseria meningitidis NOT DETECTED NOT DETECTED Final   Pseudomonas aeruginosa NOT DETECTED NOT DETECTED Final  Stenotrophomonas maltophilia NOT DETECTED NOT DETECTED Final   Candida albicans NOT DETECTED NOT DETECTED Final   Candida auris NOT DETECTED NOT DETECTED Final   Candida glabrata NOT DETECTED NOT DETECTED Final   Candida krusei NOT DETECTED NOT DETECTED Final   Candida parapsilosis NOT DETECTED NOT DETECTED Final   Candida tropicalis NOT DETECTED NOT DETECTED Final   Cryptococcus neoformans/gattii NOT DETECTED NOT DETECTED Final    Comment: Performed at [facility], [place] [address] [place], [place] [address]     Radiology Studies: VAS [place] UPPER EXT VEIN MAPPING (PRE-OP AVF)  Result Date: 01/02/2022 UPPER EXTREMITY VEIN MAPPING Patient Name:  VIDALIA SERPAS  Date of Exam:   01/02/2022 Medical Rec #: [id number]        Accession #:    [id number] Date of Birth: 01/20/1966        Patient Gender: F Patient Age:   76 years Exam Location:  [facility] Procedure:      VAS [place] UPPER EXT VEIN MAPPING (PRE-OP AVF) Referring Phys: Otelia Santee --------------------------------------------------------------------------------  Indications: Pre-access. Limitations: IV/Bandage to BUE Comparison Study: No previous exams Performing Technologist: Jody Hill RVT, RDMS  Examination Guidelines: A complete evaluation includes B-mode imaging, spectral Doppler, color Doppler, and power Doppler as needed of all accessible portions of each vessel. Bilateral testing is considered an integral part of a complete examination. Limited  examinations for reoccurring indications may be performed as noted. +-----------------+-------------+----------+----------------------+ Right Cephalic   Diameter (cm)Depth (cm)       Findings        +-----------------+-------------+----------+----------------------+ Shoulder             0.24        0.25                          +-----------------+-------------+----------+----------------------+ Prox upper arm       0.23        0.85                          +-----------------+-------------+----------+----------------------+ Mid upper arm        0.18        0.80                          +-----------------+-------------+----------+----------------------+ Dist upper arm       0.10        0.62                          +-----------------+-------------+----------+----------------------+ Antecubital fossa    0.16        0.29                          +-----------------+-------------+----------+----------------------+ Prox forearm         0.34        0.47   branching and thrombus +-----------------+-------------+----------+----------------------+ Mid forearm                             not visualized and IV  +-----------------+-------------+----------+----------------------+ Dist forearm                            not visualized and IV  +-----------------+-------------+----------+----------------------+ Wrist  not visualized     +-----------------+-------------+----------+----------------------+ +-----------------+-------------+----------+---------+ Right Basilic    Diameter (cm)Depth (cm)Findings  +-----------------+-------------+----------+---------+ Prox upper arm       0.50                         +-----------------+-------------+----------+---------+ Mid upper arm        0.29                         +-----------------+-------------+----------+---------+ Dist upper arm       0.36                          +-----------------+-------------+----------+---------+ Antecubital fossa    0.41               branching +-----------------+-------------+----------+---------+ Prox forearm         0.16                         +-----------------+-------------+----------+---------+ Mid forearm          0.14                         +-----------------+-------------+----------+---------+ Distal forearm       0.14                         +-----------------+-------------+----------+---------+ Elbow                0.10                         +-----------------+-------------+----------+---------+ +-----------------+-------------+----------+---------------------+ Left Cephalic    Diameter (cm)Depth (cm)      Findings        +-----------------+-------------+----------+---------------------+ Shoulder             0.32        1.30                         +-----------------+-------------+----------+---------------------+ Prox upper arm       0.28        1.26                         +-----------------+-------------+----------+---------------------+ Mid upper arm        0.21        0.75                         +-----------------+-------------+----------+---------------------+ Dist upper arm       0.16        0.76         branching       +-----------------+-------------+----------+---------------------+ Antecubital fossa    0.17        0.48                         +-----------------+-------------+----------+---------------------+ Prox forearm                            not visualized and IV +-----------------+-------------+----------+---------------------+ Mid forearm          0.18        0.77                         +-----------------+-------------+----------+---------------------+  Dist forearm         0.16        0.38                         +-----------------+-------------+----------+---------------------+ Wrist                0.11        0.43                          +-----------------+-------------+----------+---------------------+ +-----------------+-------------+----------+---------------------+ Left Basilic     Diameter (cm)Depth (cm)      Findings        +-----------------+-------------+----------+---------------------+ Prox upper arm       0.61                                     +-----------------+-------------+----------+---------------------+ Mid upper arm        0.23                                     +-----------------+-------------+----------+---------------------+ Dist upper arm       0.22                                     +-----------------+-------------+----------+---------------------+ Antecubital fossa    0.16                     branching       +-----------------+-------------+----------+---------------------+ Prox forearm                            not visualized and IV +-----------------+-------------+----------+---------------------+ Mid forearm          0.08                                     +-----------------+-------------+----------+---------------------+ Distal forearm       0.08                                     +-----------------+-------------+----------+---------------------+ Wrist                0.07                                     +-----------------+-------------+----------+---------------------+ *See table(s) above for measurements and observations.  Diagnosing physician: Jamelle Haring Electronically signed by Jamelle Haring on 01/02/2022 at 3:53:57 PM.    Final    VAS [place] ABI WITH/WO TBI  Result Date: 01/02/2022  LOWER EXTREMITY DOPPLER STUDY Patient Name:  LADELL BEY  Date of Exam:   01/02/2022 Medical Rec #: [id number]        Accession #:    [id number] Date of Birth: [birth year]        Patient Gender: F Patient Age:   63 years Exam Location:  [facility] Procedure:      VAS [place] ABI WITH/WO TBI Referring Phys: Fuller Plan  --------------------------------------------------------------------------------  Indications: DM foot infection RLE High  Risk Factors: Hypertension, Diabetes, past history of smoking. Other Factors: CKDV.  Comparison Study: No previous Performing Technologist: Hill, Jody RVT, RDMS  Examination Guidelines: A complete evaluation includes at minimum, Doppler waveform signals and systolic blood pressure reading at the level of bilateral brachial, anterior tibial, and posterior tibial arteries, when vessel segments are accessible. Bilateral testing is considered an integral part of a complete examination. Photoelectric Plethysmograph (PPG) waveforms and toe systolic pressure readings are included as required and additional duplex testing as needed. Limited examinations for reoccurring indications may be performed as noted.  ABI Findings: +---------+------------------+-----+---------+--------+ Right    Rt Pressure (mmHg)IndexWaveform Comment  +---------+------------------+-----+---------+--------+ Brachial 169                    triphasic         +---------+------------------+-----+---------+--------+ PTA                             biphasic >255 [place]  +---------+------------------+-----+---------+--------+ DP                              biphasic >255 [place]  +---------+------------------+-----+---------+--------+ Great Toe                       Abnormal >255 [place]  +---------+------------------+-----+---------+--------+ +---------+------------------+-----+---------+-------+ Left     Lt Pressure (mmHg)IndexWaveform Comment +---------+------------------+-----+---------+-------+ Brachial 172                    triphasic        +---------+------------------+-----+---------+-------+ PTA                             biphasic >255 [place] +---------+------------------+-----+---------+-------+ DP                              biphasic >255 [place]  +---------+------------------+-----+---------+-------+ Great Toe                       Abnormal >255 [place] +---------+------------------+-----+---------+-------+ +-------+-----------+-----------+------------+------------+ ABI/TBIToday's ABIToday's TBIPrevious ABIPrevious TBI +-------+-----------+-----------+------------+------------+ Right  [place]         [place]                                  +-------+-----------+-----------+------------+------------+ Left   [place]         [place]                                  +-------+-----------+-----------+------------+------------+ Arterial wall calcification precludes accurate ankle pressures and ABIs.  Summary: Right: Resting right ankle-brachial index and toe-brachial index indicates noncompressible right lower extremity arteries. Left: Resting left ankle-brachial index and toe-brachial index indicates noncompressible left lower extremity arteries. *See table(s) above for measurements and observations.  Electronically signed by Jamelle Haring on 01/02/2022 at 3:53:11 PM.    Final    MR HEEL RIGHT WO CONTRAST  Result Date: 01/01/2022 CLINICAL DATA:  Charcot foot, plantar wound EXAM: MR OF THE RIGHT HEEL WITHOUT CONTRAST TECHNIQUE: Multiplanar, multisequence MR imaging of the ankle was performed. No intravenous contrast was administered. COMPARISON:  03/11/2018 FINDINGS: TENDONS Peroneal: Peroneus longus tendinopathy, spur like abutment of the cuboid along the peroneus longus. Posteromedial: Flexor  hallucis longus tenosynovitis. Accessory navicular noted. Anterior: Unremarkable Achilles: Unremarkable Plantar Fascia: Mild thickening of the medial band of the plantar fascia on image 13 series 8, cannot exclude mild plantar fasciitis. LIGAMENTS Lateral: Discontinuous ATFL compatible with ATFL tear/insufficiency. Thickened calcaneofibular ligament. Medial: Mildly irregular/disorganized deep tibiotalar component of the deltoid ligament. Thickened tibiospring and  tibionavicular components. Thickened superomedial portion of the spring ligament. CARTILAGE Ankle Joint: 5 mm osteochondral lesion of the posterior talar dome on image 9 series 3 with associated surrounding marrow edema, similar to previous. Subtalar Joints/Sinus Tarsi: Markedly severe erosive and degenerative arthropathy in the anterior talar dome, talar neck, and talar head, with increased involvement and fragmentation compared to the 03/11/2018 exam. Degenerative arthropathy anteriorly along the subtalar joints. Bones: Severe Charcot arthropathy particularly at the Chopart joint also involving the midfoot, with worsened edema and heterogeneous lesions in the talar head and neck with some associated volume loss; substantial erosions and fragments along the adjacent navicular which is compressed and narrowed laterally; and irregularity in arthropathy proximally along the cuneiforms and cuboid. Findings are progressive from 03/11/2018 although not dramatically so. The Lisfranc ligament still appears to be intact on image 23 series 9, with no malalignment at the Lisfranc joint. Both the talar head and L2 anterior calcaneus are inferiorly displaced and angulated with respect to the midfoot. Other: Plantar cutaneous defect below the lower margin of the calcaneus as on image 24 series 8. No underlying abscess or definite draining tract is seen in this vicinity although there is some confluence of low T1 signal as on image 10 series 4 which may reflect local cellulitis. There is also circumferential edema along the distal calf and tracking into the dorsum of the foot in the subcutaneous tissues, cellulitis is not excluded. IMPRESSION: 1. 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 2. Plantar cutaneous defect/wound below the lower anterior margin of the calcaneus, with some confluence of low T1 signal which may reflect local cellulitis. No  underlying abscess or definite draining tract is identified. Accordingly, the severe arthropathy is thought to most likely represent a continuation of Charcot arthropathy rather than osteomyelitis and septic arthritis. 3. Discontinuous ATFL compatible with ATFL tear/insufficiency. Thickened calcaneofibular ligament, deltoid ligament, and superomedial portion of the spring ligament. 4. Flexor hallucis longus tenosynovitis. 5. Peroneus longus tendinopathy. 6. Mild thickening of the medial band of the plantar fascia, cannot exclude mild plantar fasciitis. 7. 5 mm osteochondral lesion of the posterior talar dome, similar to previous. 8. Circumferential subcutaneous edema along the distal calf and tracking into the dorsum of the foot, cellulitis is not excluded. Electronically Signed   By: Van Clines M.D.   On: 01/01/2022 14:31    Scheduled Meds:  sodium chloride   Intravenous Once   calcitRIOL  1 mcg Oral Daily   calcium carbonate  1,250 mg Oral Q breakfast   gentamicin ointment   Topical BID   heparin  5,000 Units Subcutaneous Q8H   nutrition supplement (JUVEN)  1 packet Oral BID BM   potassium chloride  40 mEq Oral Once   saccharomyces boulardii  250 mg Oral BID   sevelamer carbonate  800 mg Oral TID WC   Continuous Infusions:  ceFEPime (MAXIPIME) IV Stopped (01/02/22 1552)   DAPTOmycin (CUBICIN) 650 mg in sodium chloride 0.9 % IVPB     metronidazole 500 mg (01/02/22 2318)     LOS: 2 days   Time spent: 50mn  Cleatis Fandrich C Bernice Mcauliffe,  DO [facility]  If 7PM-7AM, please contact night-coverage [email or web address]  01/03/2022, 7:33 AM

## 2022-01-03 NOTE — Progress Notes (Signed)
Patient agreeable to take her medicine including IV antibiotic with this RN. Pt however refused heparin.Pt also agreed to apply gentamicin ointment to right foot wound and redress it with gauze and ace wrap. Left foot wound with foul odor and scant drainage.

## 2022-01-03 NOTE — Progress Notes (Signed)
CSW met with pt regarding SDOH screening for food insecurity.  Pt states that CSW has "the wrong person."   Pt denies any problems with obtaining food at this time.  No intervention needed. Lurline Idol, MSW, LCSW 11/9/20232:24 PM

## 2022-01-03 NOTE — Progress Notes (Signed)
VASCULAR SURGERY ASSESSMENT & PLAN:   RIGHT LEG WOUND: Podiatry plans to follow the right foot wound as an outpatient.  They have explained to her that she may require debridement of the wound.  From our standpoint, her arterial Doppler study showed biphasic Doppler signals in the posterior tibial and dorsalis pedis position.  Thus her circulation is likely adequate.  However, ideally we would like to get an arteriogram to be sure.  However as she has not begun dialysis I would be reluctant to use IV contrast as this could likely worsen her renal insufficiency.  I do not think CO2 would give adequate visualization of her tibial vessels given her obesity.  Thus we plan to hold off on arteriography.  STAGE V CHRONIC KIDNEY DISEASE: She does not have adequate veins for a fistula and would likely require placement of an AV graft.  The only potential chance for a fistula would be a basilic vein transposition on the right.  This vein is marginal.  Thus I would not recommend placement of a graft until the issue with the right leg is involved given the risk for infection.   I have had a long discussion with the patient this morning about placing a catheter and beginning dialysis.  She is now agreeable to proceed with this.  I will schedule her for placement of a tunneled dialysis catheter tomorrow.  Normally, we would only want to place a temporary catheter given the infection in the right foot.  However, given that she has had a hard time agreeing to various procedures I would recommend going ahead and placing a tunneled dialysis catheter understanding that there is an increased risk of infection.  I have scheduled this for tomorrow.  I have written preop orders.  COMBINED CHRONIC VENOUS INSUFFICIENCY AND LYMPHEDEMA: She could benefit from elevation and compression therapy.   SUBJECTIVE:   No specific complaints this morning.  After an extensive discussion she is now agreeable to proceed with placement of  a catheter tomorrow.  ROS - no CP, SOB  PHYSICAL EXAM:   Vitals:   01/02/22 2000 01/02/22 2304 01/02/22 2305 01/03/22 0455  BP: (!) 156/86 (!) 162/94 (!) 162/94 (!) 155/81  Pulse: 83 84 84 76  Resp: 16 18    Temp: 98.3 F (36.8 C) 99.2 F (37.3 C) 99.2 F (37.3 C) 98.7 F (37.1 C)  TempSrc: Oral Oral Oral Oral  SpO2: 100%  100% 95%  Weight:      Height:       No change in the wound on the right foot. Lungs are clear.   DATA:   ARTERIAL DOPPLER STUDY: I have reviewed the arterial Doppler study that was done yesterday.  On the right side, which is the side of concern, there is a biphasic dorsalis pedis and posterior tibial signal.  The arteries are calcified and cannot be compressed so an ABI could not be obtained nor could a toe pressure be obtained because of her lymphedema.  On the left side she has a biphasic dorsalis pedis and posterior tibial signal.  The arteries were calcified and therefore an ABI could not be obtained.  Again a toe pressure could not be obtained because of her lymphedema.  VEIN MAP: I have reviewed her vein map that was done yesterday.  She does not appear to have an adequate vein for a fistula.  The only potential chance would likely be a right basilic vein transposition.  The vein is marginal in size.  Lab Results  Component Value Date   WBC 4.9 01/02/2022   HGB 6.8 (LL) 01/02/2022   HCT 21.8 (L) 01/02/2022   MCV 92.0 01/02/2022   PLT 222 01/02/2022   Lab Results  Component Value Date   CREATININE 9.23 (H) 01/02/2022    PROBLEM LIST:    Principal Problem:   Diabetic foot infection ([place]) Active Problems:   Diabetes mellitus with hyperglycemia, without long-term current use of insulin (HCC)   Hypokalemia   HTN (hypertension)   Chronic kidney disease (CKD), stage V (HCC)   Hypocalcemia   Metabolic acidosis, increased anion gap   Diarrhea   Obesity (BMI 30-39.9)   Ulcer of right foot, with necrosis of muscle (HCC)   Charcot's  arthropathy   CURRENT MEDS:    sodium chloride   Intravenous Once   calcitRIOL  1 mcg Oral Daily   calcium carbonate  1,250 mg Oral Q breakfast   gentamicin ointment   Topical BID   heparin  5,000 Units Subcutaneous Q8H   nutrition supplement (JUVEN)  1 packet Oral BID BM   potassium chloride  40 mEq Oral Once   saccharomyces boulardii  250 mg Oral BID   sevelamer carbonate  800 mg Oral TID WC    [name] Office: [phone] 01/03/2022

## 2022-01-03 NOTE — H&P (View-Only) (Signed)
VASCULAR SURGERY ASSESSMENT & PLAN:   RIGHT LEG WOUND: Podiatry plans to follow the right foot wound as an outpatient.  They have explained to her that she may require debridement of the wound.  From our standpoint, her arterial Doppler study showed biphasic Doppler signals in the posterior tibial and dorsalis pedis position.  Thus her circulation is likely adequate.  However, ideally we would like to get an arteriogram to be sure.  However as she has not begun dialysis I would be reluctant to use IV contrast as this could likely worsen her renal insufficiency.  I do not think CO2 would give adequate visualization of her tibial vessels given her obesity.  Thus we plan to hold off on arteriography.  STAGE V CHRONIC KIDNEY DISEASE: She does not have adequate veins for a fistula and would likely require placement of an AV graft.  The only potential chance for a fistula would be a basilic vein transposition on the right.  This vein is marginal.  Thus I would not recommend placement of a graft until the issue with the right leg is involved given the risk for infection.   I have had a long discussion with the patient this morning about placing a catheter and beginning dialysis.  She is now agreeable to proceed with this.  I will schedule her for placement of a tunneled dialysis catheter tomorrow.  Normally, we would only want to place a temporary catheter given the infection in the right foot.  However, given that she has had a hard time agreeing to various procedures I would recommend going ahead and placing a tunneled dialysis catheter understanding that there is an increased risk of infection.  I have scheduled this for tomorrow.  I have written preop orders.  COMBINED CHRONIC VENOUS INSUFFICIENCY AND LYMPHEDEMA: She could benefit from elevation and compression therapy.   SUBJECTIVE:   No specific complaints this morning.  After an extensive discussion she is now agreeable to proceed with placement of  a catheter tomorrow.  ROS - no CP, SOB  PHYSICAL EXAM:   Vitals:   01/02/22 2000 01/02/22 2304 01/02/22 2305 01/03/22 0455  BP: (!) 156/86 (!) 162/94 (!) 162/94 (!) 155/81  Pulse: 83 84 84 76  Resp: 16 18    Temp: 98.3 F (36.8 C) 99.2 F (37.3 C) 99.2 F (37.3 C) 98.7 F (37.1 C)  TempSrc: Oral Oral Oral Oral  SpO2: 100%  100% 95%  Weight:      Height:       No change in the wound on the right foot. Lungs are clear.   DATA:   ARTERIAL DOPPLER STUDY: I have reviewed the arterial Doppler study that was done yesterday.  On the right side, which is the side of concern, there is a biphasic dorsalis pedis and posterior tibial signal.  The arteries are calcified and cannot be compressed so an ABI could not be obtained nor could a toe pressure be obtained because of her lymphedema.  On the left side she has a biphasic dorsalis pedis and posterior tibial signal.  The arteries were calcified and therefore an ABI could not be obtained.  Again a toe pressure could not be obtained because of her lymphedema.  VEIN MAP: I have reviewed her vein map that was done yesterday.  She does not appear to have an adequate vein for a fistula.  The only potential chance would likely be a right basilic vein transposition.  The vein is marginal in size.  Lab Results  Component Value Date   WBC 4.9 01/02/2022   HGB 6.8 (LL) 01/02/2022   HCT 21.8 (L) 01/02/2022   MCV 92.0 01/02/2022   PLT 222 01/02/2022   Lab Results  Component Value Date   CREATININE 9.23 (H) 01/02/2022    PROBLEM LIST:    Principal Problem:   Diabetic foot infection ([place]) Active Problems:   Diabetes mellitus with hyperglycemia, without long-term current use of insulin (HCC)   Hypokalemia   HTN (hypertension)   Chronic kidney disease (CKD), stage V (HCC)   Hypocalcemia   Metabolic acidosis, increased anion gap   Diarrhea   Obesity (BMI 30-39.9)   Ulcer of right foot, with necrosis of muscle (HCC)   Charcot's  arthropathy   CURRENT MEDS:    sodium chloride   Intravenous Once   calcitRIOL  1 mcg Oral Daily   calcium carbonate  1,250 mg Oral Q breakfast   gentamicin ointment   Topical BID   heparin  5,000 Units Subcutaneous Q8H   nutrition supplement (JUVEN)  1 packet Oral BID BM   potassium chloride  40 mEq Oral Once   saccharomyces boulardii  250 mg Oral BID   sevelamer carbonate  800 mg Oral TID WC    Deitra Mayo Office: [phone] 01/03/2022

## 2022-01-03 NOTE — Progress Notes (Signed)
[facility] Progress Note   56 y.o. female DM, obesity, HTN, CKD unknown stage, presenting with a wound in her right foot. She actually was trying to shave a callous a few weeks ago and ended up cutting herself seen in the ED recently and sent home with Clindamycin. Wound cultures grew out Staph warnerii, CNS, and Strep anginosis; unfortunately the wound did not improve and she came back to the ED with foul smelling drainage. Xrays showed chronic Charcot foot and the patient was started on Vancomycin and Cefepime. Creatinine was noted to be 12.5 initially and has been elevated for over 2 mths; Creatinine was 9.26 10/22/2021 and prior to that the last labs were on 03/11/2018 w/ a creatinine of 1.31.     Assessment/ Plan:   Renal failure - suspicious for chronic progression with a creatinine that was already 9.26 on 10/22/2021. She stated that her PMD is Dr. [name] [phone]) but they do not have any labs outside of what is available on Epic. It appears she was lost to f/u for many years and has had progression of her kidney disease. Really no need to repeat the renal ultrasound as she just had one on 12/27/2021 which showed low volume kidneys, hyperechogenic. Fortunately she doesn't have any absolute indications for dialysis. She was resistant to having a TC to initiate dialysis and kept stating that she wants home dialysis. I think someone (likely primary) has told her about nearing dialysis as she has been looking on Youtube about ESRD and dialysis access. Non active sediment in urine. UPC 4.5 (elevated with ESRD) - Hold Lisinopril/HCTZ, Lasix as well. -  HCO3 only 10 and she needs to initiate dialysis; waiting for catheter. HCO3 2 tabs BID to temporize. - Had to return to continue dialysis education; she is now willing to have an access placed now. Will need a permanent access eventually but certainly will need a TC to start RRT. Appreciate Dr. Scot Dock seeing her again this AM;  plan is TC Fri even tho there is a risk of infection with her foot wound. I also believe this is the way to go given her resistance to procedures; she almost certainly will be unhappy at an exchange later.   - Obtained vein mapping for her but per Dr Scot Dock permanent access only after her PAD + foot infection is treated adequately; she is left hand dominant.  HTN  - Norvasc 5mg  daily for now; UF with RRT will help as well Renal osteodystrophy w/ vit d deficiency - Phos 6.9 (started renvela), 25VitD 11.7 (started calcitriol 78mcg daily) and iPTH 281 (on calcitriol); Foot wound - per primary Anemia - 1 U PRBC 11/8; TSAT 32% F only 87 but with infection can't give IV iron. CRP 1.9 DM  Subjective:   Foot pain but denies n/v/f/c/sob. Mainly complaining of all the things that have to be done, multiple blood draws, procedures, etc. Also not having someone help her to the bathroom when she notifies.   Objective:   BP (!) 155/81   Pulse 76   Temp 98.1 F (36.7 C) (Oral)   Resp 18   Ht 5\' 6"  (1.676 m)   Wt 108.9 kg   LMP 05/26/2017   SpO2 98%   BMI 38.74 kg/m   Intake/Output Summary (Last 24 hours) at 01/03/2022 0846 Last data filed at 01/02/2022 2305 Gross per 24 hour  Intake 676.56 ml  Output --  Net 676.56 ml   Weight change:   Physical Exam: General  appearance: NCAT Eyes: conj pallor Neck: no adenopathy, no JVD Back: No CVA tenderness. Resp: clear to auscultation bilaterally Cardio: regular rate and rhythm GI: SNDNT + BS Extremities: edema woody; wound in bottom of right foot  Imaging: VAS [place] UPPER EXT VEIN MAPPING (PRE-OP AVF)  Result Date: 01/02/2022 UPPER EXTREMITY VEIN MAPPING Patient Name:  [name]  Date of Exam:   01/02/2022 Medical Rec #: [id number]        Accession #:    [id number] Date of Birth: 11/06/1965        Patient Gender: F Patient Age:   67 years Exam Location:  [facility] Procedure:      VAS [place] UPPER EXT VEIN MAPPING (PRE-OP AVF) Referring Phys:  Otelia Santee --------------------------------------------------------------------------------  Indications: Pre-access. Limitations: IV/Bandage to BUE Comparison Study: No previous exams Performing Technologist: Jody Hill RVT, RDMS  Examination Guidelines: A complete evaluation includes B-mode imaging, spectral Doppler, color Doppler, and power Doppler as needed of all accessible portions of each vessel. Bilateral testing is considered an integral part of a complete examination. Limited examinations for reoccurring indications may be performed as noted. +-----------------+-------------+----------+----------------------+ Right Cephalic   Diameter (cm)Depth (cm)       Findings        +-----------------+-------------+----------+----------------------+ Shoulder             0.24        0.25                          +-----------------+-------------+----------+----------------------+ Prox upper arm       0.23        0.85                          +-----------------+-------------+----------+----------------------+ Mid upper arm        0.18        0.80                          +-----------------+-------------+----------+----------------------+ Dist upper arm       0.10        0.62                          +-----------------+-------------+----------+----------------------+ Antecubital fossa    0.16        0.29                          +-----------------+-------------+----------+----------------------+ Prox forearm         0.34        0.47   branching and thrombus +-----------------+-------------+----------+----------------------+ Mid forearm                             not visualized and IV  +-----------------+-------------+----------+----------------------+ Dist forearm                            not visualized and IV  +-----------------+-------------+----------+----------------------+ Wrist                                       not visualized      +-----------------+-------------+----------+----------------------+ +-----------------+-------------+----------+---------+ Right Basilic    Diameter (cm)Depth (cm)Findings  +-----------------+-------------+----------+---------+ Prox upper arm  0.50                         +-----------------+-------------+----------+---------+ Mid upper arm        0.29                         +-----------------+-------------+----------+---------+ Dist upper arm       0.36                         +-----------------+-------------+----------+---------+ Antecubital fossa    0.41               branching +-----------------+-------------+----------+---------+ Prox forearm         0.16                         +-----------------+-------------+----------+---------+ Mid forearm          0.14                         +-----------------+-------------+----------+---------+ Distal forearm       0.14                         +-----------------+-------------+----------+---------+ Elbow                0.10                         +-----------------+-------------+----------+---------+ +-----------------+-------------+----------+---------------------+ Left Cephalic    Diameter (cm)Depth (cm)      Findings        +-----------------+-------------+----------+---------------------+ Shoulder             0.32        1.30                         +-----------------+-------------+----------+---------------------+ Prox upper arm       0.28        1.26                         +-----------------+-------------+----------+---------------------+ Mid upper arm        0.21        0.75                         +-----------------+-------------+----------+---------------------+ Dist upper arm       0.16        0.76         branching       +-----------------+-------------+----------+---------------------+ Antecubital fossa    0.17        0.48                          +-----------------+-------------+----------+---------------------+ Prox forearm                            not visualized and IV +-----------------+-------------+----------+---------------------+ Mid forearm          0.18        0.77                         +-----------------+-------------+----------+---------------------+ Dist forearm         0.16        0.38                         +-----------------+-------------+----------+---------------------+  Wrist                0.11        0.43                         +-----------------+-------------+----------+---------------------+ +-----------------+-------------+----------+---------------------+ Left Basilic     Diameter (cm)Depth (cm)      Findings        +-----------------+-------------+----------+---------------------+ Prox upper arm       0.61                                     +-----------------+-------------+----------+---------------------+ Mid upper arm        0.23                                     +-----------------+-------------+----------+---------------------+ Dist upper arm       0.22                                     +-----------------+-------------+----------+---------------------+ Antecubital fossa    0.16                     branching       +-----------------+-------------+----------+---------------------+ Prox forearm                            not visualized and IV +-----------------+-------------+----------+---------------------+ Mid forearm          0.08                                     +-----------------+-------------+----------+---------------------+ Distal forearm       0.08                                     +-----------------+-------------+----------+---------------------+ Wrist                0.07                                     +-----------------+-------------+----------+---------------------+ *See table(s) above for measurements and observations.  Diagnosing  physician: Jamelle Haring Electronically signed by Jamelle Haring on 01/02/2022 at 3:53:57 PM.    Final    VAS [place] ABI WITH/WO TBI  Result Date: 01/02/2022  LOWER EXTREMITY DOPPLER STUDY Patient Name:  DON GIARRUSSO  Date of Exam:   01/02/2022 Medical Rec #: [id number]        Accession #:    [id number] Date of Birth: 1965-10-31        Patient Gender: F Patient Age:   36 years Exam Location:  [facility] Procedure:      VAS [place] ABI WITH/WO TBI Referring Phys: Fuller Plan --------------------------------------------------------------------------------  Indications: DM foot infection RLE High Risk Factors: Hypertension, Diabetes, past history of smoking. Other Factors: CKDV.  Comparison Study: No previous Performing Technologist: Hill, Jody RVT, RDMS  Examination Guidelines: A complete evaluation includes at minimum, Doppler waveform signals and systolic blood pressure reading at the level of bilateral brachial,  anterior tibial, and posterior tibial arteries, when vessel segments are accessible. Bilateral testing is considered an integral part of a complete examination. Photoelectric Plethysmograph (PPG) waveforms and toe systolic pressure readings are included as required and additional duplex testing as needed. Limited examinations for reoccurring indications may be performed as noted.  ABI Findings: +---------+------------------+-----+---------+--------+ Right    Rt Pressure (mmHg)IndexWaveform Comment  +---------+------------------+-----+---------+--------+ Brachial 169                    triphasic         +---------+------------------+-----+---------+--------+ PTA                             biphasic >255 [place]  +---------+------------------+-----+---------+--------+ DP                              biphasic >255 [place]  +---------+------------------+-----+---------+--------+ Great Toe                       Abnormal >255 [place]  +---------+------------------+-----+---------+--------+  +---------+------------------+-----+---------+-------+ Left     Lt Pressure (mmHg)IndexWaveform Comment +---------+------------------+-----+---------+-------+ Brachial 172                    triphasic        +---------+------------------+-----+---------+-------+ PTA                             biphasic >255 [place] +---------+------------------+-----+---------+-------+ DP                              biphasic >255 [place] +---------+------------------+-----+---------+-------+ Great Toe                       Abnormal >255 [place] +---------+------------------+-----+---------+-------+ +-------+-----------+-----------+------------+------------+ ABI/TBIToday's ABIToday's TBIPrevious ABIPrevious TBI +-------+-----------+-----------+------------+------------+ Right  [place]         [place]                                  +-------+-----------+-----------+------------+------------+ Left   [place]         [place]                                  +-------+-----------+-----------+------------+------------+ Arterial wall calcification precludes accurate ankle pressures and ABIs.  Summary: Right: Resting right ankle-brachial index and toe-brachial index indicates noncompressible right lower extremity arteries. Left: Resting left ankle-brachial index and toe-brachial index indicates noncompressible left lower extremity arteries. *See table(s) above for measurements and observations.  Electronically signed by Jamelle Haring on 01/02/2022 at 3:53:11 PM.    Final    MR HEEL RIGHT WO CONTRAST  Result Date: 01/01/2022 CLINICAL DATA:  Charcot foot, plantar wound EXAM: MR OF THE RIGHT HEEL WITHOUT CONTRAST TECHNIQUE: Multiplanar, multisequence MR imaging of the ankle was performed. No intravenous contrast was administered. COMPARISON:  03/11/2018 FINDINGS: TENDONS Peroneal: Peroneus longus tendinopathy, spur like abutment of the cuboid along the peroneus longus. Posteromedial: Flexor hallucis longus tenosynovitis.  Accessory navicular noted. Anterior: Unremarkable Achilles: Unremarkable Plantar Fascia: Mild thickening of the medial band of the plantar fascia on image 13 series 8, cannot exclude mild plantar fasciitis. LIGAMENTS Lateral: Discontinuous ATFL compatible with ATFL tear/insufficiency. Thickened calcaneofibular ligament. Medial:  Mildly irregular/disorganized deep tibiotalar component of the deltoid ligament. Thickened tibiospring and tibionavicular components. Thickened superomedial portion of the spring ligament. CARTILAGE Ankle Joint: 5 mm osteochondral lesion of the posterior talar dome on image 9 series 3 with associated surrounding marrow edema, similar to previous. Subtalar Joints/Sinus Tarsi: Markedly severe erosive and degenerative arthropathy in the anterior talar dome, talar neck, and talar head, with increased involvement and fragmentation compared to the 03/11/2018 exam. Degenerative arthropathy anteriorly along the subtalar joints. Bones: Severe Charcot arthropathy particularly at the Chopart joint also involving the midfoot, with worsened edema and heterogeneous lesions in the talar head and neck with some associated volume loss; substantial erosions and fragments along the adjacent navicular which is compressed and narrowed laterally; and irregularity in arthropathy proximally along the cuneiforms and cuboid. Findings are progressive from 03/11/2018 although not dramatically so. The Lisfranc ligament still appears to be intact on image 23 series 9, with no malalignment at the Lisfranc joint. Both the talar head and L2 anterior calcaneus are inferiorly displaced and angulated with respect to the midfoot. Other: Plantar cutaneous defect below the lower margin of the calcaneus as on image 24 series 8. No underlying abscess or definite draining tract is seen in this vicinity although there is some confluence of low T1 signal as on image 10 series 4 which may reflect local cellulitis. There is also  circumferential edema along the distal calf and tracking into the dorsum of the foot in the subcutaneous tissues, cellulitis is not excluded. IMPRESSION: 1. 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 2. Plantar cutaneous defect/wound below the lower anterior margin of the calcaneus, with some confluence of low T1 signal which may reflect local cellulitis. No underlying abscess or definite draining tract is identified. Accordingly, the severe arthropathy is thought to most likely represent a continuation of Charcot arthropathy rather than osteomyelitis and septic arthritis. 3. Discontinuous ATFL compatible with ATFL tear/insufficiency. Thickened calcaneofibular ligament, deltoid ligament, and superomedial portion of the spring ligament. 4. Flexor hallucis longus tenosynovitis. 5. Peroneus longus tendinopathy. 6. Mild thickening of the medial band of the plantar fascia, cannot exclude mild plantar fasciitis. 7. 5 mm osteochondral lesion of the posterior talar dome, similar to previous. 8. Circumferential subcutaneous edema along the distal calf and tracking into the dorsum of the foot, cellulitis is not excluded. Electronically Signed   By: Van Clines M.D.   On: 01/01/2022 14:31    Labs: BMET Recent Labs  Lab 12/27/21 2015 01/01/22 0542 01/01/22 1834 01/02/22 0100  NA 140 139  143  --  143  K 2.8* 3.2*  3.2*  --  3.1*  CL 113* 113*  118*  --  119*  CO2 13* 10*  --  11*  GLUCOSE 101* 96  93  --  79  BUN 59* 68*  66*  --  61*  CREATININE 10.38* 10.93*  12.50*  --  9.23*  CALCIUM 5.5* 5.8*  --  5.4*  PHOS  --   --  7.8* 6.9*   CBC Recent Labs  Lab 12/27/21 2015 01/01/22 0542 01/02/22 0100  WBC 5.8 6.9 4.9  NEUTROABS 3.6 4.5  --   HGB 8.2* 9.3*  10.5* 6.8*  HCT 26.1* 29.1*  31.0* 21.8*  MCV 90.0 90.7 92.0  PLT 278 277 222    Medications:     sodium chloride   Intravenous Once   calcitRIOL   1 mcg Oral Daily   calcium  carbonate  1,250 mg Oral Q breakfast   gentamicin ointment   Topical BID   heparin  5,000 Units Subcutaneous Q8H   nutrition supplement (JUVEN)  1 packet Oral BID BM   potassium chloride  40 mEq Oral Once   saccharomyces boulardii  250 mg Oral BID   sevelamer carbonate  800 mg Oral TID WC      Otelia Santee, MD 01/03/2022, 8:46 AM

## 2022-01-03 NOTE — Progress Notes (Addendum)
Patient noncompliant and refusing her medicines. Patient requested another nurse and stated if she needs to take her medicine, someone else will have to give it to her. Charge nurse informed.

## 2022-01-04 ENCOUNTER — Other Ambulatory Visit: Payer: Self-pay

## 2022-01-04 ENCOUNTER — Inpatient Hospital Stay (HOSPITAL_COMMUNITY): Payer: Self-pay

## 2022-01-04 ENCOUNTER — Encounter (HOSPITAL_COMMUNITY)
Admission: EM | Disposition: A | Discharge status: Home / Self Care | Payer: Self-pay | Source: Home / Self Care | Attending: Internal Medicine

## 2022-01-04 ENCOUNTER — Inpatient Hospital Stay (HOSPITAL_COMMUNITY): Payer: Self-pay | Admitting: Anesthesiology

## 2022-01-04 DIAGNOSIS — Z87891 Personal history of nicotine dependence: Secondary | ICD-10-CM

## 2022-01-04 DIAGNOSIS — E1122 Type 2 diabetes mellitus with diabetic chronic kidney disease: Secondary | ICD-10-CM

## 2022-01-04 DIAGNOSIS — N186 End stage renal disease: Secondary | ICD-10-CM

## 2022-01-04 DIAGNOSIS — N185 Chronic kidney disease, stage 5: Secondary | ICD-10-CM

## 2022-01-04 DIAGNOSIS — D631 Anemia in chronic kidney disease: Secondary | ICD-10-CM

## 2022-01-04 HISTORY — PX: INSERTION OF DIALYSIS CATHETER: SHX1324

## 2022-01-04 LAB — RENAL FUNCTION PANEL
Albumin: 2.7 g/dL — ABNORMAL LOW (ref 3.5–5.0)
Anion gap: 13 (ref 5–15)
BUN: 62 mg/dL — ABNORMAL HIGH (ref 6–20)
CO2: 13 mmol/L — ABNORMAL LOW (ref 22–32)
Calcium: 6.3 mg/dL — CL (ref 8.9–10.3)
Chloride: 111 mmol/L (ref 98–111)
Creatinine, Ser: 10.39 mg/dL — ABNORMAL HIGH (ref 0.44–1.00)
GFR, Estimated: 4 mL/min — ABNORMAL LOW (ref >60)
Glucose, Bld: 85 mg/dL (ref 70–99)
Phosphorus: 7.3 mg/dL — ABNORMAL HIGH (ref 2.5–4.6)
Potassium: 2.7 mmol/L — CL (ref 3.5–5.1)
Sodium: 137 mmol/L (ref 135–145)

## 2022-01-04 LAB — POCT I-STAT, CHEM 8
BUN: 58 mg/dL — ABNORMAL HIGH (ref 6–20)
Calcium, Ion: 0.92 mmol/L — ABNORMAL LOW (ref 1.15–1.40)
Chloride: 113 mmol/L — ABNORMAL HIGH (ref 98–111)
Creatinine, Ser: 11.6 mg/dL — ABNORMAL HIGH (ref 0.44–1.00)
Glucose, Bld: 71 mg/dL (ref 70–99)
HCT: 31 % — ABNORMAL LOW (ref 36.0–46.0)
Hemoglobin: 10.5 g/dL — ABNORMAL LOW (ref 12.0–15.0)
Potassium: 2.8 mmol/L — ABNORMAL LOW (ref 3.5–5.1)
Sodium: 144 mmol/L (ref 135–145)
TCO2: 14 mmol/L — ABNORMAL LOW (ref 22–32)

## 2022-01-04 LAB — HEPATITIS B SURFACE ANTIBODY, QUANTITATIVE: Hep B S AB Quant (Post): >1000 m[IU]/mL (ref >9.9)

## 2022-01-04 LAB — GLUCOSE, CAPILLARY
Glucose-Capillary: 104 mg/dL — ABNORMAL HIGH (ref 70–99)
Glucose-Capillary: 76 mg/dL (ref 70–99)

## 2022-01-04 LAB — SURGICAL PCR SCREEN
MRSA, PCR: NEGATIVE
Staphylococcus aureus: NEGATIVE

## 2022-01-04 SURGERY — INSERTION OF DIALYSIS CATHETER
Anesthesia: General | Site: Neck | Laterality: Right

## 2022-01-04 SURGERY — ABDOMINAL AORTOGRAM W/LOWER EXTREMITY
Anesthesia: LOCAL

## 2022-01-04 MED ORDER — DEXAMETHASONE SODIUM PHOSPHATE 10 MG/ML IJ SOLN
INTRAMUSCULAR | Status: DC | PRN
  Administered 2022-01-04: 5 mg via INTRAVENOUS

## 2022-01-04 MED ORDER — AMISULPRIDE (ANTIEMETIC) 5 MG/2ML IV SOLN: 10.0000 mg | Freq: Once | INTRAVENOUS | Status: DC | PRN

## 2022-01-04 MED ORDER — OXYCODONE-ACETAMINOPHEN 5-325 MG PO TABS: 1.0000 | ORAL_TABLET | ORAL | Status: DC | PRN

## 2022-01-04 MED ORDER — OXYCODONE HCL 5 MG/5ML PO SOLN: 5.0000 mg | Freq: Once | ORAL | Status: DC | PRN

## 2022-01-04 MED ORDER — HEPARIN 6000 UNIT IRRIGATION SOLUTION
Status: AC
  Filled 2022-01-04: qty 500

## 2022-01-04 MED ORDER — FENTANYL CITRATE (PF) 250 MCG/5ML IJ SOLN
INTRAMUSCULAR | Status: AC
  Filled 2022-01-04: qty 5

## 2022-01-04 MED ORDER — POTASSIUM CHLORIDE CRYS ER 20 MEQ PO TBCR: 20.0000 meq | EXTENDED_RELEASE_TABLET | Freq: Two times a day (BID) | ORAL | Status: DC

## 2022-01-04 MED ORDER — ONDANSETRON HCL 4 MG/2ML IJ SOLN
INTRAMUSCULAR | Status: DC | PRN
  Administered 2022-01-04: 4 mg via INTRAVENOUS

## 2022-01-04 MED ORDER — MIDAZOLAM HCL 2 MG/2ML IJ SOLN
INTRAMUSCULAR | Status: AC
  Filled 2022-01-04: qty 2

## 2022-01-04 MED ORDER — FENTANYL CITRATE (PF) 250 MCG/5ML IJ SOLN
INTRAMUSCULAR | Status: DC | PRN
  Administered 2022-01-04 (×4): 25 ug via INTRAVENOUS

## 2022-01-04 MED ORDER — POTASSIUM CHLORIDE 10 MEQ/100ML IV SOLN
10.0000 meq | INTRAVENOUS | Status: AC
  Administered 2022-01-04 (×4): 10 meq via INTRAVENOUS
  Filled 2022-01-04 (×4): qty 100

## 2022-01-04 MED ORDER — HEPARIN SODIUM (PORCINE) 1000 UNIT/ML IJ SOLN
INTRAMUSCULAR | Status: AC
  Filled 2022-01-04: qty 10

## 2022-01-04 MED ORDER — PROPOFOL 10 MG/ML IV BOLUS
INTRAVENOUS | Status: AC
  Filled 2022-01-04: qty 20

## 2022-01-04 MED ORDER — MIDAZOLAM HCL 2 MG/2ML IJ SOLN
INTRAMUSCULAR | Status: DC | PRN
  Administered 2022-01-04: 2 mg via INTRAVENOUS

## 2022-01-04 MED ORDER — DEXTROSE 50 % IV SOLN
INTRAVENOUS | Status: AC
  Administered 2022-01-04: 25 mL
  Filled 2022-01-04: qty 50

## 2022-01-04 MED ORDER — OXYCODONE HCL 5 MG PO TABS: 5.0000 mg | ORAL_TABLET | Freq: Once | ORAL | Status: DC | PRN

## 2022-01-04 MED ORDER — LIDOCAINE 2% (20 MG/ML) 5 ML SYRINGE
INTRAMUSCULAR | Status: DC | PRN
  Administered 2022-01-04: 90 mg via INTRAVENOUS
  Administered 2022-01-04: 10 mg via INTRAVENOUS

## 2022-01-04 MED ORDER — HEPARIN 6000 UNIT IRRIGATION SOLUTION
Status: DC | PRN
  Administered 2022-01-04: 1

## 2022-01-04 MED ORDER — PROPOFOL 10 MG/ML IV BOLUS
INTRAVENOUS | Status: DC | PRN
  Administered 2022-01-04: 90 mg via INTRAVENOUS

## 2022-01-04 MED ORDER — STERILE WATER FOR IRRIGATION IR SOLN
Status: DC | PRN
  Administered 2022-01-04: 1000 mL

## 2022-01-04 MED ORDER — FENTANYL CITRATE (PF) 100 MCG/2ML IJ SOLN: 25.0000 ug | INTRAMUSCULAR | Status: DC | PRN

## 2022-01-04 MED ORDER — ONDANSETRON HCL 4 MG/2ML IJ SOLN: 4.0000 mg | Freq: Once | INTRAMUSCULAR | Status: DC | PRN

## 2022-01-04 MED ORDER — HEPARIN SODIUM (PORCINE) 1000 UNIT/ML IJ SOLN
INTRAMUSCULAR | Status: DC | PRN
  Administered 2022-01-04: 3200 [IU]

## 2022-01-04 MED ORDER — LIDOCAINE-EPINEPHRINE (PF) 1 %-1:200000 IJ SOLN
INTRAMUSCULAR | Status: AC
  Filled 2022-01-04: qty 30

## 2022-01-04 MED ORDER — SODIUM CHLORIDE 0.9 % IV SOLN: INTRAVENOUS | Status: DC | PRN

## 2022-01-04 MED ORDER — PHENYLEPHRINE 80 MCG/ML (10ML) SYRINGE FOR IV PUSH (FOR BLOOD PRESSURE SUPPORT)
PREFILLED_SYRINGE | INTRAVENOUS | Status: DC | PRN
  Administered 2022-01-04: 80 ug via INTRAVENOUS
  Administered 2022-01-04: 160 ug via INTRAVENOUS
  Administered 2022-01-04: 80 ug via INTRAVENOUS
  Administered 2022-01-04: 160 ug via INTRAVENOUS
  Administered 2022-01-04: 80 ug via INTRAVENOUS

## 2022-01-04 SURGICAL SUPPLY — 41 items
BAG COUNTER SPONGE SURGICOUNT (BAG) ×1 IMPLANT
BAG DECANTER FOR FLEXI CONT (MISCELLANEOUS) ×1 IMPLANT
BIOPATCH RED 1 DISK 7.0 (GAUZE/BANDAGES/DRESSINGS) ×1 IMPLANT
CATH PALINDROME-P 19CM W/VT (CATHETERS) IMPLANT
CATH PALINDROME-P 23CM W/VT (CATHETERS) IMPLANT
CATH PALINDROME-P 28CM W/VT (CATHETERS) IMPLANT
CHLORAPREP W/TINT 26 (MISCELLANEOUS) ×1 IMPLANT
COVER PROBE W GEL 5X96 (DRAPES) IMPLANT
COVER SURGICAL LIGHT HANDLE (MISCELLANEOUS) ×1 IMPLANT
DERMABOND ADVANCED .7 DNX12 (GAUZE/BANDAGES/DRESSINGS) IMPLANT
DRAPE C-ARM 42X72 X-RAY (DRAPES) ×1 IMPLANT
DRAPE CHEST BREAST 15X10 FENES (DRAPES) ×1 IMPLANT
DRSG COVADERM 4X6 (GAUZE/BANDAGES/DRESSINGS) IMPLANT
GAUZE 4X4 16PLY ~~LOC~~+RFID DBL (SPONGE) ×1 IMPLANT
GLOVE BIO SURGEON STRL SZ7.5 (GLOVE) ×1 IMPLANT
GLOVE BIOGEL PI IND STRL 8 (GLOVE) ×1 IMPLANT
GLOVE SURG POLY ORTHO LF SZ7.5 (GLOVE) IMPLANT
GLOVE SURG UNDER LTX SZ8 (GLOVE) ×1 IMPLANT
GOWN STRL REUS W/ TWL LRG LVL3 (GOWN DISPOSABLE) ×2 IMPLANT
GOWN STRL REUS W/TWL LRG LVL3 (GOWN DISPOSABLE) ×2
KIT BASIN OR (CUSTOM PROCEDURE TRAY) ×1 IMPLANT
KIT PALINDROME-P 55CM (CATHETERS) IMPLANT
KIT TURNOVER KIT B (KITS) ×1 IMPLANT
NDL 18GX1X1/2 (RX/OR ONLY) (NEEDLE) ×1 IMPLANT
NDL HYPO 25GX1X1/2 BEV (NEEDLE) ×1 IMPLANT
NEEDLE 18GX1X1/2 (RX/OR ONLY) (NEEDLE) ×1 IMPLANT
NEEDLE HYPO 25GX1X1/2 BEV (NEEDLE) ×1 IMPLANT
NS IRRIG 1000ML POUR BTL (IV SOLUTION) ×1 IMPLANT
PACK BASIC III (CUSTOM PROCEDURE TRAY) ×1
PACK SRG BSC III STRL LF ECLPS (CUSTOM PROCEDURE TRAY) ×1 IMPLANT
PAD ARMBOARD 7.5X6 YLW CONV (MISCELLANEOUS) ×2 IMPLANT
SET MICROPUNCTURE 5F STIFF (MISCELLANEOUS) IMPLANT
SUT ETHILON 3 0 PS 1 (SUTURE) ×1 IMPLANT
SUT MNCRL AB 4-0 PS2 18 (SUTURE) ×1 IMPLANT
SYR 10ML LL (SYRINGE) ×1 IMPLANT
SYR 20ML LL LF (SYRINGE) ×2 IMPLANT
SYR 5ML LL (SYRINGE) ×2 IMPLANT
SYR CONTROL 10ML LL (SYRINGE) ×1 IMPLANT
TOWEL GREEN STERILE (TOWEL DISPOSABLE) ×2 IMPLANT
TOWEL GREEN STERILE FF (TOWEL DISPOSABLE) ×1 IMPLANT
WATER STERILE IRR 1000ML POUR (IV SOLUTION) ×1 IMPLANT

## 2022-01-04 NOTE — Op Note (Signed)
    NAME: [name]    MRN: [id number] DOB: [birth year]    DATE OF OPERATION: 01/04/2022  PREOP DIAGNOSIS:    End-stage renal disease  POSTOP DIAGNOSIS:    Same  PROCEDURE:    Ultrasound-guided placement of 19 cm right IJ tunneled dialysis catheter  SURGEON: Judeth Cornfield. Scot Dock, MD  ANESTHESIA: General  EBL: Minimal  INDICATIONS:    [name] is a 56 y.o. female who presents for placement of a dialysis catheter so that she can begin dialysis.  FINDINGS:   Patent right IJ  TECHNIQUE:   The patient was brought to the operating room and received a general anesthetic.  The right IJ was patent by ultrasound.  The neck and chest were prepped and draped in usual sterile fashion.  I selected a 19 cm catheter.  Under ultrasound guidance the right IJ was cannulated and a micropuncture wire introduced under fluoroscopy.  The micropuncture sheath was advanced over the wire and then the micropuncture wire exchanged for the J-wire which was positioned into the right atrium.  Next the exit site for the catheter was selected.  The catheter was brought through the tunnel.  The tract over the wire was dilated and then the dilator and peel-away sheath were advanced over the wire and the wire and dilator removed.  The catheter was passed through the peel-away sheath and positioned at the cavoatrial junction.  Both ports withdrew easily.  Catheter was then flushed with heparinized saline.  The catheter was secured at its exit site with a 3-0 nylon suture.  The IJ cannulation site was closed with a 4-0 Monocryl.  A sterile dressing was applied.  The patient tolerated the procedure well and was transferred to recovery room in stable condition.  All needle and sponge counts were correct.  Deitra Mayo, MD, FACS [facility]  DATE OF DICTATION:   01/04/2022

## 2022-01-04 NOTE — Transfer of Care (Signed)
Immediate Anesthesia Transfer of Care Note  Patient: [name]  Procedure(s) Performed: INSERTION OF TUNNELED DIALYSIS CATHETER (Right: Neck)  Patient Location: PACU  Anesthesia Type:General  Level of Consciousness: awake and drowsy  Airway & Oxygen Therapy: Patient Spontanous Breathing and Patient connected to face mask oxygen  Post-op Assessment: Report given to RN and Post -op Vital signs reviewed and stable  Post vital signs: Reviewed and stable  Last Vitals:  Vitals Value Taken Time  BP 121/67 01/04/22 1145  Temp    Pulse 75 01/04/22 1148  Resp 15 01/04/22 1148  SpO2 94 % 01/04/22 1148  Vitals shown include unvalidated device data.  Last Pain:  Vitals:   01/04/22 0810  TempSrc: Oral  PainSc:          Complications: No notable events documented.

## 2022-01-04 NOTE — Progress Notes (Signed)
[facility] Progress Note   56 y.o. female DM, obesity, HTN, CKD unknown stage, presenting with a wound in her right foot. She actually was trying to shave a callous a few weeks ago and ended up cutting herself seen in the ED recently and sent home with Clindamycin. Wound cultures grew out Staph warnerii, CNS, and Strep anginosis; unfortunately the wound did not improve and she came back to the ED with foul smelling drainage. Xrays showed chronic Charcot foot and the patient was started on Vancomycin and Cefepime. Creatinine was noted to be 12.5 initially and has been elevated for over 2 mths; Creatinine was 9.26 10/22/2021 and prior to that the last labs were on 03/11/2018 w/ a creatinine of 1.31.     Assessment/ Plan:   Renal failure - suspicious for chronic progression with a creatinine that was already 9.26 on 10/22/2021. She stated that her PMD is Dr. [name] [phone]) but they do not have any labs outside of what is available on Epic. It appears she was lost to f/u for many years and has had progression of her kidney disease. Really no need to repeat the renal ultrasound as she just had one on 12/27/2021 which showed low volume kidneys, hyperechogenic. Fortunately she doesn't have any absolute indications for dialysis. She was resistant to having a TC to initiate dialysis and kept stating that she wants home dialysis. I think someone (likely primary) has told her about nearing dialysis as she has been looking on Youtube about ESRD and dialysis access. Non active sediment in urine. UPC 4.5 (elevated with ESRD) - Hold Lisinopril/HCTZ, Lasix as well. -  HCO3 only 10 and she needs to initiate dialysis; appreciate the RIJ TC by Dr. Scot Dock on 11/10. HCO3 2 tabs BID to be d/c in next few days once HCO3 is stabilized. - Will continue dialysis education; permanent access eventually likely as outpt. Dialysis #1 today and #2 Saturday. - Obtained vein mapping for her but per Dr Scot Dock  permanent access only after her PAD + foot infection is treated adequately; she is left hand dominant.  HTN  - Norvasc 5mg  daily for now; UF with RRT will help as well Renal osteodystrophy w/ vit d deficiency - Phos 6.9 (started renvela), 25VitD 11.7 (started calcitriol 58mcg daily) and iPTH 281 (on calcitriol); Foot wound - per primary Anemia - 1 U PRBC 11/8; TSAT 32% F only 87 but with infection can't give IV iron. CRP 1.9 DM  Subjective:   Very sleepy after the surgery but following commands.   Objective:   BP (!) 163/95 (BP Location: Right Arm)   Pulse 75   Temp (!) 97.5 F (36.4 C) (Oral)   Resp 17   Ht 5\' 6"  (1.676 m)   Wt 108.9 kg   LMP 05/26/2017   SpO2 100%   BMI 38.74 kg/m   Intake/Output Summary (Last 24 hours) at 01/04/2022 1256 Last data filed at 01/04/2022 1120 Gross per 24 hour  Intake 603 ml  Output 5 ml  Net 598 ml   Weight change:   Physical Exam: General appearance: NCAT Eyes: conj pallor Neck: no adenopathy, no JVD Back: No CVA tenderness. Resp: clear to auscultation bilaterally Cardio: regular rate and rhythm GI: SNDNT + BS Extremities: edema woody; wound in bottom of right foot Access: RIJ TC  Imaging: DG Chest Port 1 View  Result Date: 01/04/2022 CLINICAL DATA:  End-stage renal disease. Dialysis catheter placement. EXAM: PORTABLE CHEST 1 VIEW COMPARISON:  Chest radiograph dated  Jul 14, 2021 FINDINGS: The heart is enlarged. Elevation of the right hemidiaphragm, unchanged. Lungs are otherwise clear without evidence of focal consolidation or pleural effusion. Right IJ double-lumen dialysis catheter with distal tip in the SVC. No acute osseous abnormality. IMPRESSION: 1. Right IJ double-lumen dialysis catheter with distal tip in the SVC. 2. Cardiomegaly. Electronically Signed   By: Keane Police D.O.   On: 01/04/2022 12:03   DG C-Arm 1-60 Min-No Report  Result Date: 01/04/2022 Fluoroscopy was utilized by the requesting physician.  No radiographic  interpretation.    Labs: BMET Recent Labs  Lab 01/01/22 0542 01/01/22 1834 01/02/22 0100 01/04/22 0336 01/04/22 0716  NA 139  143  --  143 137 144  K 3.2*  3.2*  --  3.1* 2.7* 2.8*  CL 113*  118*  --  119* 111 113*  CO2 10*  --  11* 13*  --   GLUCOSE 96  93  --  79 85 71  BUN 68*  66*  --  61* 62* 58*  CREATININE 10.93*  12.50*  --  9.23* 10.39* 11.60*  CALCIUM 5.8*  --  5.4* 6.3*  --   PHOS  --  7.8* 6.9* 7.3*  --    CBC Recent Labs  Lab 01/01/22 0542 01/02/22 0100 01/03/22 0857 01/04/22 0716  WBC 6.9 4.9 5.8  --   NEUTROABS 4.5  --   --   --   HGB 9.3*  10.5* 6.8* 9.6* 10.5*  HCT 29.1*  31.0* 21.8* 28.7* 31.0*  MCV 90.7 92.0 87.8  --   PLT 277 222 230  --     Medications:     sodium chloride   Intravenous Once   calcitRIOL  1 mcg Oral Daily   calcium carbonate  1,250 mg Oral Q breakfast   Chlorhexidine Gluconate Cloth  6 each Topical Q0600   gentamicin ointment   Topical BID   heparin  5,000 Units Subcutaneous Q8H   metroNIDAZOLE  500 mg Oral Q12H   nutrition supplement (JUVEN)  1 packet Oral BID BM   saccharomyces boulardii  250 mg Oral BID   sevelamer carbonate  800 mg Oral TID WC   sodium bicarbonate  1,300 mg Oral BID      Otelia Santee, MD 01/04/2022, 12:56 PM

## 2022-01-04 NOTE — Plan of Care (Signed)
  Problem: Education: Goal: Knowledge of General Education information will improve Description: Including pain rating scale, medication(s)/side effects and non-pharmacologic comfort measures Outcome: Progressing   Problem: Health Behavior/Discharge Planning: Goal: Ability to manage health-related needs will improve Outcome: Progressing   Problem: Clinical Measurements: Goal: Diagnostic test results will improve Outcome: Progressing   Problem: Activity: Goal: Risk for activity intolerance will decrease Outcome: Progressing   Problem: Coping: Goal: Level of anxiety will decrease Outcome: Progressing

## 2022-01-04 NOTE — Progress Notes (Signed)
Lab called to report critical potassium of 2.8 and critical calcium of 6.3. Informed on call hospitalist and instructed this RN to hold off potassium replacement for now.

## 2022-01-04 NOTE — Anesthesia Procedure Notes (Signed)
Procedure Name: LMA Insertion Date/Time: 01/04/2022 10:45 AM  Performed by: Dorann Lodge, CRNAPre-anesthesia Checklist: Patient identified, Emergency Drugs available, Suction available and Patient being monitored Patient Re-evaluated:Patient Re-evaluated prior to induction Oxygen Delivery Method: Circle System Utilized Preoxygenation: Pre-oxygenation with 100% oxygen Induction Type: IV induction Ventilation: Mask ventilation without difficulty LMA: LMA inserted LMA Size: 4.0 Number of attempts: 1 Airway Equipment and Method: Bite block Placement Confirmation: positive ETCO2 Tube secured with: Tape Dental Injury: Teeth and Oropharynx as per pre-operative assessment

## 2022-01-04 NOTE — Anesthesia Postprocedure Evaluation (Signed)
Anesthesia Post Note  Patient: [name]  Procedure(s) Performed: INSERTION OF TUNNELED DIALYSIS CATHETER (Right: Neck)     Patient location during evaluation: PACU Anesthesia Type: General Level of consciousness: awake and alert Pain management: pain level controlled Vital Signs Assessment: post-procedure vital signs reviewed and stable Respiratory status: spontaneous breathing, nonlabored ventilation and respiratory function stable Cardiovascular status: blood pressure returned to baseline and stable Postop Assessment: no apparent nausea or vomiting Anesthetic complications: no   No notable events documented.  Last Vitals:  Vitals:   01/04/22 1145 01/04/22 1200  BP: 121/67 (!) 147/77  Pulse: 77 74  Resp: 17 16  Temp:    SpO2: 96% 95%    Last Pain:  Vitals:   01/04/22 1200  TempSrc:   PainSc: 0-No pain                 Lidia Collum

## 2022-01-04 NOTE — Anesthesia Preprocedure Evaluation (Addendum)
Anesthesia Evaluation  Patient identified by MRN, date of birth, ID band Patient awake    Reviewed: Allergy & Precautions, NPO status , Patient's Chart, lab work & pertinent test results  History of Anesthesia Complications Negative for: history of anesthetic complications  Airway Mallampati: II  TM Distance: >3 FB Neck ROM: Full    Dental  (+) Poor Dentition, Dental Advisory Given   Pulmonary asthma , former smoker   Pulmonary exam normal        Cardiovascular hypertension, Normal cardiovascular exam     Neuro/Psych negative neurological ROS     GI/Hepatic negative GI ROS, Neg liver ROS,,,  Endo/Other  diabetes, Type 2    Renal/GU ESRFRenal disease  negative genitourinary   Musculoskeletal negative musculoskeletal ROS (+)    Abdominal   Peds  Hematology  (+) Blood dyscrasia, anemia   Anesthesia Other Findings   Reproductive/Obstetrics                             Anesthesia Physical Anesthesia Plan  ASA: 3  Anesthesia Plan: General   Post-op Pain Management: Minimal or no pain anticipated   Induction: Intravenous  PONV Risk Score and Plan: 1 and Ondansetron, Dexamethasone, Midazolam and Treatment may vary due to age or medical condition  Airway Management Planned: LMA  Additional Equipment: None  Intra-op Plan:   Post-operative Plan: Extubation in OR  Informed Consent: I have reviewed the patients History and Physical, chart, labs and discussed the procedure including the risks, benefits and alternatives for the proposed anesthesia with the patient or authorized representative who has indicated his/her understanding and acceptance.     Dental advisory given  Plan Discussed with:   Anesthesia Plan Comments:         Anesthesia Quick Evaluation

## 2022-01-04 NOTE — Interval H&P Note (Signed)
History and Physical Interval Note:  01/04/2022 9:58 AM  [name]  has presented today for surgery, with the diagnosis of CKD V.  The various methods of treatment have been discussed with the patient and family. After consideration of risks, benefits and other options for treatment, the patient has consented to  Procedure(s): INSERTION OF TUNNELED DIALYSIS CATHETER (N/A) as a surgical intervention.  The patient's history has been reviewed, patient examined, no change in status, stable for surgery.  I have reviewed the patient's chart and labs.  Questions were answered to the patient's satisfaction.     Deitra Mayo

## 2022-01-04 NOTE — Progress Notes (Signed)
Hypoglycemic Event  CBG: 76   Treatment: D50 25 mL (12.5 gm)  Symptoms: None  Follow-up CBG: Time:1220 CBG Result:104  Possible Reasons for Event: Inadequate meal intake and Other: npo for surg  Comments/MD notified:yes    Signe Colt

## 2022-01-04 NOTE — Progress Notes (Addendum)
PROGRESS NOTE    [name]  SJG:[id number] DOB: [birth year] DOA: 12/31/2021 PCP: [name], MD   Brief Narrative:  [name] is a 56 y.o. female with medical history significant of diabetes mellitus type 2, CKD stage V, and obesity who presents for worsening pain and a wound with foul smell of her right foot.    She has been seen in in the emergency department on 10/21 for the same and given imaging/findings at that time was appropriately sent home with clindamycin which she reports she completed.  Wound cultures at the time noted Staphylococcus warneri, Streptococcus anginosis, and Staphylococcus species. X-rays of the right foot at intake noted to the plantar aspect of the foot with signs of bone involvement for which findings were consistent with chronic Charcot foot.  Assessment & Plan:   Principal Problem:   Diabetic foot infection ([place]) Active Problems:   Hypocalcemia   Hypokalemia   Chronic kidney disease (CKD), stage V (HCC)   Metabolic acidosis, increased anion gap   Diarrhea   HTN (hypertension)   Diabetes mellitus with hyperglycemia, without long-term current use of insulin (HCC)   Obesity (BMI 30-39.9)   Ulcer of right foot, with necrosis of muscle ([place])   Charcot's arthropathy   Diabetic foot wound infection, failure of outpatient antibiotics Rule out bacteremia Patient does not meet sepsis criteria -Previously on clindamycin, reports completion but other medication compliance has otherwise been an issue -Podiatry following -no indication for surgery based on imaging, patient is okay to be discharged from podiatric standpoint -continue antibiotics -MRI confirms severe Charcot arthropathy -with notable cellulitis and no definitive abscess or draining tract.  Osteomyelitis is less likely. -Continue cefepime - DC daptomycin, Flagyl per discussion with ID until blood cultures result -ESR 46, CRP 1.9 -Cultures pending (previous cultures as above showed  multiple strep and staph species)   CKD stage V - acutely worsening, likely advancing to ESRD -Patient initially refusing HD/access but now agreeable 11/9 -Vascular to place [facility] 11/10 -Nephro following -appreciate insight recommendations -Vancomycin discontinued as above(now on Dapto) -Baseline creatinine around 9, GFR less than 10, poor follow-up in the outpatient setting prior to hospitalization.   Acute on chronic anemia likely of chronic disease -Likely secondary to acute illness and infection as above -Exacerbated by CKD stage V -No current signs or symptoms of bleeding  Hypocalcemia -Status post IV calcium gluconate, continue p.o. calcium -Continue to monitor and replace as needed   Hypokalemia -Continue to follow, labs pending given patient's earlier refusal -K this am critically low at 2.9 - hoping to get [facility] placed for HD - defer to nephrology for K this am given CKD5/ESRD status.   Metabolic acidosis with elevated anion gap -Secondary to uremia. -Likely to improve with dialysis   Diarrhea -Improving, likely secondary to previous prolonged antibiotic course -Follow I's and O's, hold off on testing at this time given no fever no white count   Noncompliance  -Patient occasionally refusing labs, intervention, medical care making treatment difficult. We discussed if she does not want to undergo care we could discuss palliative care but this was met with resistance.  - Patient initially even declined HD and TDC placement which we discussed was not advised given her diminished kidney function and would likely cause her continued decline and ultimately her death. She is now agreeable to HD/access.  Essential hypertension -Blood pressures likely to improve with dialysis -Held furosemide and lisinopril-hydrochlorothiazide due to kidney function -Hydralazine IV as needed   Controlled diabetes mellitus  type 2, without long-term use of insulin -Hemoglobin A1c was 6 on  12/21/2021 -Hypoglycemic protocol -Hold glipizide and metformin -Renal and carb modified diet -Consider placing on sensitive SSI if blood sugars noted to increase.   Obesity BMI 38.74 kg/m  DVT prophylaxis: Heparin Code Status: Full Family Communication: None present  Status is: Inpatient  Dispo: The patient is from: Home              Anticipated d/c is to: Home              Anticipated d/c date is: Pending above              Patient currently not medically stable for discharge  Consultants:  Podiatry, nephrology, vascular surgery  Procedures:  Pending above  Antimicrobials:  Cefepime, daptomycin, Flagyl  Subjective: No acute issues or events overnight  Objective: Vitals:   01/03/22 0455 01/03/22 0743 01/03/22 0744 01/03/22 2120  BP: (!) 155/81   (!) 158/83  Pulse: 76   86  Resp:    16  Temp: 98.7 F (37.1 C) 98.1 F (36.7 C)  98.6 F (37 C)  TempSrc: Oral Oral  Oral  SpO2: 95%  98% 100%  Weight:      Height:        Intake/Output Summary (Last 24 hours) at 01/04/2022 0752 Last data filed at 01/04/2022 0500 Gross per 24 hour  Intake 543 ml  Output --  Net 543 ml    Filed Weights   01/01/22 0542  Weight: 108.9 kg    Examination:  General:  Pleasantly resting in bed, No acute distress. HEENT:  Normocephalic atraumatic.  Sclerae nonicteric, noninjected.  Extraocular movements intact bilaterally. Neck:  Without mass or deformity.  Trachea is midline. Lungs:  Clear to auscultate bilaterally without rhonchi, wheeze, or rales. Heart:  Regular rate and rhythm.  Without murmurs, rubs, or gallops. Abdomen:  Soft, nontender, nondistended.  Without guarding or rebound. Extremities: Right foot wound, POA Skin:  Warm and dry, no erythema, R foot lesion/wound as above  Data Reviewed: I have personally reviewed following labs and imaging studies  CBC: Recent Labs  Lab 01/01/22 0542 01/02/22 0100 01/03/22 0857 01/04/22 0716  WBC 6.9 4.9 5.8  --    NEUTROABS 4.5  --   --   --   HGB 9.3*  10.5* 6.8* 9.6* 10.5*  HCT 29.1*  31.0* 21.8* 28.7* 31.0*  MCV 90.7 92.0 87.8  --   PLT 277 222 230  --     Basic Metabolic Panel: Recent Labs  Lab 01/01/22 0542 01/01/22 1834 01/02/22 0100 01/04/22 0336 01/04/22 0716  NA 139  143  --  143 137 144  K 3.2*  3.2*  --  3.1* 2.7* 2.8*  CL 113*  118*  --  119* 111 113*  CO2 10*  --  11* 13*  --   GLUCOSE 96  93  --  79 85 71  BUN 68*  66*  --  61* 62* 58*  CREATININE 10.93*  12.50*  --  9.23* 10.39* 11.60*  CALCIUM 5.8*  --  5.4* 6.3*  --   PHOS  --  7.8* 6.9* 7.3*  --     GFR: Estimated Creatinine Clearance: 6.9 mL/min (A) (by C-G formula based on SCr of 11.6 mg/dL (H)). Liver Function Tests: Recent Labs  Lab 01/01/22 0542 01/02/22 0100 01/04/22 0336  AST 18  --   --   ALT 10  --   --  ALKPHOS 74  --   --   BILITOT 0.5  --   --   PROT 8.0  --   --   ALBUMIN 3.4* 2.2* 2.7*    No results for input(s): "LIPASE", "AMYLASE" in the last 168 hours. No results for input(s): "AMMONIA" in the last 168 hours. Coagulation Profile: No results for input(s): "INR", "PROTIME" in the last 168 hours. Cardiac Enzymes: Recent Labs  Lab 01/03/22 0857  CKTOTAL 172   BNP (last 3 results) No results for input(s): "PROBNP" in the last 8760 hours. HbA1C: No results for input(s): "HGBA1C" in the last 72 hours. CBG: No results for input(s): "GLUCAP" in the last 168 hours. Lipid Profile: No results for input(s): "CHOL", "HDL", "LDLCALC", "TRIG", "CHOLHDL", "LDLDIRECT" in the last 72 hours. Thyroid Function Tests: No results for input(s): "TSH", "T4TOTAL", "FREET4", "T3FREE", "THYROIDAB" in the last 72 hours. Anemia Panel: Recent Labs    01/01/22 1834  FERRITIN 87  TIBC 220*  IRON 70    Sepsis Labs: Recent Labs  Lab 01/01/22 0543  LATICACIDVEN 1.1    Recent Results (from the past 240 hour(s))  Blood culture (routine x 2)     Status: None (Preliminary result)   Collection  Time: 01/01/22  5:44 AM   Specimen: BLOOD  Result Value Ref Range Status   Specimen Description BLOOD BLOOD LEFT WRIST  Final   Special Requests   Final    BOTTLES DRAWN AEROBIC AND ANAEROBIC Blood Culture results may not be optimal due to an inadequate volume of blood received in culture bottles   Culture   Final    NO GROWTH 2 DAYS Performed at [facility], [place] [address], [place], [place] [address]    Report Status PENDING  Incomplete  Blood culture (routine x 2)     Status: None (Preliminary result)   Collection Time: 01/01/22  6:58 AM   Specimen: BLOOD RIGHT FOREARM  Result Value Ref Range Status   Specimen Description BLOOD RIGHT FOREARM  Final   Special Requests   Final    BOTTLES DRAWN AEROBIC AND ANAEROBIC Blood Culture results may not be optimal due to an inadequate volume of blood received in culture bottles   Culture  Setup Time   Final    GRAM NEGATIVE RODS ANAEROBIC BOTTLE ONLY CRITICAL RESULT CALLED TO, READ BACK BY AND VERIFIED WITH: PHARMD J.FRENS AT 1133 ON 01/02/2022 BY T.SAAD.    Culture   Final    GRAM NEGATIVE RODS SENT TO [facility] FOR IDENTIFICATION AND SUSCEPTIBILITY TESTING. Performed at [facility], [place] [address] [place], [place] [address]    Report Status PENDING  Incomplete  Blood Culture ID Panel (Reflexed)     Status: None   Collection Time: 01/01/22  6:58 AM  Result Value Ref Range Status   Enterococcus faecalis NOT DETECTED NOT DETECTED Final   Enterococcus Faecium NOT DETECTED NOT DETECTED Final   Listeria monocytogenes NOT DETECTED NOT DETECTED Final   Staphylococcus species NOT DETECTED NOT DETECTED Final   Staphylococcus aureus (BCID) NOT DETECTED NOT DETECTED Final   Staphylococcus epidermidis NOT DETECTED NOT DETECTED Final   Staphylococcus lugdunensis NOT DETECTED NOT DETECTED Final   Streptococcus species NOT DETECTED NOT DETECTED Final   Streptococcus agalactiae NOT DETECTED NOT DETECTED Final   Streptococcus pneumoniae  NOT DETECTED NOT DETECTED Final   Streptococcus pyogenes NOT DETECTED NOT DETECTED Final   A.calcoaceticus-baumannii NOT DETECTED NOT DETECTED Final   Bacteroides fragilis NOT DETECTED NOT DETECTED  Final   Enterobacterales NOT DETECTED NOT DETECTED Final   Enterobacter cloacae complex NOT DETECTED NOT DETECTED Final   Escherichia coli NOT DETECTED NOT DETECTED Final   Klebsiella aerogenes NOT DETECTED NOT DETECTED Final   Klebsiella oxytoca NOT DETECTED NOT DETECTED Final   Klebsiella pneumoniae NOT DETECTED NOT DETECTED Final   Proteus species NOT DETECTED NOT DETECTED Final   Salmonella species NOT DETECTED NOT DETECTED Final   Serratia marcescens NOT DETECTED NOT DETECTED Final   Haemophilus influenzae NOT DETECTED NOT DETECTED Final   Neisseria meningitidis NOT DETECTED NOT DETECTED Final   Pseudomonas aeruginosa NOT DETECTED NOT DETECTED Final   Stenotrophomonas maltophilia NOT DETECTED NOT DETECTED Final   Candida albicans NOT DETECTED NOT DETECTED Final   Candida auris NOT DETECTED NOT DETECTED Final   Candida glabrata NOT DETECTED NOT DETECTED Final   Candida krusei NOT DETECTED NOT DETECTED Final   Candida parapsilosis NOT DETECTED NOT DETECTED Final   Candida tropicalis NOT DETECTED NOT DETECTED Final   Cryptococcus neoformans/gattii NOT DETECTED NOT DETECTED Final    Comment: Performed at [facility], [place] [address] [place], [place] [address]     Radiology Studies: VAS [place] UPPER EXT VEIN MAPPING (PRE-OP AVF)  Result Date: 01/02/2022 UPPER EXTREMITY VEIN MAPPING Patient Name:  CAITLEN WORTH  Date of Exam:   01/02/2022 Medical Rec #: [id number]        Accession #:    [id number] Date of Birth: 1965-11-05        Patient Gender: F Patient Age:   70 years Exam Location:  [facility] Procedure:      VAS [place] UPPER EXT VEIN MAPPING (PRE-OP AVF) Referring Phys: Otelia Santee --------------------------------------------------------------------------------  Indications:  Pre-access. Limitations: IV/Bandage to BUE Comparison Study: No previous exams Performing Technologist: Jody Hill RVT, RDMS  Examination Guidelines: A complete evaluation includes B-mode imaging, spectral Doppler, color Doppler, and power Doppler as needed of all accessible portions of each vessel. Bilateral testing is considered an integral part of a complete examination. Limited examinations for reoccurring indications may be performed as noted. +-----------------+-------------+----------+----------------------+ Right Cephalic   Diameter (cm)Depth (cm)       Findings        +-----------------+-------------+----------+----------------------+ Shoulder             0.24        0.25                          +-----------------+-------------+----------+----------------------+ Prox upper arm       0.23        0.85                          +-----------------+-------------+----------+----------------------+ Mid upper arm        0.18        0.80                          +-----------------+-------------+----------+----------------------+ Dist upper arm       0.10        0.62                          +-----------------+-------------+----------+----------------------+ Antecubital fossa    0.16        0.29                          +-----------------+-------------+----------+----------------------+  Prox forearm         0.34        0.47   branching and thrombus +-----------------+-------------+----------+----------------------+ Mid forearm                             not visualized and IV  +-----------------+-------------+----------+----------------------+ Dist forearm                            not visualized and IV  +-----------------+-------------+----------+----------------------+ Wrist                                       not visualized     +-----------------+-------------+----------+----------------------+ +-----------------+-------------+----------+---------+ Right Basilic     Diameter (cm)Depth (cm)Findings  +-----------------+-------------+----------+---------+ Prox upper arm       0.50                         +-----------------+-------------+----------+---------+ Mid upper arm        0.29                         +-----------------+-------------+----------+---------+ Dist upper arm       0.36                         +-----------------+-------------+----------+---------+ Antecubital fossa    0.41               branching +-----------------+-------------+----------+---------+ Prox forearm         0.16                         +-----------------+-------------+----------+---------+ Mid forearm          0.14                         +-----------------+-------------+----------+---------+ Distal forearm       0.14                         +-----------------+-------------+----------+---------+ Elbow                0.10                         +-----------------+-------------+----------+---------+ +-----------------+-------------+----------+---------------------+ Left Cephalic    Diameter (cm)Depth (cm)      Findings        +-----------------+-------------+----------+---------------------+ Shoulder             0.32        1.30                         +-----------------+-------------+----------+---------------------+ Prox upper arm       0.28        1.26                         +-----------------+-------------+----------+---------------------+ Mid upper arm        0.21        0.75                         +-----------------+-------------+----------+---------------------+ Dist upper arm       0.16  0.76         branching       +-----------------+-------------+----------+---------------------+ Antecubital fossa    0.17        0.48                         +-----------------+-------------+----------+---------------------+ Prox forearm                            not visualized and IV  +-----------------+-------------+----------+---------------------+ Mid forearm          0.18        0.77                         +-----------------+-------------+----------+---------------------+ Dist forearm         0.16        0.38                         +-----------------+-------------+----------+---------------------+ Wrist                0.11        0.43                         +-----------------+-------------+----------+---------------------+ +-----------------+-------------+----------+---------------------+ Left Basilic     Diameter (cm)Depth (cm)      Findings        +-----------------+-------------+----------+---------------------+ Prox upper arm       0.61                                     +-----------------+-------------+----------+---------------------+ Mid upper arm        0.23                                     +-----------------+-------------+----------+---------------------+ Dist upper arm       0.22                                     +-----------------+-------------+----------+---------------------+ Antecubital fossa    0.16                     branching       +-----------------+-------------+----------+---------------------+ Prox forearm                            not visualized and IV +-----------------+-------------+----------+---------------------+ Mid forearm          0.08                                     +-----------------+-------------+----------+---------------------+ Distal forearm       0.08                                     +-----------------+-------------+----------+---------------------+ Wrist                0.07                                     +-----------------+-------------+----------+---------------------+ *  See table(s) above for measurements and observations.  Diagnosing physician: Jamelle Haring Electronically signed by Jamelle Haring on 01/02/2022 at 3:53:57 PM.    Final    VAS [place] ABI WITH/WO TBI  Result  Date: 01/02/2022  LOWER EXTREMITY DOPPLER STUDY Patient Name:  ASHAUNA BERTHOLF  Date of Exam:   01/02/2022 Medical Rec #: [id number]        Accession #:    [id number] Date of Birth: January 19, 1966        Patient Gender: F Patient Age:   85 years Exam Location:  [facility] Procedure:      VAS [place] ABI WITH/WO TBI Referring Phys: Fuller Plan --------------------------------------------------------------------------------  Indications: DM foot infection RLE High Risk Factors: Hypertension, Diabetes, past history of smoking. Other Factors: CKDV.  Comparison Study: No previous Performing Technologist: Hill, Jody RVT, RDMS  Examination Guidelines: A complete evaluation includes at minimum, Doppler waveform signals and systolic blood pressure reading at the level of bilateral brachial, anterior tibial, and posterior tibial arteries, when vessel segments are accessible. Bilateral testing is considered an integral part of a complete examination. Photoelectric Plethysmograph (PPG) waveforms and toe systolic pressure readings are included as required and additional duplex testing as needed. Limited examinations for reoccurring indications may be performed as noted.  ABI Findings: +---------+------------------+-----+---------+--------+ Right    Rt Pressure (mmHg)IndexWaveform Comment  +---------+------------------+-----+---------+--------+ Brachial 169                    triphasic         +---------+------------------+-----+---------+--------+ PTA                             biphasic >255 [place]  +---------+------------------+-----+---------+--------+ DP                              biphasic >255 [place]  +---------+------------------+-----+---------+--------+ Great Toe                       Abnormal >255 [place]  +---------+------------------+-----+---------+--------+ +---------+------------------+-----+---------+-------+ Left     Lt Pressure (mmHg)IndexWaveform Comment  +---------+------------------+-----+---------+-------+ Brachial 172                    triphasic        +---------+------------------+-----+---------+-------+ PTA                             biphasic >255 [place] +---------+------------------+-----+---------+-------+ DP                              biphasic >255 [place] +---------+------------------+-----+---------+-------+ Great Toe                       Abnormal >255 [place] +---------+------------------+-----+---------+-------+ +-------+-----------+-----------+------------+------------+ ABI/TBIToday's ABIToday's TBIPrevious ABIPrevious TBI +-------+-----------+-----------+------------+------------+ Right  [place]         [place]                                  +-------+-----------+-----------+------------+------------+ Left   [place]         [place]                                  +-------+-----------+-----------+------------+------------+ Arterial wall  calcification precludes accurate ankle pressures and ABIs.  Summary: Right: Resting right ankle-brachial index and toe-brachial index indicates noncompressible right lower extremity arteries. Left: Resting left ankle-brachial index and toe-brachial index indicates noncompressible left lower extremity arteries. *See table(s) above for measurements and observations.  Electronically signed by Jamelle Haring on 01/02/2022 at 3:53:11 PM.    Final     Scheduled Meds:  [MAR Hold] sodium chloride   Intravenous Once   [MAR Hold] calcitRIOL  1 mcg Oral Daily   [MAR Hold] calcium carbonate  1,250 mg Oral Q breakfast   [MAR Hold] Chlorhexidine Gluconate Cloth  6 each Topical Q0600   [MAR Hold] gentamicin ointment   Topical BID   [MAR Hold] heparin  5,000 Units Subcutaneous Q8H   [MAR Hold] metroNIDAZOLE  500 mg Oral Q12H   [MAR Hold] nutrition supplement (JUVEN)  1 packet Oral BID BM   [MAR Hold] saccharomyces boulardii  250 mg Oral BID   [MAR Hold] sevelamer carbonate  800 mg Oral TID WC   [MAR Hold]  sodium bicarbonate  1,300 mg Oral BID   Continuous Infusions:  [MAR Hold] ceFEPime (MAXIPIME) IV 1 g (01/03/22 1044)   [MAR Hold] DAPTOmycin (CUBICIN) 650 mg in sodium chloride 0.9 % IVPB 650 mg (01/03/22 2130)   potassium chloride       LOS: 3 days   Time spent: 109mn  Ailyn Gladd C Bacilio Abascal, DO [facility]  If 7PM-7AM, please contact night-coverage [email or web address]  01/04/2022, 7:52 AM

## 2022-01-04 NOTE — Plan of Care (Signed)

## 2022-01-04 NOTE — Progress Notes (Signed)
Made multiple attempts to reach CRNA Adrienne to give report but no answer. Patient transported to OR.

## 2022-01-05 ENCOUNTER — Encounter (HOSPITAL_COMMUNITY): Payer: Self-pay | Admitting: Vascular Surgery

## 2022-01-05 LAB — CBC
HCT: 27.5 % — ABNORMAL LOW (ref 36.0–46.0)
Hemoglobin: 8.8 g/dL — ABNORMAL LOW (ref 12.0–15.0)
MCH: 28.5 pg (ref 26.0–34.0)
MCHC: 32 g/dL (ref 30.0–36.0)
MCV: 89 fL (ref 80.0–100.0)
Platelets: 262 10*3/uL (ref 150–400)
RBC: 3.09 MIL/uL — ABNORMAL LOW (ref 3.87–5.11)
RDW: 14.9 % (ref 11.5–15.5)
WBC: 6.1 10*3/uL (ref 4.0–10.5)
nRBC: 0.3 % — ABNORMAL HIGH (ref 0.0–0.2)

## 2022-01-05 LAB — RENAL FUNCTION PANEL
Albumin: 2.8 g/dL — ABNORMAL LOW (ref 3.5–5.0)
Anion gap: 14 (ref 5–15)
BUN: 66 mg/dL — ABNORMAL HIGH (ref 6–20)
CO2: 14 mmol/L — ABNORMAL LOW (ref 22–32)
Calcium: 6.6 mg/dL — ABNORMAL LOW (ref 8.9–10.3)
Chloride: 110 mmol/L (ref 98–111)
Creatinine, Ser: 10.08 mg/dL — ABNORMAL HIGH (ref 0.44–1.00)
GFR, Estimated: 4 mL/min — ABNORMAL LOW (ref >60)
Glucose, Bld: 116 mg/dL — ABNORMAL HIGH (ref 70–99)
Phosphorus: 6.9 mg/dL — ABNORMAL HIGH (ref 2.5–4.6)
Potassium: 3.4 mmol/L — ABNORMAL LOW (ref 3.5–5.1)
Sodium: 138 mmol/L (ref 135–145)

## 2022-01-05 MED ORDER — HEPARIN SODIUM (PORCINE) 1000 UNIT/ML IJ SOLN
INTRAMUSCULAR | Status: AC
  Filled 2022-01-05: qty 4

## 2022-01-05 NOTE — Progress Notes (Signed)
[facility] Progress Note   56 y.o. female DM, obesity, HTN, CKD unknown stage, presenting with a wound in her right foot. She actually was trying to shave a callous a few weeks ago and ended up cutting herself seen in the ED recently and sent home with Clindamycin. Wound cultures grew out Staph warnerii, CNS, and Strep anginosis; unfortunately the wound did not improve and she came back to the ED with foul smelling drainage. Xrays showed chronic Charcot foot and the patient was started on Vancomycin and Cefepime. Creatinine was noted to be 12.5 initially and has been elevated for over 2 mths; Creatinine was 9.26 10/22/2021 and prior to that the last labs were on 03/11/2018 w/ a creatinine of 1.31.     Assessment/ Plan:   Renal failure - suspicious for chronic progression with a creatinine that was already 9.26 on 10/22/2021. She stated that her PMD is Dr. [name] [phone]) but they do not have any labs outside of what is available on Epic. It appears she was lost to f/u for many years and has had progression of her kidney disease. Really no need to repeat the renal ultrasound as she just had one on 12/27/2021 which showed low volume kidneys, hyperechogenic. Fortunately she doesn't have any absolute indications for dialysis. She was resistant to having a TC to initiate dialysis and kept stating that she wants home dialysis. I think someone (likely primary) has told her about nearing dialysis as she has been looking on Youtube about ESRD and dialysis access. Non active sediment in urine. UPC 4.5 (elevated with ESRD) - Hold Lisinopril/HCTZ, Lasix as well. Can restart Lisinopril now that she's on dialysis if needed and Lasix if she still voids/responds to help w/ interdialytic volume management. -  HCO3 only 10 and needed to initiate dialysis; appreciate the RIJ TC by Dr. Scot Dock on 11/10. HCO3 2 tabs BID to be d/c in next few days once HCO3 is stabilized. - Will continue dialysis  education; permanent access eventually likely as outpt.  - Refused dialysis on Friday; today will be #1 and plan on #2 Monday. She prefers eventually TTS but understands at least for next week MTTS given all the volume she has onboard.  - Obtained vein mapping for her but per Dr Scot Dock permanent access only after her PAD + foot infection is treated adequately; she is left hand dominant.  HTN  - Norvasc 5mg  daily for now; UF with RRT will help as well Renal osteodystrophy w/ vit d deficiency - Phos 6.9 (started renvela), 25VitD 11.7 (started calcitriol 61mcg daily) and iPTH 281 (on calcitriol); Foot wound - per primary Anemia - 1 U PRBC 11/8; TSAT 32% F only 87 but with infection can't give IV iron. CRP 1.9 DM  Subjective:   Awake this am and cooperative. Denies f/c/n/v.   Objective:   BP 137/75 (BP Location: Right Arm)   Pulse 84   Temp (!) 97.5 F (36.4 C) (Oral)   Resp 16   Ht 5\' 6"  (1.676 m)   Wt 108.9 kg   LMP 05/26/2017   SpO2 100%   BMI 38.74 kg/m   Intake/Output Summary (Last 24 hours) at 01/05/2022 0859 Last data filed at 01/04/2022 2100 Gross per 24 hour  Intake 900 ml  Output 5 ml  Net 895 ml   Weight change:   Physical Exam: General appearance: NCAT Eyes: conj pallor Neck: no adenopathy, no JVD Back: No CVA tenderness. Resp: clear to auscultation bilaterally Cardio: regular rate  and rhythm GI: SNDNT + BS Extremities: edema woody; wound in bottom of right foot Access: RIJ TC  Imaging: DG Chest Port 1 View  Result Date: 01/04/2022 CLINICAL DATA:  End-stage renal disease. Dialysis catheter placement. EXAM: PORTABLE CHEST 1 VIEW COMPARISON:  Chest radiograph dated Jul 14, 2021 FINDINGS: The heart is enlarged. Elevation of the right hemidiaphragm, unchanged. Lungs are otherwise clear without evidence of focal consolidation or pleural effusion. Right IJ double-lumen dialysis catheter with distal tip in the SVC. No acute osseous abnormality. IMPRESSION: 1.  Right IJ double-lumen dialysis catheter with distal tip in the SVC. 2. Cardiomegaly. Electronically Signed   By: Keane Police D.O.   On: 01/04/2022 12:03   DG C-Arm 1-60 Min-No Report  Result Date: 01/04/2022 Fluoroscopy was utilized by the requesting physician.  No radiographic interpretation.    Labs: BMET Recent Labs  Lab 01/01/22 0542 01/01/22 1834 01/02/22 0100 01/04/22 0336 01/04/22 0716  NA 139  143  --  143 137 144  K 3.2*  3.2*  --  3.1* 2.7* 2.8*  CL 113*  118*  --  119* 111 113*  CO2 10*  --  11* 13*  --   GLUCOSE 96  93  --  79 85 71  BUN 68*  66*  --  61* 62* 58*  CREATININE 10.93*  12.50*  --  9.23* 10.39* 11.60*  CALCIUM 5.8*  --  5.4* 6.3*  --   PHOS  --  7.8* 6.9* 7.3*  --    CBC Recent Labs  Lab 01/01/22 0542 01/02/22 0100 01/03/22 0857 01/04/22 0716  WBC 6.9 4.9 5.8  --   NEUTROABS 4.5  --   --   --   HGB 9.3*  10.5* 6.8* 9.6* 10.5*  HCT 29.1*  31.0* 21.8* 28.7* 31.0*  MCV 90.7 92.0 87.8  --   PLT 277 222 230  --     Medications:     sodium chloride   Intravenous Once   calcitRIOL  1 mcg Oral Daily   Chlorhexidine Gluconate Cloth  6 each Topical Q0600   gentamicin ointment   Topical BID   heparin  5,000 Units Subcutaneous Q8H   nutrition supplement (JUVEN)  1 packet Oral BID BM   saccharomyces boulardii  250 mg Oral BID   sevelamer carbonate  800 mg Oral TID WC   sodium bicarbonate  1,300 mg Oral BID      Otelia Santee, MD 01/05/2022, 8:59 AM

## 2022-01-05 NOTE — Progress Notes (Signed)
Received patient in bed to unit.  Alert and oriented.  Informed consent signed and in chart.   Treatment initiated: 0937 Treatment completed: 1243  Patient tolerated well.  Transported back to the room  Alert, without acute distress.  Hand-off given to patient's nurse.   Access used: Catheter Access issues: none  Total UF removed: 2L Medication(s) given: none Post HD VS: 161/80,84,97.8,100% Post HD weight: 108.9kg   [facility]

## 2022-01-05 NOTE — Progress Notes (Addendum)
PROGRESS NOTE    [name]  CLE:[id number] DOB: Jun 20, 1965 DOA: 12/31/2021 PCP: [name], MD   Brief Narrative:  [name] is a 56 y.o. female with medical history significant of diabetes mellitus type 2, CKD stage V, and obesity who presents for worsening pain and a wound with foul smell of her right foot.    She has been seen in in the emergency department on 10/21 for the same and given imaging/findings at that time was appropriately sent home with clindamycin which she reports she completed.  Wound cultures at the time noted Staphylococcus warneri, Streptococcus anginosis, and Staphylococcus species. X-rays of the right foot at intake noted to the plantar aspect of the foot with signs of bone involvement for which findings were consistent with chronic Charcot foot.  Assessment & Plan:   Principal Problem:   Diabetic foot infection ([place]) Active Problems:   Hypocalcemia   Hypokalemia   Chronic kidney disease (CKD), stage V (HCC)   Metabolic acidosis, increased anion gap   Diarrhea   HTN (hypertension)   Diabetes mellitus with hyperglycemia, without long-term current use of insulin (HCC)   Obesity (BMI 30-39.9)   Ulcer of right foot, with necrosis of muscle ([place])   Charcot's arthropathy  Diabetic foot wound infection, failure of outpatient antibiotics Rule out bacteremia Patient does not meet sepsis criteria -Previously on clindamycin, reports completion but other medication compliance has otherwise been an issue -Podiatry signed off -no indication for surgery based on imaging -MRI confirms severe Charcot arthropathy -with notable cellulitis and no definitive abscess or draining tract. Osteomyelitis is less likely. -Continue cefepime - DC daptomycin/Flagyl per discussion with ID until blood cultures result -ESR 46, CRP 1.9 -Cultures pending  -Previous surgical cultures as above showed multiple strep and staph species -Blood culture show gram negative cocci  preliminarily   CKD stage V - acutely worsening, likely advancing to ESRD -Patient initially refusing HD/access but now agreeable 11/9 -Vascular to [facility] 11/10 -HD 11/10 and 11/11 - tolerating well -Nephro following -appreciate insight recommendations -Limited labs - previous labs with creatinine of 9, GFR 10 - prior labs to this were >75 years old  Acute on chronic anemia likely of chronic disease -Likely secondary to acute illness and infection as above -Exacerbated by CKD stage V -No current signs or symptoms of bleeding  Hypocalcemia -Status post IV calcium gluconate, continue p.o. calcium -Continue to monitor and replace as needed   Hypokalemia -Continue to follow, labs pending given patient's earlier refusal -K this am critically low at 2.9 - hoping to get [facility] placed for HD - defer to nephrology for K this am given CKD5/ESRD status.   Metabolic acidosis with elevated anion gap -Secondary to uremia. -Likely to improve with dialysis   Diarrhea -Improving, likely secondary to previous prolonged antibiotic course -Follow I's and O's, hold off on testing at this time given no fever no white count   Noncompliance  -Patient occasionally refusing labs, intervention, medical care making treatment difficult. We discussed if she does not want to undergo care we could discuss palliative care but this was met with resistance.  - Patient initially even declined HD and TDC placement which we discussed was not advised given her diminished kidney function and would likely cause her continued decline and ultimately her death. She is now agreeable to HD/access.  Essential hypertension -Blood pressures likely to improve with dialysis -Held furosemide and lisinopril-hydrochlorothiazide due to kidney function -Hydralazine IV as needed   Controlled diabetes mellitus type  2, without long-term use of insulin -Hemoglobin A1c was 6 on 12/21/2021 -Hypoglycemic protocol -Hold glipizide and  metformin -Renal and carb modified diet -Consider placing on sensitive SSI if blood sugars noted to increase.   Obesity BMI 38.74 kg/m  DVT prophylaxis: Heparin Code Status: Full Family Communication: None present  Status is: Inpatient  Dispo: The patient is from: Home              Anticipated d/c is to: Home              Anticipated d/c date is: Pending above              Patient currently not medically stable for discharge  Consultants:  Podiatry, nephrology, vascular surgery  Procedures:  Pending above  Antimicrobials:  Cefepime, daptomycin, Flagyl  Subjective: No acute issues or events overnight  Objective: Vitals:   01/04/22 1200 01/04/22 1241 01/04/22 2108 01/05/22 0514  BP: (!) 147/77 (!) 163/95 (!) 170/101 137/75  Pulse: 74 75 94 84  Resp: _0 Temp:  (!) 97.5 F (36.4 C) 97.7 F (36.5 C) (!) 97.5 F (36.4 C)  TempSrc:  Oral Oral Oral  SpO2: 95% 100% 100% 100%  Weight:      Height:        Intake/Output Summary (Last 24 hours) at 01/05/2022 0732 Last data filed at 01/04/2022 2100 Gross per 24 hour  Intake 900 ml  Output 5 ml  Net 895 ml    Filed Weights   01/01/22 0542  Weight: 108.9 kg    Examination:  General:  Pleasantly resting in bed, No acute distress. HEENT:  Normocephalic atraumatic.  Sclerae nonicteric, noninjected.  Extraocular movements intact bilaterally. Neck:  Without mass or deformity.  Trachea is midline. Lungs:  Clear to auscultate bilaterally without rhonchi, wheeze, or rales. Heart:  Regular rate and rhythm.  Without murmurs, rubs, or gallops. Abdomen:  Soft, nontender, nondistended.  Without guarding or rebound. Extremities: Right foot wound, POA Skin:  Warm and dry, no erythema, R foot lesion/wound as above  Data Reviewed: I have personally reviewed following labs and imaging studies  CBC: Recent Labs  Lab 01/01/22 0542 01/02/22 0100 01/03/22 0857 01/04/22 0716  WBC 6.9 4.9 5.8  --   NEUTROABS 4.5  --    --   --   HGB 9.3*  10.5* 6.8* 9.6* 10.5*  HCT 29.1*  31.0* 21.8* 28.7* 31.0*  MCV 90.7 92.0 87.8  --   PLT 277 222 230  --     Basic Metabolic Panel: Recent Labs  Lab 01/01/22 0542 01/01/22 1834 01/02/22 0100 01/04/22 0336 01/04/22 0716  NA 139  143  --  143 137 144  K 3.2*  3.2*  --  3.1* 2.7* 2.8*  CL 113*  118*  --  119* 111 113*  CO2 10*  --  11* 13*  --   GLUCOSE 96  93  --  79 85 71  BUN 68*  66*  --  61* 62* 58*  CREATININE 10.93*  12.50*  --  9.23* 10.39* 11.60*  CALCIUM 5.8*  --  5.4* 6.3*  --   PHOS  --  7.8* 6.9* 7.3*  --     GFR: Estimated Creatinine Clearance: 6.9 mL/min (A) (by C-G formula based on SCr of 11.6 mg/dL (H)). Liver Function Tests: Recent Labs  Lab 01/01/22 0542 01/02/22 0100 01/04/22 0336  AST 18  --   --   ALT 10  --   --  ALKPHOS 74  --   --   BILITOT 0.5  --   --   PROT 8.0  --   --   ALBUMIN 3.4* 2.2* 2.7*    No results for input(s): "LIPASE", "AMYLASE" in the last 168 hours. No results for input(s): "AMMONIA" in the last 168 hours. Coagulation Profile: No results for input(s): "INR", "PROTIME" in the last 168 hours. Cardiac Enzymes: Recent Labs  Lab 01/03/22 0857  CKTOTAL 172    BNP (last 3 results) No results for input(s): "PROBNP" in the last 8760 hours. HbA1C: No results for input(s): "HGBA1C" in the last 72 hours. CBG: Recent Labs  Lab 01/04/22 1142 01/04/22 1220  GLUCAP 76 104*   Lipid Profile: No results for input(s): "CHOL", "HDL", "LDLCALC", "TRIG", "CHOLHDL", "LDLDIRECT" in the last 72 hours. Thyroid Function Tests: No results for input(s): "TSH", "T4TOTAL", "FREET4", "T3FREE", "THYROIDAB" in the last 72 hours. Anemia Panel: No results for input(s): "VITAMINB12", "FOLATE", "FERRITIN", "TIBC", "IRON", "RETICCTPCT" in the last 72 hours.  Sepsis Labs: Recent Labs  Lab 01/01/22 0543  LATICACIDVEN 1.1    Recent Results (from the past 240 hour(s))  Blood culture (routine x 2)     Status: None  (Preliminary result)   Collection Time: 01/01/22  5:44 AM   Specimen: BLOOD  Result Value Ref Range Status   Specimen Description BLOOD BLOOD LEFT WRIST  Final   Special Requests   Final    BOTTLES DRAWN AEROBIC AND ANAEROBIC Blood Culture results may not be optimal due to an inadequate volume of blood received in culture bottles   Culture   Final    NO GROWTH 3 DAYS Performed at [facility], [place] [address] [place], [place] [address]    Report Status PENDING  Incomplete  Blood culture (routine x 2)     Status: None (Preliminary result)   Collection Time: 01/01/22  6:58 AM   Specimen: BLOOD RIGHT FOREARM  Result Value Ref Range Status   Specimen Description BLOOD RIGHT FOREARM  Final   Special Requests   Final    BOTTLES DRAWN AEROBIC AND ANAEROBIC Blood Culture results may not be optimal due to an inadequate volume of blood received in culture bottles   Culture  Setup Time   Final    GRAM NEGATIVE RODS ANAEROBIC BOTTLE ONLY CRITICAL RESULT CALLED TO, READ BACK BY AND VERIFIED WITH: PHARMD J.FRENS AT 1133 ON 01/02/2022 BY T.SAAD.    Culture   Final    GRAM NEGATIVE RODS SENT TO [facility] FOR IDENTIFICATION AND SUSCEPTIBILITY TESTING. CULTURE REINCUBATED FOR BETTER GROWTH Performed at [facility], [place] [address] [place], [place] [address]    Report Status PENDING  Incomplete  Blood Culture ID Panel (Reflexed)     Status: None   Collection Time: 01/01/22  6:58 AM  Result Value Ref Range Status   Enterococcus faecalis NOT DETECTED NOT DETECTED Final   Enterococcus Faecium NOT DETECTED NOT DETECTED Final   Listeria monocytogenes NOT DETECTED NOT DETECTED Final   Staphylococcus species NOT DETECTED NOT DETECTED Final   Staphylococcus aureus (BCID) NOT DETECTED NOT DETECTED Final   Staphylococcus epidermidis NOT DETECTED NOT DETECTED Final   Staphylococcus lugdunensis NOT DETECTED NOT DETECTED Final   Streptococcus species NOT DETECTED NOT DETECTED Final    Streptococcus agalactiae NOT DETECTED NOT DETECTED Final   Streptococcus pneumoniae NOT DETECTED NOT DETECTED Final   Streptococcus pyogenes NOT DETECTED NOT DETECTED Final   A.calcoaceticus-baumannii NOT DETECTED NOT DETECTED Final  Bacteroides fragilis NOT DETECTED NOT DETECTED Final   Enterobacterales NOT DETECTED NOT DETECTED Final   Enterobacter cloacae complex NOT DETECTED NOT DETECTED Final   Escherichia coli NOT DETECTED NOT DETECTED Final   Klebsiella aerogenes NOT DETECTED NOT DETECTED Final   Klebsiella oxytoca NOT DETECTED NOT DETECTED Final   Klebsiella pneumoniae NOT DETECTED NOT DETECTED Final   Proteus species NOT DETECTED NOT DETECTED Final   Salmonella species NOT DETECTED NOT DETECTED Final   Serratia marcescens NOT DETECTED NOT DETECTED Final   Haemophilus influenzae NOT DETECTED NOT DETECTED Final   Neisseria meningitidis NOT DETECTED NOT DETECTED Final   Pseudomonas aeruginosa NOT DETECTED NOT DETECTED Final   Stenotrophomonas maltophilia NOT DETECTED NOT DETECTED Final   Candida albicans NOT DETECTED NOT DETECTED Final   Candida auris NOT DETECTED NOT DETECTED Final   Candida glabrata NOT DETECTED NOT DETECTED Final   Candida krusei NOT DETECTED NOT DETECTED Final   Candida parapsilosis NOT DETECTED NOT DETECTED Final   Candida tropicalis NOT DETECTED NOT DETECTED Final   Cryptococcus neoformans/gattii NOT DETECTED NOT DETECTED Final    Comment: Performed at [facility], [place] [address] [place], [place] [address]  Surgical pcr screen     Status: None   Collection Time: 01/04/22  6:39 AM   Specimen: Nasal Mucosa; Nasal Swab  Result Value Ref Range Status   MRSA, PCR NEGATIVE NEGATIVE Final   Staphylococcus aureus NEGATIVE NEGATIVE Final    Comment: (NOTE) The Xpert SA Assay (FDA approved for NASAL specimens in patients 50 years of age and older), is one component of a comprehensive surveillance program. It is not intended to diagnose infection nor  to guide or monitor treatment. Performed at [facility], [place] [address], [place], [place] [address]      Radiology Studies: DG Chest Port 1 View  Result Date: 01/04/2022 CLINICAL DATA:  End-stage renal disease. Dialysis catheter placement. EXAM: PORTABLE CHEST 1 VIEW COMPARISON:  Chest radiograph dated Jul 14, 2021 FINDINGS: The heart is enlarged. Elevation of the right hemidiaphragm, unchanged. Lungs are otherwise clear without evidence of focal consolidation or pleural effusion. Right IJ double-lumen dialysis catheter with distal tip in the SVC. No acute osseous abnormality. IMPRESSION: 1. Right IJ double-lumen dialysis catheter with distal tip in the SVC. 2. Cardiomegaly. Electronically Signed   By: Keane Police D.O.   On: 01/04/2022 12:03   DG C-Arm 1-60 Min-No Report  Result Date: 01/04/2022 Fluoroscopy was utilized by the requesting physician.  No radiographic interpretation.    Scheduled Meds:  sodium chloride   Intravenous Once   calcitRIOL  1 mcg Oral Daily   calcium carbonate  1,250 mg Oral Q breakfast   Chlorhexidine Gluconate Cloth  6 each Topical Q0600   gentamicin ointment   Topical BID   heparin  5,000 Units Subcutaneous Q8H   nutrition supplement (JUVEN)  1 packet Oral BID BM   saccharomyces boulardii  250 mg Oral BID   sevelamer carbonate  800 mg Oral TID WC   sodium bicarbonate  1,300 mg Oral BID   Continuous Infusions:  ceFEPime (MAXIPIME) IV 1 g (01/04/22 2107)     LOS: 4 days   Time spent: 65mn  Liesl Simons C Konni Kesinger, DO [facility]  If 7PM-7AM, please contact night-coverage [email or web address]  01/05/2022, 7:32 AM

## 2022-01-05 NOTE — Progress Notes (Signed)
Pharmacy Antibiotic Note  [name] is a 56 y.o. female admitted on 12/31/2021 with Cellulitis.  Pharmacy has been consulted for Daptomycin and Cefepime dosing.  Patient with baseline CKD stage V likely advancing to ESRD.   First HD done 11/11.  Plan for next HD on Monday 11/13.    11/10 MRI confirms severe Charcot arthropathy -with notable cellulitis and no definitive abscess or draining tract.  Osteomyelitis less likely.  Daptomycin, Flagyl  were discontinued on 11/10 per discussion with ID and will continue Cefepime until blood cultures result.    -ESR 46, CRP 1.9.  WBC 6.1,  Afebrile -Cultures pending (previous cultures as above showed multiple strep and staph species)  Plan: Continue Cefepime 1g Q24H F/u cultures for therapy de-escalation F/u nephrology plans for dose adjustments  Height: _0  (167.6 cm) Weight: 108.9 kg (240 lb) IBW/kg (Calculated) : 59.3  Temp (24hrs), Avg:97.6 F (36.4 C), Min:97.5 F (36.4 C), Max:97.8 F (36.6 C)  Recent Labs  Lab 01/01/22 0542 01/01/22 0543 01/02/22 0100 01/03/22 0857 01/04/22 0336 01/04/22 0716 01/05/22 0931  WBC 6.9  --  4.9 5.8  --   --  6.1  CREATININE 10.93*  12.50*  --  9.23*  --  10.39* 11.60* 10.08*  LATICACIDVEN  --  1.1  --   --   --   --   --      Estimated Creatinine Clearance: 8 mL/min (A) (by C-G formula based on SCr of 10.08 mg/dL (H)).    Allergies  Allergen Reactions   Penicillins Rash    Antimicrobials this admission: Vanc 11/7  Cefepime 11/7 >> Dapto 11/8 >>11/10 Flagyl 11/8 >>11/10  Microbiology results: 11/7 Bcx: GNR in 1 anaerobic bottle, BCID negative 11/10 MRSA PCR: neg 10/21 Wound: Staph warneri, Strep anginosis, CoN staph    Thank you for allowing pharmacy to be a part of this patient's care.  Nicole Cella, RPh Clinical Pharmacist 01/05/2022 1:34 PM Please check AMION for all [place] phone numbers After 10:00 PM, call [place] [phone]

## 2022-01-06 DIAGNOSIS — Z7984 Long term (current) use of oral hypoglycemic drugs: Secondary | ICD-10-CM

## 2022-01-06 LAB — HEPATIC FUNCTION PANEL
ALT: 8 U/L (ref 0–44)
AST: 13 U/L — ABNORMAL LOW (ref 15–41)
Albumin: 2.5 g/dL — ABNORMAL LOW (ref 3.5–5.0)
Alkaline Phosphatase: 64 U/L (ref 38–126)
Bilirubin, Direct: 0.1 mg/dL (ref 0.0–0.2)
Indirect Bilirubin: 0.1 mg/dL — ABNORMAL LOW (ref 0.3–0.9)
Total Bilirubin: 0.2 mg/dL — ABNORMAL LOW (ref 0.3–1.2)
Total Protein: 6.1 g/dL — ABNORMAL LOW (ref 6.5–8.1)

## 2022-01-06 LAB — CULTURE, BLOOD (ROUTINE X 2): Culture: NO GROWTH

## 2022-01-06 MED ORDER — COLLAGENASE 250 UNIT/GM EX OINT
TOPICAL_OINTMENT | Freq: Every day | CUTANEOUS | Status: DC
  Filled 2022-01-06: qty 30

## 2022-01-06 MED ORDER — METRONIDAZOLE 500 MG PO TABS
500.0000 mg | ORAL_TABLET | Freq: Two times a day (BID) | ORAL | Status: DC
  Administered 2022-01-06 – 2022-01-10 (×9): 500 mg via ORAL
  Filled 2022-01-06 (×9): qty 1

## 2022-01-06 NOTE — Consult Note (Signed)
[facility]  Total days of antibiotics 6/cefepime          Reason for Consult: diabetic foot wound    Referring Physician: regalado  Principal Problem:   Diabetic foot infection ([place]) Active Problems:   Diabetes mellitus with hyperglycemia, without long-term current use of insulin (HCC)   Hypokalemia   HTN (hypertension)   Chronic kidney disease (CKD), stage V (HCC)   Hypocalcemia   Metabolic acidosis, increased anion gap   Diarrhea   Obesity (BMI 30-39.9)   Ulcer of right foot, with necrosis of muscle (HCC)   Charcot's arthropathy    HPI: [name] is a 56 y.o. female with T2DM, CKD 5, and worsening right foot pain,purulent maladorous discharge to plantar ulcer of right foot. She was originally seen in the ED on 10/21 and given course of oral clindamycin. Superficial swab showed Gram positive staph/strep species. Xray did not show signs of osteo but concern charcot's arthropathy. She is readmitted on 11/7 due to wound becoming worse. She was started on broad spectrum abtx and imaging(MRI And ABI) for work up.mri shows local cellulitis and no abscess/or osteo to her foot which is consistent with charcot's foot. She was also seen by renal who felt that she has had progressive renal disease with cr of 9s since August and low bicarb. She realizes that she is now willing to do ESRD. She Had right ij tunneled HD catheter placed on 11/10 by dr Scot Dock. ID asked to weigh in on abtx and management of DFU.  Past Medical History:  Diagnosis Date   Asthma    Child   Diabetes mellitus    Obesity     Allergies:  Allergies  Allergen Reactions   Penicillins Rash     MEDICATIONS:  sodium chloride   Intravenous Once   calcitRIOL  1 mcg Oral Daily   Chlorhexidine Gluconate Cloth  6 each Topical Q0600   gentamicin ointment   Topical BID   heparin  5,000 Units Subcutaneous Q8H   nutrition supplement (JUVEN)  1 packet Oral BID BM   saccharomyces boulardii   250 mg Oral BID   sevelamer carbonate  800 mg Oral TID WC   sodium bicarbonate  1,300 mg Oral BID    Social History   Tobacco Use   Smoking status: Former    Packs/day: 0.00    Types: Cigarettes   Smokeless tobacco: Never   Tobacco comments:    Smoked when she was a teenager.   Vaping Use   Vaping Use: Never used  Substance Use Topics   Alcohol use: Yes    Comment: occasionally   Drug use: No    Family History  Problem Relation Age of Onset   Diabetes Mother      Review of Systems  Constitutional: Negative for fever, chills, diaphoresis, activity change, appetite change, fatigue and unexpected weight change.  HENT: Negative for congestion, sore throat, rhinorrhea, sneezing, trouble swallowing and sinus pressure.  Eyes: Negative for photophobia and visual disturbance.  Respiratory: Negative for cough, chest tightness, shortness of breath, wheezing and stridor.  Cardiovascular: Negative for chest pain, palpitations and leg swelling.  Gastrointestinal: Negative for nausea, vomiting, abdominal pain, diarrhea, constipation, blood in stool, abdominal distention and anal bleeding.  Genitourinary: Negative for dysuria, hematuria, flank pain and difficulty urinating.  Musculoskeletal: Negative for myalgias, back pain, joint swelling, arthralgias and gait problem.  Skin: +right foot wound. Negative for color change, pallor, rash and wound.  Neurological:  Negative for dizziness, tremors, weakness and light-headedness.  Hematological: Negative for adenopathy. Does not bruise/bleed easily.  Psychiatric/Behavioral: Negative for behavioral problems, confusion, sleep disturbance, dysphoric mood, decreased concentration and agitation.    OBJECTIVE: Temp:  [97.6 F (36.4 C)-98.8 F (37.1 C)] 97.8 F (36.6 C) (11/12 1058) Pulse Rate:  [74-89] 86 (11/12 1058) Resp:  [15-18] 18 (11/12 1058) BP: (120-165)/(68-88) 146/78 (11/12 1058) SpO2:  [98 %-100 %] 100 % (11/12 1058) Physical Exam   Constitutional:  oriented to person, place, and time. appears well-developed and well-nourished. No distress.  HENT: [place]/AT, PERRLA, no scleral icterus Mouth/Throat: Oropharynx is clear and moist. No oropharyngeal exudate.  Cardiovascular: Normal rate, regular rhythm and normal heart sounds. Exam reveals no gallop and no friction rub.  No murmur heard.  Pulmonary/Chest: Effort normal and breath sounds normal. No respiratory distress.  has no wheezes.  Neck = supple, no nuchal rigidity Abdominal: Soft. Bowel sounds are normal.  exhibits no distension. There is no tenderness.  Lymphadenopathy: no cervical adenopathy. No axillary adenopathy Neurological: alert and oriented to person, place, and time.  Skin: +maladorous plantar wound with greyish exudate in wound bed of right foot. Some associated deformity of charcot foot/loss of arch Psychiatric: a normal mood and affect.  behavior is normal.    LABS: Results for orders placed or performed during the hospital encounter of 12/31/21 (from the past 48 hour(s))  Glucose, capillary     Status: None   Collection Time: 01/04/22 11:42 AM  Result Value Ref Range   Glucose-Capillary 76 70 - 99 mg/dL    Comment: Glucose reference range applies only to samples taken after fasting for at least 8 hours.   Comment 1 Notify RN   Glucose, capillary     Status: Abnormal   Collection Time: 01/04/22 12:20 PM  Result Value Ref Range   Glucose-Capillary 104 (H) 70 - 99 mg/dL    Comment: Glucose reference range applies only to samples taken after fasting for at least 8 hours.  Renal function panel     Status: Abnormal   Collection Time: 01/05/22  9:31 AM  Result Value Ref Range   Sodium 138 135 - 145 mmol/L   Potassium 3.4 (L) 3.5 - 5.1 mmol/L   Chloride 110 98 - 111 mmol/L   CO2 14 (L) 22 - 32 mmol/L   Glucose, Bld 116 (H) 70 - 99 mg/dL    Comment: Glucose reference range applies only to samples taken after fasting for at least 8 hours.   BUN 66 (H) 6 -  20 mg/dL   Creatinine, Ser 10.08 (H) 0.44 - 1.00 mg/dL   Calcium 6.6 (L) 8.9 - 10.3 mg/dL   Phosphorus 6.9 (H) 2.5 - 4.6 mg/dL   Albumin 2.8 (L) 3.5 - 5.0 g/dL   GFR, Estimated 4 (L) >60 mL/min    Comment: (NOTE) Calculated using the CKD-EPI Creatinine Equation (2021)    Anion gap 14 5 - 15    Comment: Performed at [facility] [address] [place], [place] [address]  CBC     Status: Abnormal   Collection Time: 01/05/22  9:31 AM  Result Value Ref Range   WBC 6.1 4.0 - 10.5 K/uL   RBC 3.09 (L) 3.87 - 5.11 MIL/uL   Hemoglobin 8.8 (L) 12.0 - 15.0 g/dL   HCT 27.5 (L) 36.0 - 46.0 %   MCV 89.0 80.0 - 100.0 fL   MCH 28.5 26.0 - 34.0 pg   MCHC 32.0 30.0 -  36.0 g/dL   RDW 14.9 11.5 - 15.5 %   Platelets 262 150 - 400 K/uL   nRBC 0.3 (H) 0.0 - 0.2 %    Comment: Performed at [facility] [address], [place], [place] [address]  Hepatic function panel     Status: Abnormal   Collection Time: 01/06/22 10:06 AM  Result Value Ref Range   Total Protein 6.1 (L) 6.5 - 8.1 g/dL   Albumin 2.5 (L) 3.5 - 5.0 g/dL   AST 13 (L) 15 - 41 U/L   ALT 8 0 - 44 U/L   Alkaline Phosphatase 64 38 - 126 U/L   Total Bilirubin 0.2 (L) 0.3 - 1.2 mg/dL   Bilirubin, Direct 0.1 0.0 - 0.2 mg/dL   Indirect Bilirubin 0.1 (L) 0.3 - 0.9 mg/dL    Comment: Performed at [facility] [address] [place], [place] [address]    MICRO: Blood cx ngtd MRSA screen negative  IMAGING: DG Chest Port 1 View  Result Date: 01/04/2022 CLINICAL DATA:  End-stage renal disease. Dialysis catheter placement. EXAM: PORTABLE CHEST 1 VIEW COMPARISON:  Chest radiograph dated Jul 14, 2021 FINDINGS: The heart is enlarged. Elevation of the right hemidiaphragm, unchanged. Lungs are otherwise clear without evidence of focal consolidation or pleural effusion. Right IJ double-lumen dialysis catheter with distal tip in the SVC. No acute osseous abnormality. IMPRESSION: 1. Right IJ double-lumen dialysis catheter with  distal tip in the SVC. 2. Cardiomegaly. Electronically Signed   By: Keane Police D.O.   On: 01/04/2022 12:03   DG C-Arm 1-60 Min-No Report  Result Date: 01/04/2022 Fluoroscopy was utilized by the requesting physician.  No radiographic interpretation.     Assessment/Plan:  56yo F with T2DM with right foot diabetic ulcer  limited to cellulitis and ulcer and worsening ckd requiring ESRD now with HD catheter  - recommend 2 wk of cefepime with HD, plus oral metronidazole 500mg  po bid - recommend use of daily santyl or medihoney application to wound to debride the wound - have wound care rn to give recs and education to patient - follow up with podiatry  T2DM = appears under goo control with hemoglobin A1c. Continue with current regimen  PAD = will need to either do further work up per vascular surgery during this hospitalization to see if needs revascularization intervention.  Will sign off  Anayelli Lai B. [facility] Infect[facility] [phone]

## 2022-01-06 NOTE — Progress Notes (Signed)
[facility] Progress Note   56 y.o. female DM, obesity, HTN, CKD unknown stage, presenting with a wound in her right foot. She actually was trying to shave a callous a few weeks ago and ended up cutting herself seen in the ED recently and sent home with Clindamycin. Wound cultures grew out Staph warnerii, CNS, and Strep anginosis; unfortunately the wound did not improve and she came back to the ED with foul smelling drainage. Xrays showed chronic Charcot foot and the patient was started on Vancomycin and Cefepime. Creatinine was noted to be 12.5 initially and has been elevated for over 2 mths; Creatinine was 9.26 10/22/2021 and prior to that the last labs were on 03/11/2018 w/ a creatinine of 1.31.     Assessment/ Plan:   Renal failure - suspicious for chronic progression with a creatinine that was already 9.26 on 10/22/2021. She stated that her PMD is Dr. [name] [phone]) but they do not have any labs outside of what is available on Epic. It appears she was lost to f/u for many years and has had progression of her kidney disease. Really no need to repeat the renal ultrasound as she just had one on 12/27/2021 which showed low volume kidneys, hyperechogenic. Fortunately she doesn't have any absolute indications for dialysis. She was resistant to having a TC to initiate dialysis and kept stating that she wants home dialysis. I think someone (likely primary) has told her about nearing dialysis as she has been looking on Youtube about ESRD and dialysis access. Non active sediment in urine. UPC 4.5 (elevated with ESRD) - Stopped Lisinopril/HCTZ, Lasix as well -> . Can restart Lisinopril if hypertensive (BP 120/68 this am) now that she's on dialysis if needed and Lasix if she still voids/responds to help w/ interdialytic volume management. - Appreciate the RIJ TC by Dr. Scot Dock on 11/10. Continue HCO3 2 tabs BID for now until HCO3 is stabilized. - Will continue dialysis education;  permanent access eventually likely as outpt.  - Refused dialysis on Friday; tolerated a full treatment on 11/11 with 2L net UF; next HD #2  on Monday. She prefers eventually TTS but understands at least for next week MTTS given all the volume she has onboard.  - Obtained vein mapping for her but per Dr Scot Dock permanent access only after her PAD + foot infection is treated adequately; she is left hand dominant.  HTN  - Norvasc 5mg  daily for now; UF with RRT will help as well Renal osteodystrophy w/ vit d deficiency - Phos 6.9 (started renvela), 25VitD 11.7 (started calcitriol 91mcg daily) and iPTH 281 (on calcitriol); Foot wound - per primary PAD - per Dr. Scot Dock will need a lower extremity study with contrast; will need to find out on Monday whether this is to be done inpt or outpt.  Anemia - 1 U PRBC 11/8; TSAT 32% F only 87 but with infection can't give IV iron. CRP 1.9 DM  Subjective:   Awake this am and cooperative. Denies f/c/n/v. Feeling better and denies SOB.   Objective:   BP 120/68 (BP Location: Right Arm)   Pulse 78   Temp 98.3 F (36.8 C) (Oral)   Resp 16   Ht 5\' 6"  (1.676 m)   Wt 108.9 kg   LMP 05/26/2017   SpO2 100%   BMI 38.74 kg/m   Intake/Output Summary (Last 24 hours) at 01/06/2022 1058 Last data filed at 01/06/2022 0600 Gross per 24 hour  Intake 200 ml  Output 2000  ml  Net -1800 ml   Weight change:   Physical Exam: General appearance: NCAT Eyes: conj pallor Neck: no adenopathy, no JVD Back: No CVA tenderness. Resp: clear to auscultation bilaterally Cardio: regular rate and rhythm GI: SNDNT + BS Extremities: edema woody; wound in bottom of right foot Access: RIJ TC  Imaging: DG Chest Port 1 View  Result Date: 01/04/2022 CLINICAL DATA:  End-stage renal disease. Dialysis catheter placement. EXAM: PORTABLE CHEST 1 VIEW COMPARISON:  Chest radiograph dated Jul 14, 2021 FINDINGS: The heart is enlarged. Elevation of the right hemidiaphragm, unchanged.  Lungs are otherwise clear without evidence of focal consolidation or pleural effusion. Right IJ double-lumen dialysis catheter with distal tip in the SVC. No acute osseous abnormality. IMPRESSION: 1. Right IJ double-lumen dialysis catheter with distal tip in the SVC. 2. Cardiomegaly. Electronically Signed   By: Keane Police D.O.   On: 01/04/2022 12:03   DG C-Arm 1-60 Min-No Report  Result Date: 01/04/2022 Fluoroscopy was utilized by the requesting physician.  No radiographic interpretation.    Labs: BMET Recent Labs  Lab 01/01/22 0542 01/01/22 1834 01/02/22 0100 01/04/22 0336 01/04/22 0716 01/05/22 0931  NA 139  143  --  143 137 144 138  K 3.2*  3.2*  --  3.1* 2.7* 2.8* 3.4*  CL 113*  118*  --  119* 111 113* 110  CO2 10*  --  11* 13*  --  14*  GLUCOSE 96  93  --  79 85 71 116*  BUN 68*  66*  --  61* 62* 58* 66*  CREATININE 10.93*  12.50*  --  9.23* 10.39* 11.60* 10.08*  CALCIUM 5.8*  --  5.4* 6.3*  --  6.6*  PHOS  --  7.8* 6.9* 7.3*  --  6.9*   CBC Recent Labs  Lab 01/01/22 0542 01/02/22 0100 01/03/22 0857 01/04/22 0716 01/05/22 0931  WBC 6.9 4.9 5.8  --  6.1  NEUTROABS 4.5  --   --   --   --   HGB 9.3*  10.5* 6.8* 9.6* 10.5* 8.8*  HCT 29.1*  31.0* 21.8* 28.7* 31.0* 27.5*  MCV 90.7 92.0 87.8  --  89.0  PLT 277 222 230  --  262    Medications:     sodium chloride   Intravenous Once   calcitRIOL  1 mcg Oral Daily   Chlorhexidine Gluconate Cloth  6 each Topical Q0600   gentamicin ointment   Topical BID   heparin  5,000 Units Subcutaneous Q8H   nutrition supplement (JUVEN)  1 packet Oral BID BM   saccharomyces boulardii  250 mg Oral BID   sevelamer carbonate  800 mg Oral TID WC   sodium bicarbonate  1,300 mg Oral BID      Otelia Santee, MD 01/06/2022, 10:58 AM

## 2022-01-06 NOTE — Progress Notes (Signed)
Patient refused lab draw, stated, "only the IV team can stick me for anything". IV team consulted for blood draw; however, IV team stated they are unable to stick patient for lab draws.

## 2022-01-06 NOTE — Progress Notes (Signed)
PROGRESS NOTE    [name]  OEH:[id number] DOB: Feb 25, 1966 DOA: 12/31/2021 PCP: [name], MD   Brief Narrative:  [name] is a 56 y.o. female with medical history significant of diabetes mellitus type 2, CKD stage V, and obesity who presents for worsening pain and a wound with foul smell of her right foot.    She has been seen in in the emergency department on 10/21 for the same and given imaging/findings at that time was appropriately sent home with clindamycin which she reports she completed. Wound cultures at the time noted Staphylococcus warneri, Streptococcus anginosis, and Staphylococcus species. X-rays of the right foot at intake noted to the plantar aspect of the foot with signs of bone involvement for which findings were consistent with chronic Charcot foot. She had MRI negative for osteo.  Podiatry evaluated patient and recommended wound care and IV antibiotics and follow-up as an outpatient.  Patient was also evaluated by vascular, will think arterial Doppler study showed biphasic Doppler signal in the posterior tibial and dorsalis pedis position.  Her circulation is likely adequate.  They would like to do arteriogram however patient has just started hemodialysis and would like to avoid IV contrast.  Unlikely CO2 administration will give adequate visualization of her tibial vessels.   Patient also presented with worsening CKD stage V now progressed to ESRD.  Patient was a started on hemodialysis.  Patient had insertion of tunneled dialysis catheter placed on 11/10.  He was also found to have 1 out of 2 blood cultures positive for gram-negative rods.   Assessment & Plan:   Principal Problem:   Diabetic foot infection ([place]) Active Problems:   Hypocalcemia   Hypokalemia   Chronic kidney disease (CKD), stage V (HCC)   Metabolic acidosis, increased anion gap   Diarrhea   HTN (hypertension)   Diabetes mellitus with hyperglycemia, without long-term current use of  insulin (HCC)   Obesity (BMI 30-39.9)   Ulcer of right foot, with necrosis of muscle ([place])   Charcot's arthropathy  Diabetic foot wound infection, failure of outpatient antibiotics Rule out bacteremia Patient does not meet sepsis criteria -Previously on clindamycin, reports completion antibiotics.  -Podiatry signed off -no indication for surgery based on imaging. -MRI confirms severe Charcot arthropathy -with notable cellulitis and no definitive abscess or draining tract. Osteomyelitis is less likely. -Continue cefepime - for gram negative rods blood culture 11/07 -ESR 46, CRP 1.9 -Cultures pending  -Previous surgical cultures as above showed multiple strep and staph species -Blood culture show gram negative cocci preliminarily -ID consulted for antibiotics recommendations.    CKD stage V - acutely worsening, progressing to ESRD Metabolic acidosis.  -Patient initially refused HD/access but now agreeable 11/9 -Vascular to place [facility] 11/10 -HD 11/10 and 11/11 - tolerating well -Nephro following -appreciate insight recommendations -needs clip.  Had first HD 11/11. Labs pending for today.   Acute on chronic anemia likely of chronic disease -Likely secondary to acute illness and infection as above -Exacerbated by CKD stage V -monitor hb.   Hypocalcemia -Status post IV calcium gluconate, continue p.o. calcium -Continue to monitor and replace as needed -check albumin.   Hypokalemia -Replete with HD -labs pending for today.   Metabolic acidosis with elevated anion gap -Secondary to uremia. -Likely to improve with dialysis   Diarrhea -Improving, likely secondary to previous prolonged antibiotic course    Noncompliance  -Patient occasionally refusing labs, intervention, medical care making treatment difficult.  - She agreed to HD/access.  Essential hypertension -Blood  pressures likely to improve with dialysis -Held furosemide and lisinopril-hydrochlorothiazide due to  kidney function -Hydralazine IV as needed   Controlled diabetes mellitus type 2, without long-term use of insulin -Hemoglobin A1c was 6 on 12/21/2021 -Hypoglycemic protocol -Hold glipizide and metformin -Renal and carb modified diet -Consider placing on sensitive SSI if blood sugars noted to increase. -cbg 107  Obesity BMI 38.74 kg/m  DVT prophylaxis: Heparin Code Status: Full Family Communication: None present  Status is: Inpatient  Dispo: The patient is from: Home              Anticipated d/c is to: Home              Anticipated d/c date is: Pending above              Patient currently not medically stable for discharge  Consultants:  Podiatry, nephrology, vascular surgery  Procedures:  Pending above  Antimicrobials:  Cefepime, daptomycin, Flagyl  Subjective: She is sleepy. Would agree to get labs drawn later. No new complaints.   Objective: Vitals:   01/05/22 1243 01/05/22 1319 01/05/22 1932 01/06/22 0540  BP:  (!) 161/80 136/68 120/68  Pulse:  84 89 78  Resp:  _0 Temp: 97.6 F (36.4 C) 97.8 F (36.6 C) 98.8 F (37.1 C) 98.3 F (36.8 C)  TempSrc: Oral Oral Oral Oral  SpO2:  100% 98% 100%  Weight:      Height:        Intake/Output Summary (Last 24 hours) at 01/06/2022 0654 Last data filed at 01/06/2022 0600 Gross per 24 hour  Intake 200 ml  Output 2000 ml  Net -1800 ml    Filed Weights   01/01/22 0542  Weight: 108.9 kg    Examination:  General:  NAD Lungs: CTA Heart: S 1, S 2 RRR Abdomen: BS present, soft, nt Extremities: Right foot wound, POA   Data Reviewed: I have personally reviewed following labs and imaging studies  CBC: Recent Labs  Lab 01/01/22 0542 01/02/22 0100 01/03/22 0857 01/04/22 0716 01/05/22 0931  WBC 6.9 4.9 5.8  --  6.1  NEUTROABS 4.5  --   --   --   --   HGB 9.3*  10.5* 6.8* 9.6* 10.5* 8.8*  HCT 29.1*  31.0* 21.8* 28.7* 31.0* 27.5*  MCV 90.7 92.0 87.8  --  89.0  PLT 277 222 230  --  262     Basic Metabolic Panel: Recent Labs  Lab 01/01/22 0542 01/01/22 1834 01/02/22 0100 01/04/22 0336 01/04/22 0716 01/05/22 0931  NA 139  143  --  143 137 144 138  K 3.2*  3.2*  --  3.1* 2.7* 2.8* 3.4*  CL 113*  118*  --  119* 111 113* 110  CO2 10*  --  11* 13*  --  14*  GLUCOSE 96  93  --  79 85 71 116*  BUN 68*  66*  --  61* 62* 58* 66*  CREATININE 10.93*  12.50*  --  9.23* 10.39* 11.60* 10.08*  CALCIUM 5.8*  --  5.4* 6.3*  --  6.6*  PHOS  --  7.8* 6.9* 7.3*  --  6.9*    GFR: Estimated Creatinine Clearance: 8 mL/min (A) (by C-G formula based on SCr of 10.08 mg/dL (H)). Liver Function Tests: Recent Labs  Lab 01/01/22 0542 01/02/22 0100 01/04/22 0336 01/05/22 0931  AST 18  --   --   --   ALT 10  --   --   --  ALKPHOS 74  --   --   --   BILITOT 0.5  --   --   --   PROT 8.0  --   --   --   ALBUMIN 3.4* 2.2* 2.7* 2.8*    No results for input(s): "LIPASE", "AMYLASE" in the last 168 hours. No results for input(s): "AMMONIA" in the last 168 hours. Coagulation Profile: No results for input(s): "INR", "PROTIME" in the last 168 hours. Cardiac Enzymes: Recent Labs  Lab 01/03/22 0857  CKTOTAL 172    BNP (last 3 results) No results for input(s): "PROBNP" in the last 8760 hours. HbA1C: No results for input(s): "HGBA1C" in the last 72 hours. CBG: Recent Labs  Lab 01/04/22 1142 01/04/22 1220  GLUCAP 76 104*    Lipid Profile: No results for input(s): "CHOL", "HDL", "LDLCALC", "TRIG", "CHOLHDL", "LDLDIRECT" in the last 72 hours. Thyroid Function Tests: No results for input(s): "TSH", "T4TOTAL", "FREET4", "T3FREE", "THYROIDAB" in the last 72 hours. Anemia Panel: No results for input(s): "VITAMINB12", "FOLATE", "FERRITIN", "TIBC", "IRON", "RETICCTPCT" in the last 72 hours.  Sepsis Labs: Recent Labs  Lab 01/01/22 0543  LATICACIDVEN 1.1    Recent Results (from the past 240 hour(s))  Blood culture (routine x 2)     Status: None (Preliminary result)    Collection Time: 01/01/22  5:44 AM   Specimen: BLOOD  Result Value Ref Range Status   Specimen Description BLOOD BLOOD LEFT WRIST  Final   Special Requests   Final    BOTTLES DRAWN AEROBIC AND ANAEROBIC Blood Culture results may not be optimal due to an inadequate volume of blood received in culture bottles   Culture   Final    NO GROWTH 4 DAYS Performed at [facility], [place] [address], [place], [place] [address]    Report Status PENDING  Incomplete  Blood culture (routine x 2)     Status: None (Preliminary result)   Collection Time: 01/01/22  6:58 AM   Specimen: BLOOD RIGHT FOREARM  Result Value Ref Range Status   Specimen Description BLOOD RIGHT FOREARM  Final   Special Requests   Final    BOTTLES DRAWN AEROBIC AND ANAEROBIC Blood Culture results may not be optimal due to an inadequate volume of blood received in culture bottles   Culture  Setup Time   Final    GRAM NEGATIVE RODS ANAEROBIC BOTTLE ONLY CRITICAL RESULT CALLED TO, READ BACK BY AND VERIFIED WITH: PHARMD J.FRENS AT 1133 ON 01/02/2022 BY T.SAAD.    Culture   Final    GRAM NEGATIVE RODS SENT TO [facility] FOR IDENTIFICATION AND SUSCEPTIBILITY TESTING. Performed at [facility], [place] [address] [place], [place] [address]    Report Status PENDING  Incomplete  Blood Culture ID Panel (Reflexed)     Status: None   Collection Time: 01/01/22  6:58 AM  Result Value Ref Range Status   Enterococcus faecalis NOT DETECTED NOT DETECTED Final   Enterococcus Faecium NOT DETECTED NOT DETECTED Final   Listeria monocytogenes NOT DETECTED NOT DETECTED Final   Staphylococcus species NOT DETECTED NOT DETECTED Final   Staphylococcus aureus (BCID) NOT DETECTED NOT DETECTED Final   Staphylococcus epidermidis NOT DETECTED NOT DETECTED Final   Staphylococcus lugdunensis NOT DETECTED NOT DETECTED Final   Streptococcus species NOT DETECTED NOT DETECTED Final   Streptococcus agalactiae NOT DETECTED NOT DETECTED Final   Streptococcus  pneumoniae NOT DETECTED NOT DETECTED Final   Streptococcus pyogenes NOT DETECTED NOT DETECTED Final   A.calcoaceticus-baumannii  NOT DETECTED NOT DETECTED Final   Bacteroides fragilis NOT DETECTED NOT DETECTED Final   Enterobacterales NOT DETECTED NOT DETECTED Final   Enterobacter cloacae complex NOT DETECTED NOT DETECTED Final   Escherichia coli NOT DETECTED NOT DETECTED Final   Klebsiella aerogenes NOT DETECTED NOT DETECTED Final   Klebsiella oxytoca NOT DETECTED NOT DETECTED Final   Klebsiella pneumoniae NOT DETECTED NOT DETECTED Final   Proteus species NOT DETECTED NOT DETECTED Final   Salmonella species NOT DETECTED NOT DETECTED Final   Serratia marcescens NOT DETECTED NOT DETECTED Final   Haemophilus influenzae NOT DETECTED NOT DETECTED Final   Neisseria meningitidis NOT DETECTED NOT DETECTED Final   Pseudomonas aeruginosa NOT DETECTED NOT DETECTED Final   Stenotrophomonas maltophilia NOT DETECTED NOT DETECTED Final   Candida albicans NOT DETECTED NOT DETECTED Final   Candida auris NOT DETECTED NOT DETECTED Final   Candida glabrata NOT DETECTED NOT DETECTED Final   Candida krusei NOT DETECTED NOT DETECTED Final   Candida parapsilosis NOT DETECTED NOT DETECTED Final   Candida tropicalis NOT DETECTED NOT DETECTED Final   Cryptococcus neoformans/gattii NOT DETECTED NOT DETECTED Final    Comment: Performed at [facility], [place] [address], [place], [place] [address]  Surgical pcr screen     Status: None   Collection Time: 01/04/22  6:39 AM   Specimen: Nasal Mucosa; Nasal Swab  Result Value Ref Range Status   MRSA, PCR NEGATIVE NEGATIVE Final   Staphylococcus aureus NEGATIVE NEGATIVE Final    Comment: (NOTE) The Xpert SA Assay (FDA approved for NASAL specimens in patients 16 years of age and older), is one component of a comprehensive surveillance program. It is not intended to diagnose infection nor to guide or monitor treatment. Performed at [facility], [place]  [address], [place],  [address]      Radiology Studies: DG Chest Port 1 View  Result Date: 01/04/2022 CLINICAL DATA:  End-stage renal disease. Dialysis catheter placement. EXAM: PORTABLE CHEST 1 VIEW COMPARISON:  Chest radiograph dated Jul 14, 2021 FINDINGS: The heart is enlarged. Elevation of the right hemidiaphragm, unchanged. Lungs are otherwise clear without evidence of focal consolidation or pleural effusion. Right IJ double-lumen dialysis catheter with distal tip in the SVC. No acute osseous abnormality. IMPRESSION: 1. Right IJ double-lumen dialysis catheter with distal tip in the SVC. 2. Cardiomegaly. Electronically Signed   By: Keane Police D.O.   On: 01/04/2022 12:03   DG C-Arm 1-60 Min-No Report  Result Date: 01/04/2022 Fluoroscopy was utilized by the requesting physician.  No radiographic interpretation.    Scheduled Meds:  sodium chloride   Intravenous Once   calcitRIOL  1 mcg Oral Daily   Chlorhexidine Gluconate Cloth  6 each Topical Q0600   gentamicin ointment   Topical BID   heparin  5,000 Units Subcutaneous Q8H   nutrition supplement (JUVEN)  1 packet Oral BID BM   saccharomyces boulardii  250 mg Oral BID   sevelamer carbonate  800 mg Oral TID WC   sodium bicarbonate  1,300 mg Oral BID   Continuous Infusions:  ceFEPime (MAXIPIME) IV 1 g (01/05/22 1728)     LOS: 5 days   Time spent: 88mn  Geneen Dieter A Jahniya Duzan, MD [facility]  If 7PM-7AM, please contact night-coverage [email or web address]  01/06/2022, 6:54 AM

## 2022-01-06 NOTE — Consult Note (Signed)
[facility] Nurse Consult Note: Reason for Consult:Right foot with lateral and plantar heel wounds, Charcot deformity. Patient seen by Podiatric Medicine (Dr. Posey Pronto) on 01/02/22.  Please see note from that encounter. Wound type: neuropathic, infectious Pressure Injury POA: N/A Dressing procedure/placement/frequency:Dr. Posey Pronto recommended betadine dressings to the wounds of the right foot and indicated that he would reconsult as needed. The recommendations were not translated into Nursing Orders.  I have done that this afternoon and dressing changes are to begin today. Additionally, I have added house PI prevention measures (sacral foam dressing, floatation of heels via Prevalon boots) to the POC. Note: Dr. Retia Passe had requested that [facility] Nursing obtain a deep tissue culture of the wounds. I have communicated with Dr. Baxter Flattery via [place] to let her know that was outside the scope of [place] and that while the Bedside RN could obtain a swab culture for her, a provider would be needed to obtain a deep tissue culture.  [place] nursing team will not follow, but will remain available to this patient, the nursing and medical teams.  Please re-consult if needed.  Thank you for inviting [place] to participate in this patient's Plan of Care.  Maudie Flakes, MSN, RN, CNS, [place], Serita Grammes, [employer], [employer] phone:  [phone]

## 2022-01-07 LAB — CBC
HCT: 25.3 % — ABNORMAL LOW (ref 36.0–46.0)
Hemoglobin: 8.4 g/dL — ABNORMAL LOW (ref 12.0–15.0)
MCH: 29.5 pg (ref 26.0–34.0)
MCHC: 33.2 g/dL (ref 30.0–36.0)
MCV: 88.8 fL (ref 80.0–100.0)
Platelets: 222 10*3/uL (ref 150–400)
RBC: 2.85 MIL/uL — ABNORMAL LOW (ref 3.87–5.11)
RDW: 14.8 % (ref 11.5–15.5)
WBC: 6.4 10*3/uL (ref 4.0–10.5)
nRBC: 0 % (ref 0.0–0.2)

## 2022-01-07 LAB — RENAL FUNCTION PANEL
Albumin: 2.5 g/dL — ABNORMAL LOW (ref 3.5–5.0)
Anion gap: 15 (ref 5–15)
BUN: 50 mg/dL — ABNORMAL HIGH (ref 6–20)
CO2: 22 mmol/L (ref 22–32)
Calcium: 6.9 mg/dL — ABNORMAL LOW (ref 8.9–10.3)
Chloride: 104 mmol/L (ref 98–111)
Creatinine, Ser: 7.19 mg/dL — ABNORMAL HIGH (ref 0.44–1.00)
GFR, Estimated: 6 mL/min — ABNORMAL LOW (ref >60)
Glucose, Bld: 114 mg/dL — ABNORMAL HIGH (ref 70–99)
Phosphorus: 4 mg/dL (ref 2.5–4.6)
Potassium: 2.9 mmol/L — ABNORMAL LOW (ref 3.5–5.1)
Sodium: 141 mmol/L (ref 135–145)

## 2022-01-07 LAB — GLUCOSE, CAPILLARY
Glucose-Capillary: 83 mg/dL (ref 70–99)
Glucose-Capillary: 101 mg/dL — ABNORMAL HIGH (ref 70–99)

## 2022-01-07 MED ORDER — AMLODIPINE BESYLATE 10 MG PO TABS: 10.0000 mg | ORAL_TABLET | Freq: Every day | ORAL | Status: DC

## 2022-01-07 MED ORDER — CHLORHEXIDINE GLUCONATE CLOTH 2 % EX PADS
6.0000 | MEDICATED_PAD | Freq: Every day | CUTANEOUS | Status: DC
  Administered 2022-01-07 – 2022-01-08 (×2): 6 via TOPICAL

## 2022-01-07 MED ORDER — HEPARIN SODIUM (PORCINE) 1000 UNIT/ML IJ SOLN
INTRAMUSCULAR | Status: AC
  Filled 2022-01-07: qty 4

## 2022-01-07 MED ORDER — HEPARIN SODIUM (PORCINE) 1000 UNIT/ML IJ SOLN
INTRAMUSCULAR | Status: AC
  Filled 2022-01-07: qty 1

## 2022-01-07 MED ORDER — DARBEPOETIN ALFA 60 MCG/0.3ML IJ SOSY
60.0000 ug | PREFILLED_SYRINGE | INTRAMUSCULAR | Status: DC
  Administered 2022-01-08: 60 ug via SUBCUTANEOUS
  Filled 2022-01-07: qty 0.3

## 2022-01-07 NOTE — Assessment & Plan Note (Addendum)
Progressive CKD now ESRD. Hypocalcemia.  Hypokalemia. Metabolic anion gap acidosis.  TDC placed on 11/10 First renal replacement therapy started on 11/11.  Plan to continue with HD per nephrology recommendations. Will need outpatient HD unit.   Anemia of chronic kidney disease, Hgb has been stable, continue follow up on cell counts.

## 2022-01-07 NOTE — Progress Notes (Signed)
Progress Note   Patient: [name] XNA:[id number] DOB: 1965-12-29 DOA: 12/31/2021     6 DOS: the patient was seen and examined on 01/07/2022   Brief hospital course: [name] was admitted to the hospital with the working diagnosis of worsening diabetic foot wound infection.   56 yo female with the past medical history of T2DM, CKD stage V, and obesity who presented with worsening right foot wound. Patient was diagnosed with a right foot diabetic wound infection, on 12/15/21, she was prescribed oral clindamycin. Wound culture positive for staphylococcus warneri, streptococcus angionsis, and staphylococcus species. Unfortunately her wound continue to worsened, with foul smelling drainage. In the ED her blood pressure was 159/92, HR 78, RR 15 and 02 saturation 100%, her lungs had no wheezing or rhonchi, heart with S1 and S2 present and rhythmic with no gallops, abdomen with no distention, positive bilateral lower extremity, open wound right foot, on the plantar aspect of the mid foot with foul smelling drainage present.   Na 139, K 3,2 Cl 113, bicarbonate 10 glucose 96 bun 68 cr 10.9  Wbc 6,9 hgb 9,3 plt 277   Right foot radiograph with plantar wound at the level of the midfoot, no deep soft tissue gas extension. No radiographic findings of osteomyelitis  Findings consistent with chronic Charcot foot.   EKG 80 bpm, normal axis, qtc 513, sinus rhythm with no significant ST segment or T wave changes.   Further work up with foot MRI with no signs of osteomyelitis.  Podiatry evaluated the patient and recommended IV antibiotic therapy, wound care and close follow up as outpatient.  Vascular surgery recommended arteriogram, but she declined procedure during this hospitalization.   For her progressive renal disease, patient had a HD cathter placed and started on renal replacement therapy on 11/10.  Will need outpatient HD unit and close follow up with podiatry.     Assessment and Plan: *  Diabetic foot infection ([place]) No clinical signs of osteomyelitis.  Continue local wound care and as needed analgesics. Vascular US with no significant circulation compromise but ideally she will need aortogram to rule out any proximal stenosis (infra inguinal).   Plan to continue antibiotic therapy with Cefepime on HD for 2 weeks, plus oral metronidazole bid.  Local wound care and follow up with podiatry.   Chronic kidney disease (CKD), stage V (HCC) Progressive CKD now ESRD. Hypocalcemia.  Hypokalemia. Metabolic anion gap acidosis.  TDC placed on 11/10 First renal replacement therapy started on 11/11.  Plan to continue with HD per nephrology recommendations. Will need outpatient HD unit.   Anemia of chronic kidney disease, Hgb has been stable, continue follow up on cell counts.   HTN (hypertension) Continue blood pressure monitoring. At home patient on HCTZ and lisinopril combination. Today her blood pressure is 140 to 160 mmHg.  Will add amlodipine,   Diabetes mellitus with hyperglycemia, without long-term current use of insulin (HCC) Fasting glucose is 114. Continue with insulin sliding scale for glucose cover and monitoring   Obesity (BMI 30-39.9) Calculated BMI 36,6 consistent with obesity class 2.         Subjective: Patient with no chest pain, or dyspnea, examined on HD   Physical Exam: Vitals:   01/07/22 1230 01/07/22 1302 01/07/22 1345 01/07/22 1450  BP: (!) 165/86 (!) 170/87 (!) 167/87 (!) 145/80  Pulse: 80 84 87 97  Resp: 17 18 17    Temp:   97.9 F (36.6 C)   TempSrc:   Oral   SpO2:  98% 98% 98% 100%  Weight:   102.9 kg   Height:       Neurology awake and alert ENT with mild pallor Cardiovascular with S1 and S2 present and rhythmic with no gallops, rubs or murmurs Respiratory with no rales or wheezing Abdomen with no distention Positive lower extremity edema ++ (non pitting) Right foot with dressing in place.  Data Reviewed:    Family  Communication: no family at the bedside   Disposition: Status is: Inpatient Remains inpatient appropriate because: pending outpatient HD unit   Planned Discharge Destination: Home     Natalie Wilbanks Gerome Apley, MD 01/07/2022 7:08 PM  For on call review [email or web address].

## 2022-01-07 NOTE — Progress Notes (Signed)
   01/07/22 1345  Vitals  Temp 97.9 F (36.6 C)  Pulse Rate 87  Resp 17  BP (!) 167/87  SpO2 98 %  Weight 102.9 kg  Type of Weight Post-Dialysis  Post Treatment  Dialyzer Clearance Clear  Duration of HD Treatment -hour(s) 4 hour(s)  Liters Processed 96  Fluid Removed (mL) 2500 mL  Tolerated HD Treatment Yes   Received patient in bed to unit.  Alert and oriented.  Informed consent signed and in chart.     Patient tolerated well.  Transported back to the room  Alert, without acute distress.  Hand-off given to patient's nurse.   Access used: [facility] Access issues: none  Total UF removed: 2.5L Medication(s) given: none    [name]

## 2022-01-07 NOTE — Progress Notes (Signed)
   VASCULAR SURGERY ASSESSMENT & PLAN:   DIABETIC FOOT INFECTION: This patient has extensive wound on the right foot which is being managed by podiatry.  They plan on following this as an outpatient.  Her noninvasive studies show biphasic Doppler signals in the posterior tibial and dorsalis pedis positions.  However given the extent of the wound I have recommended arteriography to be sure were not missing any significant infrainguinal arterial occlusive disease.  I had a long discussion with her about this this morning.  She is not willing to proceed with arteriography at this time.  For this reason, I would let podiatry continue to manage the wound.  If they have concerns about perfusion when she undergoes debridement ultimately then certainly we can try to convince her again to proceed with arteriography.  Now that she is on dialysis we can use contrast and get optimal imaging.  END-STAGE RENAL DISEASE: Her catheter is working well.  I would not favor placing access until the issue with the right lower extremity is resolved.  Based on her vein map she would likely require an AV graft and would be at high risk for infection.   SUBJECTIVE:   No specific complaints this morning.  PHYSICAL EXAM:   Vitals:   01/06/22 2003 01/07/22 0723 01/07/22 0920 01/07/22 0933  BP: (!) 166/89 (!) 146/80 (!) 149/71 (!) 156/80  Pulse: 88 81 82 80  Resp: 17  16 18   Temp: 98.1 F (36.7 C) 98.2 F (36.8 C) 98.1 F (36.7 C)   TempSrc: Axillary Oral Oral   SpO2: 100% 98% 98% 100%  Weight:      Height:       Her catheter is working well. The wound on the right foot is unchanged.  It is malodorous.  LABS:   Lab Results  Component Value Date   WBC 6.1 01/05/2022   HGB 8.8 (L) 01/05/2022   HCT 27.5 (L) 01/05/2022   MCV 89.0 01/05/2022   PLT 262 01/05/2022   Lab Results  Component Value Date   CREATININE 10.08 (H) 01/05/2022   No results found for: "INR", "PROTIME" CBG (last 3)  Recent Labs     01/04/22 1142 01/04/22 1220 01/07/22 0753  GLUCAP 76 104* 83    PROBLEM LIST:    Principal Problem:   Diabetic foot infection ([place]) Active Problems:   Diabetes mellitus with hyperglycemia, without long-term current use of insulin (HCC)   Hypokalemia   HTN (hypertension)   Chronic kidney disease (CKD), stage V (HCC)   Hypocalcemia   Metabolic acidosis, increased anion gap   Diarrhea   Obesity (BMI 30-39.9)   Ulcer of right foot, with necrosis of muscle (HCC)   Charcot's arthropathy   CURRENT MEDS:    sodium chloride   Intravenous Once   calcitRIOL  1 mcg Oral Daily   Chlorhexidine Gluconate Cloth  6 each Topical Q0600   collagenase   Topical Daily   gentamicin ointment   Topical BID   heparin  5,000 Units Subcutaneous Q8H   metroNIDAZOLE  500 mg Oral Q12H   nutrition supplement (JUVEN)  1 packet Oral BID BM   sevelamer carbonate  800 mg Oral TID WC   sodium bicarbonate  1,300 mg Oral BID    Deitra Mayo Office: [phone] 01/07/2022

## 2022-01-07 NOTE — Consult Note (Signed)
WOC Nurse Consult Note: Addendum: Shortly after writing the note last evening, this writer communicated with Dr. Baxter Flattery, who indicated that she would prefer to use an enzymatic debriding agent (collagenase/Santyl) as a topical agent. In addition, she indicated that her team would be following up with Dr Posey Pronto (Podiatric Medicine) to request evaluation for sharp debridement.  [facility] Nursing defers to the Provider's expertise and have discontinued the orders for betadine dressings in favor of daily collagenase.  [facility] nursing team will not follow, but will remain available to this patient, the nursing and medical teams.  Please re-consult if needed.  Thank you for inviting [place] to participate in this patient's Plan of Care.  Maudie Flakes, MSN, RN, CNS, [place], Serita Grammes, [employer], [employer] phone:  [phone]

## 2022-01-07 NOTE — Hospital Course (Signed)
[name] was admitted to the hospital with the working diagnosis of worsening diabetic foot wound infection.   56 yo female with the past medical history of T2DM, CKD stage V, and obesity who presented with worsening right foot wound. Patient was diagnosed with a right foot diabetic wound infection, on 12/15/21, she was prescribed oral clindamycin. Wound culture positive for staphylococcus warneri, streptococcus angionsis, and staphylococcus species. Unfortunately her wound continue to worsened, with foul smelling drainage. In the ED her blood pressure was 159/92, HR 78, RR 15 and 02 saturation 100%, her lungs had no wheezing or rhonchi, heart with S1 and S2 present and rhythmic with no gallops, abdomen with no distention, positive bilateral lower extremity, open wound right foot, on the plantar aspect of the mid foot with foul smelling drainage present.   Na 139, K 3,2 Cl 113, bicarbonate 10 glucose 96 bun 68 cr 10.9  Wbc 6,9 hgb 9,3 plt 277   Right foot radiograph with plantar wound at the level of the midfoot, no deep soft tissue gas extension. No radiographic findings of osteomyelitis  Findings consistent with chronic Charcot foot.   EKG 80 bpm, normal axis, qtc 513, sinus rhythm with no significant ST segment or T wave changes.   Further work up with foot MRI with no signs of osteomyelitis.  Podiatry evaluated the patient and recommended IV antibiotic therapy, wound care and close follow up as outpatient.  Vascular surgery recommended arteriogram, but she declined procedure during this hospitalization.   For her progressive renal disease, patient had a HD cathter placed and started on renal replacement therapy on 11/10.  Will need outpatient HD unit and close follow up with podiatry.   11/14  HD today, and then continue with outpatient HD T-TH-Sat.

## 2022-01-07 NOTE — Assessment & Plan Note (Signed)
No clinical signs of osteomyelitis.  Continue local wound care and as needed analgesics. Vascular US with no significant circulation compromise but ideally she will need aortogram to rule out any proximal stenosis (infra inguinal).   Plan to continue antibiotic therapy with Cefepime on HD for 2 weeks, plus oral metronidazole bid.  Local wound care and follow up with podiatry.

## 2022-01-07 NOTE — Progress Notes (Signed)
PT Cancellation Note  Patient Details Name: [name] MRN: [id number] DOB: August 27, 1965   Cancelled Treatment:    Reason Eval/Treat Not Completed: Patient at procedure or test/unavailable Pt off floor at HD. Will follow.   Marguarite Arbour A Yari Szeliga 01/07/2022, 10:01 AM Marisa Severin, PT, DPT Acute Rehabilitation Services Secure chat preferred Office [phone]

## 2022-01-07 NOTE — Progress Notes (Signed)
[name] NEPHROLOGY PROGRESS NOTE  Assessment/ Plan: Pt is a 56 y.o. yo female with a PMH of DM, obesity, HTN, CKD of unknown stage present with right foot infection seen as a consultation for worsening renal failure.  #New ESRD: Suspicious for chronic progressive CKD.  The kidney ultrasound with echogenic kidneys.  Stopped lisinopril, HCTZ and Lasix.  Started dialysis on 11/10 after placement of right IJ TDC by Dr. Scot Dock.  Receiving second HD today and has been tolerating well.  She prefers TTS is scheduled therefore we will plan for next HD tomorrow and possibly clip for TTS.  I will talk to Education officer, museum.  Per vascular surgeon, likely plan for AV graft as outpatient.  #Diabetic foot infection/PAD: It seems like the vascular is recommending arteriogram but patient is refusing.  On broad-spectrum antibiotics.  #Metabolic acidosis: Expect to improve with dialysis.  DC oral sodium bicarbonate.  Monitor lab.  #Hypokalemia: Dialyzing with high potassium bath.  # Anemia: Iron saturation 32%.  I will order Aranesp.  Received PRBC on 11/8.  # Secondary hyperparathyroidism: PTH 281.  Discontinue calcitriol.  Continue Renvela for hyperphosphatemia.  Monitor lab.  # HTN/volume: Exam consistent with volume overload, continue UF with HD, off of amlodipine.  Monitor blood pressure.  Subjective: Seen and examined at dialysis unit.  No chest pain or shortness of breath.  Tolerating HD well.  No new event. Objective Vital signs in last 24 hours: Vitals:   01/07/22 0933 01/07/22 1000 01/07/22 1005 01/07/22 1030  BP: (!) 156/80  (!) 152/89 (!) 153/84  Pulse: 80 79 79 80  Resp: 18 14 16 18   Temp:      TempSrc:      SpO2: 100% 99% 97% 100%  Weight:      Height:       Weight change:   Intake/Output Summary (Last 24 hours) at 01/07/2022 1046 Last data filed at 01/07/2022 0500 Gross per 24 hour  Intake 340 ml  Output --  Net 340 ml       Labs: RENAL PANEL Recent Labs     10/22/21 1125 12/06/21 1313 12/27/21 2015 01/01/22 0542 01/01/22 1834 01/02/22 0100 01/04/22 0336 01/04/22 0716 01/05/22 0931 01/06/22 1006  NA 147* 140 140 139  143  --  143 137 144 138  --   K 3.5 2.7* 2.8* 3.2*  3.2*  --  3.1* 2.7* 2.8* 3.4*  --   CL 111* 114* 113* 113*  118*  --  119* 111 113* 110  --   CO2 10* 10* 13* 10*  --  11* 13*  --  14*  --   GLUCOSE 72 108* 101* 96  93  --  79 85 71 116*  --   BUN 66* 52* 59* 68*  66*  --  61* 62* 58* 66*  --   CREATININE 9.26* 10.98* 10.38* 10.93*  12.50*  --  9.23* 10.39* 11.60* 10.08*  --   CALCIUM 5.8* 5.5* 5.5* 5.8*  --  5.4* 6.3*  --  6.6*  --   MG  --  1.2*  --   --   --   --   --   --   --   --   PHOS  --   --   --   --  7.8* 6.9* 7.3*  --  6.9*  --   ALBUMIN 3.5* 3.3* 3.0* 3.4*  --  2.2* 2.7*  --  2.8* 2.5*     Liver Function  Tests: Recent Labs  Lab 01/01/22 0542 01/02/22 0100 01/04/22 0336 01/05/22 0931 01/06/22 1006  AST 18  --   --   --  13*  ALT 10  --   --   --  8  ALKPHOS 74  --   --   --  64  BILITOT 0.5  --   --   --  0.2*  PROT 8.0  --   --   --  6.1*  ALBUMIN 3.4*   < > 2.7* 2.8* 2.5*   < > = values in this interval not displayed.   No results for input(s): "LIPASE", "AMYLASE" in the last 168 hours. No results for input(s): "AMMONIA" in the last 168 hours. CBC: Recent Labs    01/01/22 1834 01/02/22 0100 01/03/22 0857 01/04/22 0716 01/05/22 0931 01/07/22 0935  HGB  --  6.8* 9.6* 10.5* 8.8* 8.4*  MCV  --  92.0 87.8  --  89.0 88.8  FERRITIN 87  --   --   --   --   --   TIBC 220*  --   --   --   --   --   IRON 70  --   --   --   --   --     Cardiac Enzymes: Recent Labs  Lab 01/03/22 0857  CKTOTAL 172   CBG: Recent Labs  Lab 01/04/22 1142 01/04/22 1220 01/07/22 0753  GLUCAP 76 104* 83    Iron Studies: No results for input(s): "IRON", "TIBC", "TRANSFERRIN", "FERRITIN" in the last 72 hours. Studies/Results: No results found.  Medications: Infusions:  ceFEPime (MAXIPIME) IV 1  g (01/06/22 1842)    Scheduled Medications:  sodium chloride   Intravenous Once   calcitRIOL  1 mcg Oral Daily   Chlorhexidine Gluconate Cloth  6 each Topical Q0600   collagenase   Topical Daily   gentamicin ointment   Topical BID   heparin  5,000 Units Subcutaneous Q8H   metroNIDAZOLE  500 mg Oral Q12H   nutrition supplement (JUVEN)  1 packet Oral BID BM   sevelamer carbonate  800 mg Oral TID WC   sodium bicarbonate  1,300 mg Oral BID    have reviewed scheduled and prn medications.  Physical Exam: General:NAD, comfortable Heart:RRR, s1s2 nl Lungs:clear b/l, no crackle Abdomen:soft, Non-tender, non-distended Extremities: Bilateral leg wound chronic in nature, dressing applied on the right foot. Dialysis Access: Right IJ TDC in place.  Kue Fox Prasad Keshav Winegar 01/07/2022,10:46 AM  LOS: 6 days

## 2022-01-07 NOTE — Progress Notes (Signed)
New Dialysis Start   Patient identified as new dialysis start. Kidney Education packet assembled and given. Discussed the following items with patient:    Current medications and possible changes once started:  Discussed that patient's medications may change over time.  Ex; hypertension medications and diabetes medication.  Nephrologists will adjust as needed.  Fluid restrictions reviewed:  32 oz daily goal:  All liquids count; soups, ice, jello   Phosphorus and potassium: Handout given showing high potassium and phosphorus foods.  Alternative food and drink options given.  Family support:  no family present  Outpatient Clinic Resources:  Discussed roles of Outpatient clinic  staff and advised to make a list of needs, if any, to talk with outpatient staff if needed  Care plan schedule: Informed patient of Care Plans in outpatient setting and to participate in the care plan.  An invitation would be given from outpatient clinic.   Dialysis Access Options:  Reviewed access options with patients. Discussed in detail about care at home with new AVG & AVF. Reviewed checking bruit and thrill. If dialysis catheter present, educated that patient could not take showers.  Catheter dressing changes were to be done by outpatient clinic staff only  Home therapy options:  Educated patient about home therapy options:  PD vs home hemo.  Patient stated she is not interested at this time in home therapies.   Patient verbalized understanding. Will continue to round on patient during admission.    Tarique Loveall W Johanny Segers, RN   

## 2022-01-07 NOTE — Assessment & Plan Note (Signed)
Calculated BMI 36,6 consistent with obesity class 2.

## 2022-01-07 NOTE — Assessment & Plan Note (Addendum)
Continue blood pressure monitoring. At home patient on HCTZ and lisinopril combination. Today her blood pressure is 140 to 160 mmHg.  Will add amlodipine,

## 2022-01-07 NOTE — Assessment & Plan Note (Signed)
Fasting glucose is 148 mg/dl.  Patient off insulin therapy.

## 2022-01-07 NOTE — Progress Notes (Signed)
OT Cancellation Note  Patient Details Name: [name] MRN: [id number] DOB: 1965/03/25   Cancelled Treatment:    Reason Eval/Treat Not Completed: Patient at procedure or test/ unavailable (HD)  Malka So 01/07/2022, 9:46 AM Cleta Alberts, OTR/L Acute Rehabilitation Services Office: [phone]

## 2022-01-07 NOTE — Progress Notes (Signed)
Requested to see pt for out-pt HD needs at d/c. Met with pt at bedside while receiving HD. Pt lives and works in [place] and prefers [facility] or [facility] if possible. Referral submitted to [facility] admissions for review. Pt is uninsured therefore will require financial clearance for out-pt HD services. Pt plans to drive self to HD appts. Pt works F/T as a Teacher, early years/pre. Will assist as needed.   Melven Sartorius Renal Navigator [phone]

## 2022-01-08 LAB — CBC
HCT: 27.5 % — ABNORMAL LOW (ref 36.0–46.0)
Hemoglobin: 8.8 g/dL — ABNORMAL LOW (ref 12.0–15.0)
MCH: 29.2 pg (ref 26.0–34.0)
MCHC: 32 g/dL (ref 30.0–36.0)
MCV: 91.4 fL (ref 80.0–100.0)
Platelets: 207 10*3/uL (ref 150–400)
RBC: 3.01 MIL/uL — ABNORMAL LOW (ref 3.87–5.11)
RDW: 14.7 % (ref 11.5–15.5)
WBC: 7.3 10*3/uL (ref 4.0–10.5)
nRBC: 0 % (ref 0.0–0.2)

## 2022-01-08 LAB — RENAL FUNCTION PANEL
Albumin: 2.5 g/dL — ABNORMAL LOW (ref 3.5–5.0)
Anion gap: 12 (ref 5–15)
BUN: 41 mg/dL — ABNORMAL HIGH (ref 6–20)
CO2: 24 mmol/L (ref 22–32)
Calcium: 7.4 mg/dL — ABNORMAL LOW (ref 8.9–10.3)
Chloride: 102 mmol/L (ref 98–111)
Creatinine, Ser: 5.37 mg/dL — ABNORMAL HIGH (ref 0.44–1.00)
GFR, Estimated: 9 mL/min — ABNORMAL LOW (ref >60)
Glucose, Bld: 148 mg/dL — ABNORMAL HIGH (ref 70–99)
Phosphorus: 3.5 mg/dL (ref 2.5–4.6)
Potassium: 3.2 mmol/L — ABNORMAL LOW (ref 3.5–5.1)
Sodium: 138 mmol/L (ref 135–145)

## 2022-01-08 MED ORDER — ALTEPLASE 2 MG IJ SOLR
2.0000 mg | Freq: Once | INTRAMUSCULAR | Status: DC | PRN
  Filled 2022-01-08: qty 2

## 2022-01-08 MED ORDER — ANTICOAGULANT SODIUM CITRATE 4% (200MG/5ML) IV SOLN
5.0000 mL | Status: DC | PRN
  Filled 2022-01-08: qty 5

## 2022-01-08 MED ORDER — AMLODIPINE BESYLATE 5 MG PO TABS
5.0000 mg | ORAL_TABLET | Freq: Every day | ORAL | Status: DC
  Administered 2022-01-08 – 2022-01-10 (×3): 5 mg via ORAL
  Filled 2022-01-08 (×3): qty 1

## 2022-01-08 MED ORDER — HEPARIN SODIUM (PORCINE) 1000 UNIT/ML DIALYSIS
20.0000 [IU]/kg | INTRAMUSCULAR | Status: DC | PRN
  Administered 2022-01-08: 2200 [IU] via INTRAVENOUS_CENTRAL
  Filled 2022-01-08 (×3): qty 3

## 2022-01-08 MED ORDER — VITAMIN C 500 MG PO TABS
500.0000 mg | ORAL_TABLET | Freq: Every day | ORAL | Status: DC
  Administered 2022-01-08 – 2022-01-10 (×3): 500 mg via ORAL
  Filled 2022-01-08 (×3): qty 1

## 2022-01-08 MED ORDER — HEPARIN SODIUM (PORCINE) 1000 UNIT/ML DIALYSIS
1000.0000 [IU] | INTRAMUSCULAR | Status: DC | PRN
  Administered 2022-01-08: 1000 [IU]
  Filled 2022-01-08 (×4): qty 1

## 2022-01-08 MED ORDER — HEPARIN SODIUM (PORCINE) 1000 UNIT/ML IJ SOLN
INTRAMUSCULAR | Status: AC
  Administered 2022-01-08: 1000 [IU]
  Filled 2022-01-08: qty 4

## 2022-01-08 NOTE — Progress Notes (Signed)
Initial Nutrition Assessment  DOCUMENTATION CODES:   Obesity unspecified  INTERVENTION:  Liberalize diet from a renal/carb modified to a 2 gram sodium diet to provide widest variety of menu options to enhance nutritional adequacy Discontinue Juven BID Vitamin C 500mg  daily  NUTRITION DIAGNOSIS:   Increased nutrient needs related to wound healing as evidenced by estimated needs.  GOAL:   Patient will meet greater than or equal to 90% of their needs   MONITOR:   PO intake, Labs, Weight trends, I & O's, Skin  REASON FOR ASSESSMENT:   Consult Diet education  ASSESSMENT:   Pt admitted with R foot diabetic wound infection. PMH significant for T2DM, CKD stage V and obesity.  No signs of osteomyelitis. Vascular surgery recommended arteriogram but pt declines.   D/t progressive renal disease pt had HD cath placed and started on HD 11/10. HD today then plans for d/c.   RD consulted for new HD diet education. Pt had received prior diet education by RD for nutrition related to dialysis. She had no further questions regarding handouts provided. Made pt aware she will also be followed at the dialysis center should additional questions arise.   She reports eating well and was waiting on lunch to be delivered. She states that she has been eating 100% of each meal. Pt has remained on a renal/carb modified diet during admission. Since starting dialysis her sodium and phos have normalized and her potassium is low today. D/t her increased nutritional needs, she would benefit from a less restrictive diet to help increase her nutritional adequacy.   Post HD net UF 2.5L (11/13)  Edema: mild pitting BLE  Pt's weight on 8/24 noted to be 122.5 kg and current post HD weight (11/13) is 102.9 kg. Uncertain what pt's true dry weight is and if this is attributed to true dry weight loss versus fluctuations in fluid status.   Medications: renvela, IV abx  Labs: potassium 3.2, BUN 41, Cr 5.37, GFR 9,  CBG's 76-104 x24 hours  I/O's: -862ml since admit  NUTRITION - FOCUSED PHYSICAL EXAM: Deferred to follow up.   Diet Order:   Diet Order             Diet 2 gram sodium Room service appropriate? Yes; Fluid consistency: Thin; Fluid restriction: 1500 mL Fluid  Diet effective now                   EDUCATION NEEDS:   Education needs have been addressed  Skin:  Skin Assessment: Skin Integrity Issues: Skin Integrity Issues:: Other (Comment) Other: R DM foot infection  Last BM:  11/13  Height:   Ht Readings from Last 1 Encounters:  01/01/22 5\' 6"  (1.676 m)    Weight:   Wt Readings from Last 1 Encounters:  01/07/22 102.9 kg    Ideal Body Weight:  59.1 kg  BMI:  Body mass index is 36.62 kg/m.  Estimated Nutritional Needs:   Kcal:  1700-1900  Protein:  85-100g  Fluid:  >/=1.7L  Clayborne Dana, RDN, LDN Clinical Nutrition

## 2022-01-08 NOTE — Progress Notes (Addendum)
Pharmacy Antibiotic Note  [name] is a 56 y.o. female admitted on 12/31/2021 with diabetic foot infection with cellulitis and ulcer.  Pharmacy has been consulted for cefepime dosing. 11/10 MRI confirms severe Charcot arthropathy -with notable cellulitis and no definitive abscess or draining tract.   Patient advanced to ESRD now on iHD. Received iHD yesterday Mon 11/13 and nephro planning for transition to T/Th/Sat schedule (per pt preference).   Plan: Continue Cefepime 1g Q24H (to be given after iHD on HD days) Continue flagyl 500mg  PO BID  LOT = 2 weeks per ID   F/u nephrology plans for dose adjustments  Height: 5\' 6"  (167.6 cm) Weight: 102.9 kg (226 lb 13.7 oz) IBW/kg (Calculated) : 59.3  Temp (24hrs), Avg:98.1 F (36.7 C), Min:97.9 F (36.6 C), Max:98.3 F (36.8 C)  Recent Labs  Lab 01/02/22 0100 01/03/22 0857 01/04/22 0336 01/04/22 0716 01/05/22 0931 01/07/22 0935 01/08/22 0741  WBC 4.9 5.8  --   --  6.1 6.4 7.3  CREATININE 9.23*  --  10.39* 11.60* 10.08* 7.19* 5.37*     Estimated Creatinine Clearance: 14.5 mL/min (A) (by C-G formula based on SCr of 5.37 mg/dL (H)).    Allergies  Allergen Reactions   Penicillins Rash    Antimicrobials this admission: Vanc 11/7  Cefepime 11/7 >> Dapto 11/8 >>11/10 Flagyl 11/8 >>11/10; 11/12>   Microbiology results: 10/21 Wound: Staph warneri, Strep anginosis, CoN staph 11/7 Bcx: GNR in 1 anaerobic bottle, BCID negative 11/10 MRSA PCR: neg  Thank you for allowing pharmacy to be a part of this patient's care.  Wilson Singer, PharmD Clinical Pharmacist 01/08/2022 8:33 AM

## 2022-01-08 NOTE — Evaluation (Signed)
Occupational Therapy Evaluation Patient Details Name: [name] MRN: [id number] DOB: 1965-10-16 Today's Date: 01/08/2022   History of Present Illness Patient is a 56 y/o female who presents on 12/31/21 with a diabetic foot wound infection. MRI-shows local cellulitis and no abscess/or osteo to her foot which is consistent with charcot's foot. s/p tunneled HD catheter placed 11/9 with HD starting 11/10. PMH includes DM, obesity, asthma, HTN, CKD stage V,   Clinical Impression   Pt admitted as above presenting with deficits as listed below (refer to OT problem list). Pt is currently Mod I bed mobility, Min guard-supervision assist for functional mobility related to ADL's and transfers. Pt plans to retrun home with PRN assist from her daughter, whom works during the day. Pt was educated in role of OT and kitchen set-up was reviewed secondary to pt reports of back pain at home at times. She should benefit from acute OT to assist in maximizing independence with ADL's and self care tasks at overall Mod I level.      Recommendations for follow up therapy are one component of a multi-disciplinary discharge planning process, led by the attending physician.  Recommendations may be updated based on patient status, additional functional criteria and insurance authorization.   Follow Up Recommendations  No OT follow up     Assistance Recommended at Discharge PRN  Patient can return home with the following A little help with walking and/or transfers;Assistance with cooking/housework;Help with stairs or ramp for entrance;A little help with bathing/dressing/bathroom    Functional Status Assessment  Patient has had a recent decline in their functional status and demonstrates the ability to make significant improvements in function in a reasonable and predictable amount of time.  Equipment Recommendations  None recommended by OT    Recommendations for Other Services       Precautions / Restrictions  Precautions Precautions: Fall Required Braces or Orthoses: Other Brace Other Brace: CAM boot RLE Restrictions Weight Bearing Restrictions: Yes RLE Weight Bearing: Non weight bearing      Mobility Bed Mobility Overal bed mobility: Modified Independent    Transfers Overall transfer level: Needs assistance Equipment used: Rolling walker (2 wheels) (Cam walker) Transfers: Sit to/from Stand, Bed to chair/wheelchair/BSC Sit to Stand: Min guard, From elevated surface     Step pivot transfers: Min guard, Supervision     General transfer comment: Pt is Min guard-supervision assist for safety with RW. Will likely progress quickly to Mod I as pt reports increased independence overall      Balance Overall balance assessment: Mild deficits observed, not formally tested         ADL either performed or assessed with clinical judgement   ADL Overall ADL's : Needs assistance/impaired Eating/Feeding: Set up;Bed level;Sitting   Grooming: Standing;Supervision/safety   Upper Body Bathing: Set up;Sitting   Lower Body Bathing: Sit to/from stand;Sitting/lateral leans;Min guard;Minimal assistance   Upper Body Dressing : Set up;Sitting   Lower Body Dressing: Min guard;Sit to/from stand;Sitting/lateral leans   Toilet Transfer: Min guard;Ambulation;Rolling walker (2 wheels);Grab bars   Toileting- Clothing Manipulation and Hygiene: Supervision/safety;Sitting/lateral lean;Min guard;Sit to/from Nurse, children's Details (indicate cue type and reason): TBD Functional mobility during ADLs: Min guard;Rolling walker (2 wheels) General ADL Comments: Pt seen for OT assessment and pt education. Reviewed home set-up in kitchen and to sit for activity if having back pain, also educated to place frequently used items on countertop level to avoid frequent arching of back which pt reports causes pain. Pt  lives with adult daughter whom can provide intermittent PRN assist at d/c.     Vision  Baseline Vision/History: 1 Wears glasses (reading) Ability to See in Adequate Light: 0 Adequate Patient Visual Report: No change from baseline Vision Assessment?: No apparent visual deficits            Pertinent Vitals/Pain Pain Assessment Pain Assessment: No/denies pain     Hand Dominance Left   Extremity/Trunk Assessment Upper Extremity Assessment Upper Extremity Assessment: Overall WFL for tasks assessed   Lower Extremity Assessment Lower Extremity Assessment: Defer to PT evaluation       Communication Communication Communication: No difficulties   Cognition Arousal/Alertness: Awake/alert Behavior During Therapy: WFL for tasks assessed/performed, Agitated Overall Cognitive Status: Within Functional Limits for tasks assessed       General Comments  Pt anxious to return home and regain independence.            Home Living Family/patient expects to be discharged to:: Private residence Living Arrangements: Children Available Help at Discharge: Family;Available PRN/intermittently Type of Home: Apartment Home Access: Stairs to enter Entrance Stairs-Number of Steps: Flight Entrance Stairs-Rails: Right Home Layout: Two level Alternate Level Stairs-Number of Steps: Flight Alternate Level Stairs-Rails: Right Bathroom Shower/Tub: Teacher, early years/pre: Standard     Home Equipment: Cane - single point   Additional Comments: Pt reports sponge bathes      Prior Functioning/Environment Prior Level of Function : Independent/Modified Independent   Mobility Comments: Pt reports Mod I )has SPC, was ot using), Drives ADLs Comments: Pt was mod I PTA for ADL's, was working as bus Nurse, mental health Problem List: Impaired balance (sitting and/or standing);Decreased knowledge of use of DME or AE;Obesity      OT Treatment/Interventions: Self-care/ADL training;Patient/family education;Therapeutic activities;DME and/or AE instruction    OT Goals(Current  goals can be found in the care plan section) Acute Rehab OT Goals Patient Stated Goal: Go home OT Goal Formulation: With patient Time For Goal Achievement: 01/22/22 Potential to Achieve Goals: Good  OT Frequency: Min 2X/week    Co-evaluation              AM-PAC OT "6 Clicks" Daily Activity     Outcome Measure Help from another person eating meals?: None Help from another person taking care of personal grooming?: A Little Help from another person toileting, which includes using toliet, bedpan, or urinal?: A Little Help from another person bathing (including washing, rinsing, drying)?: A Little Help from another person to put on and taking off regular upper body clothing?: None Help from another person to put on and taking off regular lower body clothing?: A Little 6 Click Score: 20   End of Session Equipment Utilized During Treatment: Rolling walker (2 wheels);Other (comment) (CAM walker/boot) Nurse Communication: Mobility status  Activity Tolerance: Patient tolerated treatment well;No increased pain Patient left: in chair;with call bell/phone within reach  OT Visit Diagnosis: Unsteadiness on feet (R26.81);Other abnormalities of gait and mobility (R26.89)                Time: 0802-0828 OT Time Calculation (min): 26 min Charges:  OT General Charges $OT Visit: 1 Visit OT Evaluation $OT Eval Low Complexity: 1 Low  Caytlin Better Beth Dixon, OTR/L 01/08/2022, 8:43 AM

## 2022-01-08 NOTE — Progress Notes (Signed)
Orthopedic Tech Progress Note Patient Details:  Gidget Quizhpi [birth year] [phone]  Called in order to [employer] for a OFFLOADING SHOE  Patient ID: Natalya Domzalski, female   DOB: 28-Feb-1965, 56 y.o.   MRN: [id number]  Janit Pagan 01/08/2022, 1:55 PM

## 2022-01-08 NOTE — Progress Notes (Signed)
Pt's referral to [facility] is under review. Pt's case will require financial clearance due to pt being uninsured. Update provided to nephrologist.   Melven Sartorius Renal Navigator [phone]

## 2022-01-08 NOTE — Progress Notes (Addendum)
Progress Note   Patient: [name] FAO:[id number] DOB: 1965-03-03 DOA: 12/31/2021     7 DOS: the patient was seen and examined on 01/08/2022   Brief hospital course: [name] was admitted to the hospital with the working diagnosis of worsening diabetic foot wound infection.   56 yo female with the past medical history of T2DM, CKD stage V, and obesity who presented with worsening right foot wound. Patient was diagnosed with a right foot diabetic wound infection, on 12/15/21, she was prescribed oral clindamycin. Wound culture positive for staphylococcus warneri, streptococcus angionsis, and staphylococcus species. Unfortunately her wound continue to worsened, with foul smelling drainage. In the ED her blood pressure was 159/92, HR 78, RR 15 and 02 saturation 100%, her lungs had no wheezing or rhonchi, heart with S1 and S2 present and rhythmic with no gallops, abdomen with no distention, positive bilateral lower extremity, open wound right foot, on the plantar aspect of the mid foot with foul smelling drainage present.   Na 139, K 3,2 Cl 113, bicarbonate 10 glucose 96 bun 68 cr 10.9  Wbc 6,9 hgb 9,3 plt 277   Right foot radiograph with plantar wound at the level of the midfoot, no deep soft tissue gas extension. No radiographic findings of osteomyelitis  Findings consistent with chronic Charcot foot.   EKG 80 bpm, normal axis, qtc 513, sinus rhythm with no significant ST segment or T wave changes.   Further work up with foot MRI with no signs of osteomyelitis.  Podiatry evaluated the patient and recommended IV antibiotic therapy, wound care and close follow up as outpatient.  Vascular surgery recommended arteriogram, but she declined procedure during this hospitalization.   For her progressive renal disease, patient had a HD cathter placed and started on renal replacement therapy on 11/10.  Will need outpatient HD unit and close follow up with podiatry.   11/14  HD today, and then  continue with outpatient HD T-TH-Sat.   Assessment and Plan: * Diabetic foot infection ([place]) No clinical signs of osteomyelitis.  Continue local wound care and as needed analgesics. Vascular US with no significant circulation compromise but ideally she will need aortogram to rule out any proximal stenosis (infra inguinal).   Antibiotic therapy with Cefepime on HD for 2 weeks, plus oral metronidazole bid.  Local wound care and follow up with podiatry.   Chronic kidney disease (CKD), stage V (HCC) Progressive CKD now ESRD. Hypocalcemia.  Hypokalemia. Metabolic anion gap acidosis.  TDC placed on 11/10 First renal replacement therapy started on 11/11.  K today is 3,2 with serum bicarbonate at 24 and Na 138.   Patient is tolerating well HD, plan for HD today and then continue with Tuesday, Thursday and Saturday schedule.   Anemia of chronic kidney disease, Hgb has been stable, continue follow up on cell counts.  Continue with EPO.   Metabolic bone disease, continue with sevelamer.   HTN (hypertension) At home patient on HCTZ and lisinopril combination.  Systolic blood pressure 696 to 135 mmHg. Plan to continue blood pressure support with amlodipine  5 mg. Continue blood pressure monitoring.   Type 2 diabetes mellitus with renal complication (HCC) Fasting glucose is 148 mg/dl.  Patient off insulin therapy.   Obesity (BMI 30-39.9) Calculated BMI 36,6 consistent with obesity class 2.         Subjective: Patient with no chest pain or dyspnea, no foot pain   Physical Exam: Vitals:   01/07/22 1302 01/07/22 1345 01/07/22 1450 01/08/22 0803  BP: (!) 170/87 (!) 167/87 (!) 145/80 135/69  Pulse: 84 87 97 98  Resp: 18 17  16   Temp:  97.9 F (36.6 C)  98.3 F (36.8 C)  TempSrc:  Oral  Oral  SpO2: 98% 98% 100% 100%  Weight:  102.9 kg    Height:       Neurology awake and alert ENT with mild pallor Cardiovascular with S1 and S2 present and rhythmic Respiratory with no  rales or wheezing Abdomen with no distention  No lower extremity edema Right foot with dressing in place, orthopedic shoe in place.  Data Reviewed:    Family Communication: no family at the bedside   Disposition: Status is: Inpatient Remains inpatient appropriate because: pending outpatient HD   Planned Discharge Destination: Home     Author: Tawni Millers, MD 01/08/2022 10:39 AM  For on call review [email or web address].

## 2022-01-08 NOTE — Evaluation (Signed)
Physical Therapy Evaluation  Patient Details Name: [name] MRN: [id number] DOB: 1965-11-13 Today's Date: 01/08/2022  History of Present Illness  Patient is a 56 y/o female who presents on 12/31/21 with a diabetic foot wound infection. MRI shows local cellulitis and no abscess/or osteo to her foot which is consistent with charcot's foot. She is s/p tunneled HD catheter placed 11/9 with HD starting 11/10. PMH includes DM, obesity, asthma, HTN, CKD stage V.   Clinical Impression  Pt admitted with above diagnosis. Pt currently with functional limitations due to the deficits listed below (see PT Problem List). At the time of PT eval pt was able to perform transfers and ambulation with gross min guard assist to supervision for safety, and RW for support. Pt with increased difficulty maintaining NWB precautions at this time. PT reached out to podiatry after session to discuss restrictions, as pt will need to be able to negotiate a flight of stairs at a time, and is declining a wheelchair to help maintain NWB status. MD gave verbal order for [facility] with offloading shoe for heel. In next PT session, focus should be on progressing gait and negotiating stairs within new precautions. Acutely, pt will benefit from skilled PT to increase their independence and safety with mobility to allow discharge to the venue listed below.          Recommendations for follow up therapy are one component of a multi-disciplinary discharge planning process, led by the attending physician.  Recommendations may be updated based on patient status, additional functional criteria and insurance authorization.  Follow Up Recommendations No PT follow up      Assistance Recommended at Discharge PRN  Patient can return home with the following  A little help with walking and/or transfers;A little help with bathing/dressing/bathroom;Assistance with cooking/housework;Assist for transportation;Help with stairs or ramp for entrance     Equipment Recommendations Rolling walker (2 wheels);BSC/3in1;Wheelchair (measurements PT);Wheelchair cushion (measurements PT) (Pt declining wheelchair)  Recommendations for Other Services       Functional Status Assessment Patient has had a recent decline in their functional status and demonstrates the ability to make significant improvements in function in a reasonable and predictable amount of time.     Precautions / Restrictions Precautions Precautions: Fall Required Braces or Orthoses: Other Brace Other Brace: CAM boot RLE - may update to heel offloading shoe in future Restrictions Weight Bearing Restrictions: Yes RLE Weight Bearing: Partial weight bearing (Per Dr. Posey Pronto (Podiatry)) RLE Partial Weight Bearing Percentage or Pounds: 50%      Mobility  Bed Mobility               General bed mobility comments: Pt was received sitting up in the recliner.    Transfers Overall transfer level: Needs assistance Equipment used: Rolling walker (2 wheels) Transfers: Sit to/from Stand Sit to Stand: Supervision           General transfer comment: Poor adherence to NWB precautions. Pt able to stand without assist however unable/unwilling to do so without pushing up through RLE. Pt would likely need some assistance if maintaining NWB appropriately.    Ambulation/Gait Ambulation/Gait assistance: Min guard Gait Distance (Feet): 25 Feet Assistive device: Rolling walker (2 wheels) Gait Pattern/deviations: Step-through pattern, Decreased stride length, Trunk flexed Gait velocity: Decreased due to mobilizing in small space. Gait velocity interpretation: 1.31 - 2.62 ft/sec, indicative of limited community ambulator   General Gait Details: In room only due to inability to maintain NWB status. Pt attempting to off weight RLE  however unable to fully maintain. Pt moving quickly around room despite cues, and gait training was ended for optimal safety.  Stairs             Wheelchair Mobility    Modified Rankin (Stroke Patients Only)       Balance Overall balance assessment: Mild deficits observed, not formally tested                                           Pertinent Vitals/Pain Pain Assessment Pain Assessment: No/denies pain    Home Living Family/patient expects to be discharged to:: Private residence Living Arrangements: Children Available Help at Discharge: Family;Available PRN/intermittently Type of Home: Apartment Home Access: Stairs to enter Entrance Stairs-Rails: Right Entrance Stairs-Number of Steps: Flight Alternate Level Stairs-Number of Steps: Flight Home Layout: Two level Home Equipment: Cane - single point Additional Comments: Pt reports sponge bathes    Prior Function Prior Level of Function : Independent/Modified Independent             Mobility Comments: Pt reports Mod I (has SPC, was not using), Drives ADLs Comments: Pt was mod I PTA for ADL's, was working as bus Tree surgeon   Dominant Hand: Left    Extremity/Trunk Assessment   Upper Extremity Assessment Upper Extremity Assessment: Overall WFL for tasks assessed    Lower Extremity Assessment Lower Extremity Assessment: RLE deficits/detail RLE Deficits / Details: Decreased ankle AROM. Mildly decreased strength (generalized weakness) bilaterally which pt attributes to her back.    Cervical / Trunk Assessment Cervical / Trunk Assessment: Other exceptions Cervical / Trunk Exceptions: Forward head posture with rounded shoulders  Communication   Communication: No difficulties  Cognition Arousal/Alertness: Awake/alert Behavior During Therapy: WFL for tasks assessed/performed Overall Cognitive Status: Impaired/Different from baseline Area of Impairment: Safety/judgement, Awareness, Memory                     Memory: Decreased recall of precautions   Safety/Judgement: Decreased awareness of safety Awareness:  Emergent   General Comments: Pt with decreased awareness of weight bearing restrictions (NWB at the time of eval - MD updated to [facility] after session) and demonstrates difficulty differentiating between her physical ability to bear weight and walk on her RLE vs MD orders for NWB and reasoning behind it.        General Comments      Exercises     Assessment/Plan    PT Assessment Patient needs continued PT services  PT Problem List Decreased strength;Decreased range of motion;Decreased activity tolerance;Decreased balance;Decreased mobility;Decreased knowledge of use of DME;Decreased safety awareness;Decreased knowledge of precautions;Decreased cognition       PT Treatment Interventions Gait training;DME instruction;Stair training;Functional mobility training;Therapeutic activities;Therapeutic exercise;Balance training;Cognitive remediation;Patient/family education;Wheelchair mobility training    PT Goals (Current goals can be found in the Care Plan section)  Acute Rehab PT Goals Patient Stated Goal: Go back to work PT Goal Formulation: With patient Time For Goal Achievement: 01/22/22 Potential to Achieve Goals: Good    Frequency Min 3X/week     Co-evaluation               AM-PAC PT "6 Clicks" Mobility  Outcome Measure Help needed turning from your back to your side while in a flat bed without using bedrails?: None Help needed moving from lying on your back to sitting on the side  of a flat bed without using bedrails?: None Help needed moving to and from a bed to a chair (including a wheelchair)?: A Little Help needed standing up from a chair using your arms (e.g., wheelchair or bedside chair)?: A Little Help needed to walk in hospital room?: A Little Help needed climbing 3-5 steps with a railing? : A Little 6 Click Score: 20    End of Session Equipment Utilized During Treatment: Gait belt;Other (comment) (CAM boot) Activity Tolerance: Patient tolerated treatment  well Patient left: in chair;with call bell/phone within reach Nurse Communication: Mobility status PT Visit Diagnosis: Unsteadiness on feet (R26.81);Difficulty in walking, not elsewhere classified (R26.2)    Time: 2957-4734 PT Time Calculation (min) (ACUTE ONLY): 17 min   Charges:   PT Evaluation $PT Eval Moderate Complexity: 1 Mod          Rolinda Roan, PT, DPT Acute Rehabilitation Services Secure Chat Preferred Office: [phone]   Thelma Comp 01/08/2022, 2:30 PM

## 2022-01-08 NOTE — Progress Notes (Signed)
Received patient in bed to unit.  Alert and oriented.  Informed consent signed and in chart.   Treatment initiated: 0488 Treatment completed: 1933  Patient tolerated well.  Transported back to the room  Alert, without acute distress.  Hand-off given to patient's nurse.   Access used: right HD catheter Access issues: reverse line d/t high ap  Total UF removed: 2079ml Medication(s) given: heparin Post HD VS: 98.2, 94, 19, 160/84,100% RA Post HD weight: 105.9 kg   [facility]

## 2022-01-08 NOTE — Progress Notes (Signed)
[facility] NEPHROLOGY PROGRESS NOTE  Assessment/ Plan: Pt is a 56 y.o. yo female with a PMH of DM, obesity, HTN, CKD of unknown stage present with right foot infection seen as a consultation for worsening renal failure.  #New ESRD: Suspicious for chronic progressive CKD.  The kidney ultrasound with echogenic kidneys.  Stopped lisinopril, HCTZ and Lasix.  Started dialysis on 11/10 after placement of right IJ TDC by Dr. Scot Dock. Per VVS  plan for AV graft as outpatient. She prefers TTS is schedule and SW is following. Awaiting financial clearance now. She received HD yesterday and tolerated well.  Plan for 3rd HD today to make her in TTS schedule as long as staffing allows. Pt understands.   #Diabetic foot infection/PAD: It seems like the vascular is recommending arteriogram but patient is refusing.  On broad-spectrum antibiotics.  #Metabolic acidosis: now on HD.  DC oral sodium bicarbonate.  Monitor lab.  #Hypokalemia: Dialyzing with high potassium bath.  # Anemia: Iron saturation 32%. Continue Aranesp.  Received PRBC on 11/8.  # Secondary hyperparathyroidism: PTH 281.  Discontinue calcitriol.  Continue Renvela for hyperphosphatemia.  Monitor lab.  # HTN/volume: BP acceptable, continue UF with HD, off of amlodipine.  Monitor blood pressure.  Discussed with renal navigator.  Subjective: Seen and examined.  She is sitting on chair comfortable, not in distress.  No chest pain or shortness of breath.  No new event. Objective Vital signs in last 24 hours: Vitals:   01/07/22 1345 01/07/22 1450 01/08/22 0803 01/08/22 1318  BP: (!) 167/87 (!) 145/80 135/69 (!) 140/65  Pulse: 87 97 98 89  Resp: 17  16 17   Temp: 97.9 F (36.6 C)  98.3 F (36.8 C) 98.2 F (36.8 C)  TempSrc: Oral  Oral Oral  SpO2: 98% 100% 100% 100%  Weight: 102.9 kg     Height:       Weight change:   Intake/Output Summary (Last 24 hours) at 01/08/2022 1452 Last data filed at 01/07/2022 2200 Gross per  24 hour  Intake 360 ml  Output --  Net 360 ml        Labs: RENAL PANEL Recent Labs    10/22/21 1125 12/06/21 1313 12/27/21 2015 01/01/22 0542 01/01/22 1834 01/02/22 0100 01/04/22 0336 01/04/22 0716 01/05/22 0931 01/06/22 1006 01/07/22 0935 01/08/22 0741  NA 147* 140 140 139  143  --  143 137 144 138  --  141 138  K 3.5 2.7* 2.8* 3.2*  3.2*  --  3.1* 2.7* 2.8* 3.4*  --  2.9* 3.2*  CL 111* 114* 113* 113*  118*  --  119* 111 113* 110  --  104 102  CO2 10* 10* 13* 10*  --  11* 13*  --  14*  --  22 24  GLUCOSE 72 108* 101* 96  93  --  79 85 71 116*  --  114* 148*  BUN 66* 52* 59* 68*  66*  --  61* 62* 58* 66*  --  50* 41*  CREATININE 9.26* 10.98* 10.38* 10.93*  12.50*  --  9.23* 10.39* 11.60* 10.08*  --  7.19* 5.37*  CALCIUM 5.8* 5.5* 5.5* 5.8*  --  5.4* 6.3*  --  6.6*  --  6.9* 7.4*  MG  --  1.2*  --   --   --   --   --   --   --   --   --   --   PHOS  --   --   --   --  7.8* 6.9* 7.3*  --  6.9*  --  4.0 3.5  ALBUMIN 3.5* 3.3* 3.0* 3.4*  --  2.2* 2.7*  --  2.8* 2.5* 2.5* 2.5*      Liver Function Tests: Recent Labs  Lab 01/06/22 1006 01/07/22 0935 01/08/22 0741  AST 13*  --   --   ALT 8  --   --   ALKPHOS 64  --   --   BILITOT 0.2*  --   --   PROT 6.1*  --   --   ALBUMIN 2.5* 2.5* 2.5*    No results for input(s): "LIPASE", "AMYLASE" in the last 168 hours. No results for input(s): "AMMONIA" in the last 168 hours. CBC: Recent Labs    01/01/22 1834 01/02/22 0100 01/03/22 0857 01/04/22 0716 01/05/22 0931 01/07/22 0935 01/08/22 0741  HGB  --    < > 9.6* 10.5* 8.8* 8.4* 8.8*  MCV  --    < > 87.8  --  89.0 88.8 91.4  FERRITIN 87  --   --   --   --   --   --   TIBC 220*  --   --   --   --   --   --   IRON 70  --   --   --   --   --   --    < > = values in this interval not displayed.     Cardiac Enzymes: Recent Labs  Lab 01/03/22 0857  CKTOTAL 172    CBG: Recent Labs  Lab 01/04/22 1142 01/04/22 1220 01/07/22 0753 01/07/22 1619  GLUCAP  76 104* 83 101*     Iron Studies: No results for input(s): "IRON", "TIBC", "TRANSFERRIN", "FERRITIN" in the last 72 hours. Studies/Results: No results found.  Medications: Infusions:  anticoagulant sodium citrate     ceFEPime (MAXIPIME) IV 1 g (01/07/22 1752)    Scheduled Medications:  sodium chloride   Intravenous Once   amLODipine  5 mg Oral Daily   ascorbic acid  500 mg Oral Daily   Chlorhexidine Gluconate Cloth  6 each Topical Q0600   Chlorhexidine Gluconate Cloth  6 each Topical Q0600   collagenase   Topical Daily   darbepoetin (ARANESP) injection - DIALYSIS  60 mcg Subcutaneous Q Tue-HD   gentamicin ointment   Topical BID   heparin  5,000 Units Subcutaneous Q8H   metroNIDAZOLE  500 mg Oral Q12H   sevelamer carbonate  800 mg Oral TID WC    have reviewed scheduled and prn medications.  Physical Exam: General:NAD, comfortable Heart:RRR, s1s2 nl Lungs:clear b/l, no crackle Abdomen:soft, Non-tender, non-distended Extremities: Bilateral leg wound chronic in nature, dressing applied on the right foot. Dialysis Access: Right IJ TDC in place.  [name] Gloriajean Okun 01/08/2022,2:52 PM  LOS: 7 days

## 2022-01-08 NOTE — Plan of Care (Signed)
  Problem: Education: Goal: Knowledge of General Education information will improve Description: Including pain rating scale, medication(s)/side effects and non-pharmacologic comfort measures Outcome: Progressing   Problem: Health Behavior/Discharge Planning: Goal: Ability to manage health-related needs will improve Outcome: Progressing   Problem: Clinical Measurements: Goal: Ability to maintain clinical measurements within normal limits will improve Outcome: Progressing   Problem: Activity: Goal: Risk for activity intolerance will decrease Outcome: Progressing   Problem: Nutrition: Goal: Adequate nutrition will be maintained Outcome: Progressing   Problem: Coping: Goal: Level of anxiety will decrease Outcome: Progressing   Problem: Safety: Goal: Ability to remain free from injury will improve Outcome: Progressing   

## 2022-01-09 LAB — ORGANISM ID, BACTERIA

## 2022-01-09 LAB — BACTERIAL ORGANISM REFLEX

## 2022-01-09 MED ORDER — CHLORHEXIDINE GLUCONATE CLOTH 2 % EX PADS
6.0000 | MEDICATED_PAD | Freq: Every day | CUTANEOUS | Status: DC
  Administered 2022-01-10: 6 via TOPICAL

## 2022-01-09 NOTE — Progress Notes (Signed)
[name] PROGRESS NOTE  Assessment/ Plan: Pt is a 56 y.o. yo female with a PMH of DM, obesity, HTN, CKD of unknown stage present with right foot infection seen as a consultation for worsening renal failure.  #New ESRD: Suspicious for chronic progressive CKD.  The kidney ultrasound with echogenic kidneys.  Stopped lisinopril, HCTZ and Lasix.  Started dialysis on 11/10 after placement of right IJ TDC by Dr. Scot Dock. Per VVS  plan for AV graft as outpatient. She prefers TTS is schedule and SW is following. Awaiting financial clearance now. Status post HD yesterday with 2.5 L ultrafiltration, tolerated well.  Social worker is following to arrange outpatient HD, now awaiting financial clearance.  Next HD tomorrow.  #Diabetic foot infection/PAD: It seems like the vascular is recommending arteriogram but patient is refusing.  On broad-spectrum antibiotics.  #Metabolic acidosis: now on HD.  DC oral sodium bicarbonate.  Monitor lab.  #Hypokalemia: Dialyzing with high potassium bath.  Liberalize potassium in her diet.  # Anemia: Iron saturation 32%. Continue Aranesp.  Received PRBC on 11/8.  # Secondary hyperparathyroidism: PTH 281.  Discontinue calcitriol.  Continue Renvela for hyperphosphatemia.  Monitor lab.  # HTN/volume: BP acceptable, continue UF with HD, off of amlodipine.  Monitor blood pressure.  #Dispo: Awaiting financial clearance from [facility] admission.  Ok to discharge from renal perspective once outpatient HD is confirmed.  Subjective: Seen and examined.  Tolerated HD well.  Denies nausea, vomiting, chest pain, shortness of breath.  Asking when she can go home.  Objective Vital signs in last 24 hours: Vitals:   01/08/22 1900 01/08/22 1930 01/08/22 1933 01/09/22 0827  BP: 112/85 (!) 158/82 (!) 160/86 138/72  Pulse: 90 90 92 88  Resp: 17 17 19    Temp:   98.2 F (36.8 C) 98.4 F (36.9 C)  TempSrc:    Oral  SpO2: 100% 99% 100% 100%  Weight:   105.9  kg   Height:       Weight change: 4.9 kg No intake or output data in the 24 hours ending 01/09/22 1123      Labs: RENAL PANEL Recent Labs    10/22/21 1125 12/06/21 1313 12/27/21 2015 01/01/22 0542 01/01/22 1834 01/02/22 0100 01/04/22 0336 01/04/22 0716 01/05/22 0931 01/06/22 1006 01/07/22 0935 01/08/22 0741  NA 147* 140 140 139  143  --  143 137 144 138  --  141 138  K 3.5 2.7* 2.8* 3.2*  3.2*  --  3.1* 2.7* 2.8* 3.4*  --  2.9* 3.2*  CL 111* 114* 113* 113*  118*  --  119* 111 113* 110  --  104 102  CO2 10* 10* 13* 10*  --  11* 13*  --  14*  --  22 24  GLUCOSE 72 108* 101* 96  93  --  79 85 71 116*  --  114* 148*  BUN 66* 52* 59* 68*  66*  --  61* 62* 58* 66*  --  50* 41*  CREATININE 9.26* 10.98* 10.38* 10.93*  12.50*  --  9.23* 10.39* 11.60* 10.08*  --  7.19* 5.37*  CALCIUM 5.8* 5.5* 5.5* 5.8*  --  5.4* 6.3*  --  6.6*  --  6.9* 7.4*  MG  --  1.2*  --   --   --   --   --   --   --   --   --   --   PHOS  --   --   --   --  7.8* 6.9* 7.3*  --  6.9*  --  4.0 3.5  ALBUMIN 3.5* 3.3* 3.0* 3.4*  --  2.2* 2.7*  --  2.8* 2.5* 2.5* 2.5*      Liver Function Tests: Recent Labs  Lab 01/06/22 1006 01/07/22 0935 01/08/22 0741  AST 13*  --   --   ALT 8  --   --   ALKPHOS 64  --   --   BILITOT 0.2*  --   --   PROT 6.1*  --   --   ALBUMIN 2.5* 2.5* 2.5*    No results for input(s): "LIPASE", "AMYLASE" in the last 168 hours. No results for input(s): "AMMONIA" in the last 168 hours. CBC: Recent Labs    01/01/22 1834 01/02/22 0100 01/03/22 0857 01/04/22 0716 01/05/22 0931 01/07/22 0935 01/08/22 0741  HGB  --    < > 9.6* 10.5* 8.8* 8.4* 8.8*  MCV  --    < > 87.8  --  89.0 88.8 91.4  FERRITIN 87  --   --   --   --   --   --   TIBC 220*  --   --   --   --   --   --   IRON 70  --   --   --   --   --   --    < > = values in this interval not displayed.     Cardiac Enzymes: Recent Labs  Lab 01/03/22 0857  CKTOTAL 172    CBG: Recent Labs  Lab 01/04/22 1142  01/04/22 1220 01/07/22 0753 01/07/22 1619  GLUCAP 76 104* 83 101*     Iron Studies: No results for input(s): "IRON", "TIBC", "TRANSFERRIN", "FERRITIN" in the last 72 hours. Studies/Results: No results found.  Medications: Infusions:  ceFEPime (MAXIPIME) IV 1 g (01/08/22 2213)    Scheduled Medications:  sodium chloride   Intravenous Once   amLODipine  5 mg Oral Daily   ascorbic acid  500 mg Oral Daily   Chlorhexidine Gluconate Cloth  6 each Topical Q0600   Chlorhexidine Gluconate Cloth  6 each Topical Q0600   collagenase   Topical Daily   darbepoetin (ARANESP) injection - DIALYSIS  60 mcg Subcutaneous Q Tue-HD   gentamicin ointment   Topical BID   heparin  5,000 Units Subcutaneous Q8H   metroNIDAZOLE  500 mg Oral Q12H   sevelamer carbonate  800 mg Oral TID WC    have reviewed scheduled and prn medications.  Physical Exam: General:NAD, comfortable Heart:RRR, s1s2 nl Lungs:clear b/l, no crackle Abdomen:soft, Non-tender, non-distended Extremities: Bilateral leg wound chronic in nature, dressing applied on the right foot. Dialysis Access: Right IJ TDC in place.  [name] 01/09/2022,11:23 AM  LOS: 8 days

## 2022-01-09 NOTE — Plan of Care (Signed)

## 2022-01-09 NOTE — Progress Notes (Signed)
Occupational Therapy Treatment Patient Details Name: [name] MRN: [id number] DOB: 1965/05/12 Today's Date: 01/09/2022   History of present illness Patient is a 56 y/o female who presents on 12/31/21 with a diabetic foot wound infection. MRI shows local cellulitis and no abscess/or osteo to her foot which is consistent with charcot's foot. She is s/p tunneled HD catheter placed 11/9 with HD starting 11/10. PMH includes DM, obesity, asthma, HTN, CKD stage V,   OT comments  Patient received in supine and able to get to EOB without assistance. Patient required assistance to donn new heel offloading boot. Patient ambulated to gym with RW and cues for PWB.  Patient educated on tub bench use to perform shower transfers and use of shower chair as alternative. Patient declined attempting transfer after demonstration stating she would not perform showers at home at this time, but will consider in future. Patient returned to room and positioned in recliner. Discharge recommendations continue to be appropriate and Acute OT to continue to follow.     Recommendations for follow up therapy are one component of a multi-disciplinary discharge planning process, led by the attending physician.  Recommendations may be updated based on patient status, additional functional criteria and insurance authorization.    Follow Up Recommendations  No OT follow up     Assistance Recommended at Discharge PRN  Patient can return home with the following  A little help with walking and/or transfers;Assistance with cooking/housework;Help with stairs or ramp for entrance;A little help with bathing/dressing/bathroom   Equipment Recommendations  None recommended by OT    Recommendations for Other Services      Precautions / Restrictions Precautions Precautions: Fall Required Braces or Orthoses: Other Brace Other Brace: RLE heel offloading shoe Restrictions Weight Bearing Restrictions: Yes RLE Weight Bearing:  Partial weight bearing RLE Partial Weight Bearing Percentage or Pounds: 50%       Mobility Bed Mobility Overal bed mobility: Modified Independent             General bed mobility comments: able to get to EOB without assistance    Transfers Overall transfer level: Needs assistance Equipment used: Rolling walker (2 wheels) Transfers: Sit to/from Stand Sit to Stand: Supervision     Step pivot transfers: Min guard, Supervision     General transfer comment: cues for safety and hand placement     Balance Overall balance assessment: Mild deficits observed, not formally tested                                         ADL either performed or assessed with clinical judgement   ADL Overall ADL's : Needs assistance/impaired                     Lower Body Dressing: Moderate assistance Lower Body Dressing Details (indicate cue type and reason): assistance to donn boot on RLE and patient able to donn sock on LLE Toilet Transfer: Min guard;Ambulation Toilet Transfer Details (indicate cue type and reason): simulated with recliner     Tub/ Shower Transfer: Tub bench;Shower Scientist, research (medical) Details (indicate cue type and reason): education on tub bench transfer and shower chair use. Patient declined attempting due to stating she won't be doing showers at home to avoid getting her catheter wet. Patient educated on hand held shower        Extremity/Trunk Assessment  Vision       Perception     Praxis      Cognition Arousal/Alertness: Awake/alert Behavior During Therapy: WFL for tasks assessed/performed Overall Cognitive Status: Impaired/Different from baseline Area of Impairment: Safety/judgement, Awareness, Memory                     Memory: Decreased recall of precautions   Safety/Judgement: Decreased awareness of safety Awareness: Emergent   General Comments: patient required education on PWB, she believed  she was weight bearing as tolerated        Exercises      Shoulder Instructions       General Comments      Pertinent Vitals/ Pain       Pain Assessment Pain Assessment: No/denies pain  Home Living                                          Prior Functioning/Environment              Frequency  Min 2X/week        Progress Toward Goals  OT Goals(current goals can now be found in the care plan section)  Progress towards OT goals: Progressing toward goals  Acute Rehab OT Goals Patient Stated Goal: go home OT Goal Formulation: With patient Time For Goal Achievement: 01/22/22 Potential to Achieve Goals: Good ADL Goals Pt Will Perform Lower Body Dressing: with modified independence;sitting/lateral leans;sit to/from stand Pt Will Transfer to Toilet: with modified independence;ambulating;regular height toilet Pt Will Perform Toileting - Clothing Manipulation and hygiene: with modified independence;sitting/lateral leans;sit to/from stand Pt Will Perform Tub/Shower Transfer: Tub transfer;with modified independence;ambulating;shower seat  Plan Discharge plan remains appropriate    Co-evaluation                 AM-PAC OT "6 Clicks" Daily Activity     Outcome Measure   Help from another person eating meals?: None Help from another person taking care of personal grooming?: A Little Help from another person toileting, which includes using toliet, bedpan, or urinal?: A Little Help from another person bathing (including washing, rinsing, drying)?: A Little Help from another person to put on and taking off regular upper body clothing?: None Help from another person to put on and taking off regular lower body clothing?: A Little 6 Click Score: 20    End of Session Equipment Utilized During Treatment: Gait belt;Rolling walker (2 wheels);Other (comment) (heel offloading boot)  OT Visit Diagnosis: Unsteadiness on feet (R26.81);Other abnormalities of  gait and mobility (R26.89)   Activity Tolerance Patient tolerated treatment well;No increased pain   Patient Left in chair;with call bell/phone within reach;with nursing/sitter in room   Nurse Communication Mobility status        Time: 9147-8295 OT Time Calculation (min): 40 min  Charges: OT General Charges $OT Visit: 1 Visit OT Treatments $Self Care/Home Management : 23-37 mins $Therapeutic Activity: 8-22 mins  Lodema Hong, [place]  Office [phone]   Trixie Dredge 01/09/2022, 1:45 PM

## 2022-01-09 NOTE — Progress Notes (Signed)
PROGRESS NOTE    [name]  HCW:[id number] DOB: Sep 27, 1965 DOA: 12/31/2021 PCP: [name], MD   Brief Narrative:  [name] is a 56 y.o. female with medical history significant of diabetes mellitus type 2, CKD stage V, and obesity who presents for worsening pain and a wound with foul smell of her right foot.    She has been seen in in the emergency department on 10/21 for the same and given imaging/findings at that time was appropriately sent home with clindamycin which she reports she completed. Wound cultures at the time noted Staphylococcus warneri, Streptococcus anginosis, and Staphylococcus species. X-rays of the right foot at intake noted to the plantar aspect of the foot with signs of bone involvement for which findings were consistent with chronic Charcot foot. She had MRI negative for osteo.  Podiatry evaluated patient and recommended wound care and IV antibiotics and follow-up as an outpatient.  Patient was also evaluated by vascular, will think arterial Doppler study showed biphasic Doppler signal in the posterior tibial and dorsalis pedis position.  Her circulation is likely adequate.  They would like to do arteriogram however patient has just started hemodialysis and would like to avoid IV contrast.  Unlikely CO2 administration will give adequate visualization of her tibial vessels.   Patient also presented with worsening CKD stage V now progressed to ESRD.  Patient was a started on hemodialysis.  Patient had insertion of tunneled dialysis catheter placed on 11/10. She was started on HD>   She was also found to have 1 out of 2 blood cultures positive for gram-negative rods. ID consulted. Recommend 2 weeks IV cefepime with HD> and oral flagyl.     Assessment & Plan:   Principal Problem:   Diabetic foot infection ([place]) Active Problems:   Chronic kidney disease (CKD), stage V ([place])   HTN (hypertension)   Type 2 diabetes mellitus with renal complication (H[facility])    Obesity (BMI 30-39.9)  Diabetic foot wound infection, failure of outpatient antibiotics Rule out bacteremia Patient does not meet sepsis criteria -Previously on clindamycin, reports completion antibiotics.  -Podiatry signed off -no indication for surgery based on imaging. -MRI confirms severe Charcot arthropathy -with notable cellulitis and no definitive abscess or draining tract. Osteomyelitis is less likely. -Continue cefepime - for gram negative rods blood culture 11/07 -ESR 46, CRP 1.9 -Cultures :  -Previous surgical cultures as above showed multiple strep and staph species -Blood culture show gram negative cocci preliminarily -ID consulted for antibiotics recommendations. They recommend 2 weeks Cefepime with HD and Flagyl BID>    CKD stage V - acutely worsening, progressing to ESRD Metabolic acidosis.  -Patient initially refused HD/access but now agreeable 11/9 -Vascular to place [facility] 11/10 -HD 11/10 and 11/11 - tolerating well -Nephro following -appreciate insight recommendations -needs clip.  Had first HD 11/11. -awaiting financial clearance.   Acute on chronic anemia likely of chronic disease -Likely secondary to acute illness and infection as above -Exacerbated by CKD stage V -monitor hb.   Hypocalcemia -Status post IV calcium gluconate, continue p.o. calcium -Continue to monitor and replace as needed -low albumin. Ca correction by albumin 8.2  Hypokalemia -Replete with HD   Metabolic acidosis with elevated anion gap -Secondary to uremia. -resolved with HD   Diarrhea -Improving, likely secondary to previous prolonged antibiotic course    Noncompliance  -Patient occasionally refusing labs, intervention, medical care making treatment difficult.  - She agreed to HD/access.  Essential hypertension -Blood pressures likely to improve with dialysis -Held  furosemide and lisinopril-hydrochlorothiazide due to kidney function -Hydralazine IV as needed   Controlled  diabetes mellitus type 2, without long-term use of insulin -Hemoglobin A1c was 6 on 12/21/2021 -Hypoglycemic protocol -Hold glipizide and metformin -Renal and carb modified diet -Consider placing on sensitive SSI if blood sugars noted to increase. -cbg 107  Obesity BMI 38.74 kg/m  DVT prophylaxis: Heparin Code Status: Full Family Communication: None present  Status is: Inpatient  Dispo: The patient is from: Home              Anticipated d/c is to: Home              Anticipated d/c date is: Pending above              Patient currently not medically stable for discharge  Consultants:  Podiatry, nephrology, vascular surgery  Procedures:  Pending above  Antimicrobials:  Cefepime, daptomycin, Flagyl  Subjective: She wanted to be leave AMA. Now will stay.   Objective: Vitals:   01/08/22 1830 01/08/22 1900 01/08/22 1930 01/08/22 1933  BP: (!) 151/83 112/85 (!) 158/82 (!) 160/86  Pulse: 91 90 90 92  Resp: _0 Temp:    98.2 F (36.8 C)  TempSrc:      SpO2: 100% 100% 99% 100%  Weight:    105.9 kg  Height:       No intake or output data in the 24 hours ending 01/09/22 0749  Filed Weights   01/07/22 1345 01/08/22 1515 01/08/22 1933  Weight: 102.9 kg 107.8 kg 105.9 kg    Examination:  General:  NAD Lungs: CTA Heart: S 1, S 2 RRR Abdomen: BS present, soft, nt Extremities: Right foot wound, POA   Data Reviewed: I have personally reviewed following labs and imaging studies  CBC: Recent Labs  Lab 01/03/22 0857 01/04/22 0716 01/05/22 0931 01/07/22 0935 01/08/22 0741  WBC 5.8  --  6.1 6.4 7.3  HGB 9.6* 10.5* 8.8* 8.4* 8.8*  HCT 28.7* 31.0* 27.5* 25.3* 27.5*  MCV 87.8  --  89.0 88.8 91.4  PLT 230  --  262 222 423    Basic Metabolic Panel: Recent Labs  Lab 01/04/22 0336 01/04/22 0716 01/05/22 0931 01/07/22 0935 01/08/22 0741  NA 137 144 138 141 138  K 2.7* 2.8* 3.4* 2.9* 3.2*  CL 111 113* 110 104 102  CO2 13*  --  14* 22 24  GLUCOSE 85  71 116* 114* 148*  BUN 62* 58* 66* 50* 41*  CREATININE 10.39* 11.60* 10.08* 7.19* 5.37*  CALCIUM 6.3*  --  6.6* 6.9* 7.4*  PHOS 7.3*  --  6.9* 4.0 3.5    GFR: Estimated Creatinine Clearance: 14.7 mL/min (A) (by C-G formula based on SCr of 5.37 mg/dL (H)). Liver Function Tests: Recent Labs  Lab 01/04/22 0336 01/05/22 0931 01/06/22 1006 01/07/22 0935 01/08/22 0741  AST  --   --  13*  --   --   ALT  --   --  8  --   --   ALKPHOS  --   --  64  --   --   BILITOT  --   --  0.2*  --   --   PROT  --   --  6.1*  --   --   ALBUMIN 2.7* 2.8* 2.5* 2.5* 2.5*    No results for input(s): "LIPASE", "AMYLASE" in the last 168 hours. No results for input(s): "AMMONIA" in the last 168 hours. Coagulation Profile:  No results for input(s): "INR", "PROTIME" in the last 168 hours. Cardiac Enzymes: Recent Labs  Lab 01/03/22 0857  CKTOTAL 172    BNP (last 3 results) No results for input(s): "PROBNP" in the last 8760 hours. HbA1C: No results for input(s): "HGBA1C" in the last 72 hours. CBG: Recent Labs  Lab 01/04/22 1142 01/04/22 1220 01/07/22 0753 01/07/22 1619  GLUCAP 76 104* 83 101*    Lipid Profile: No results for input(s): "CHOL", "HDL", "LDLCALC", "TRIG", "CHOLHDL", "LDLDIRECT" in the last 72 hours. Thyroid Function Tests: No results for input(s): "TSH", "T4TOTAL", "FREET4", "T3FREE", "THYROIDAB" in the last 72 hours. Anemia Panel: No results for input(s): "VITAMINB12", "FOLATE", "FERRITIN", "TIBC", "IRON", "RETICCTPCT" in the last 72 hours.  Sepsis Labs: No results for input(s): "PROCALCITON", "LATICACIDVEN" in the last 168 hours.  Recent Results (from the past 240 hour(s))  Blood culture (routine x 2)     Status: None   Collection Time: 01/01/22  5:44 AM   Specimen: BLOOD  Result Value Ref Range Status   Specimen Description BLOOD BLOOD LEFT WRIST  Final   Special Requests   Final    BOTTLES DRAWN AEROBIC AND ANAEROBIC Blood Culture results may not be optimal due to an  inadequate volume of blood received in culture bottles   Culture   Final    NO GROWTH 5 DAYS Performed at [facility], [place] [address], [place], [place] [address]    Report Status 01/06/2022 FINAL  Final  Blood culture (routine x 2)     Status: None (Preliminary result)   Collection Time: 01/01/22  6:58 AM   Specimen: BLOOD RIGHT FOREARM  Result Value Ref Range Status   Specimen Description BLOOD RIGHT FOREARM  Final   Special Requests   Final    BOTTLES DRAWN AEROBIC AND ANAEROBIC Blood Culture results may not be optimal due to an inadequate volume of blood received in culture bottles   Culture  Setup Time   Final    GRAM NEGATIVE RODS ANAEROBIC BOTTLE ONLY CRITICAL RESULT CALLED TO, READ BACK BY AND VERIFIED WITH: PHARMD J.FRENS AT 1133 ON 01/02/2022 BY T.SAAD.    Culture   Final    GRAM NEGATIVE RODS SENT TO [facility] FOR IDENTIFICATION AND SUSCEPTIBILITY TESTING. Performed at [facility], [place] [address], [place], [place] [address]    Report Status PENDING  Incomplete  Blood Culture ID Panel (Reflexed)     Status: None   Collection Time: 01/01/22  6:58 AM  Result Value Ref Range Status   Enterococcus faecalis NOT DETECTED NOT DETECTED Final   Enterococcus Faecium NOT DETECTED NOT DETECTED Final   Listeria monocytogenes NOT DETECTED NOT DETECTED Final   Staphylococcus species NOT DETECTED NOT DETECTED Final   Staphylococcus aureus (BCID) NOT DETECTED NOT DETECTED Final   Staphylococcus epidermidis NOT DETECTED NOT DETECTED Final   Staphylococcus lugdunensis NOT DETECTED NOT DETECTED Final   Streptococcus species NOT DETECTED NOT DETECTED Final   Streptococcus agalactiae NOT DETECTED NOT DETECTED Final   Streptococcus pneumoniae NOT DETECTED NOT DETECTED Final   Streptococcus pyogenes NOT DETECTED NOT DETECTED Final   A.calcoaceticus-baumannii NOT DETECTED NOT DETECTED Final   Bacteroides fragilis NOT DETECTED NOT DETECTED Final   Enterobacterales NOT DETECTED NOT  DETECTED Final   Enterobacter cloacae complex NOT DETECTED NOT DETECTED Final   Escherichia coli NOT DETECTED NOT DETECTED Final   Klebsiella aerogenes NOT DETECTED NOT DETECTED Final   Klebsiella oxytoca NOT DETECTED NOT DETECTED Final   Klebsiella  pneumoniae NOT DETECTED NOT DETECTED Final   Proteus species NOT DETECTED NOT DETECTED Final   Salmonella species NOT DETECTED NOT DETECTED Final   Serratia marcescens NOT DETECTED NOT DETECTED Final   Haemophilus influenzae NOT DETECTED NOT DETECTED Final   Neisseria meningitidis NOT DETECTED NOT DETECTED Final   Pseudomonas aeruginosa NOT DETECTED NOT DETECTED Final   Stenotrophomonas maltophilia NOT DETECTED NOT DETECTED Final   Candida albicans NOT DETECTED NOT DETECTED Final   Candida auris NOT DETECTED NOT DETECTED Final   Candida glabrata NOT DETECTED NOT DETECTED Final   Candida krusei NOT DETECTED NOT DETECTED Final   Candida parapsilosis NOT DETECTED NOT DETECTED Final   Candida tropicalis NOT DETECTED NOT DETECTED Final   Cryptococcus neoformans/gattii NOT DETECTED NOT DETECTED Final    Comment: Performed at [facility], [place] [address] [place], [place] [address]  Surgical pcr screen     Status: None   Collection Time: 01/04/22  6:39 AM   Specimen: Nasal Mucosa; Nasal Swab  Result Value Ref Range Status   MRSA, PCR NEGATIVE NEGATIVE Final   Staphylococcus aureus NEGATIVE NEGATIVE Final    Comment: (NOTE) The Xpert SA Assay (FDA approved for NASAL specimens in patients 31 years of age and older), is one component of a comprehensive surveillance program. It is not intended to diagnose infection nor to guide or monitor treatment. Performed at [facility], [place] [address], [place], [place] [address]      Radiology Studies: No results found.  Scheduled Meds:  sodium chloride   Intravenous Once   amLODipine  5 mg Oral Daily   ascorbic acid  500 mg Oral Daily   Chlorhexidine Gluconate Cloth  6 each Topical  Q0600   Chlorhexidine Gluconate Cloth  6 each Topical Q0600   collagenase   Topical Daily   darbepoetin (ARANESP) injection - DIALYSIS  60 mcg Subcutaneous Q Tue-HD   gentamicin ointment   Topical BID   heparin  5,000 Units Subcutaneous Q8H   metroNIDAZOLE  500 mg Oral Q12H   sevelamer carbonate  800 mg Oral TID WC   Continuous Infusions:  ceFEPime (MAXIPIME) IV 1 g (01/08/22 2213)     LOS: 8 days   Time spent: 45mn  Deaisa Merida A Donta Fuster, MD [facility]  If 7PM-7AM, please contact night-coverage [email or web address]  01/09/2022, 7:49 AM

## 2022-01-09 NOTE — Progress Notes (Signed)
Pt has refused multiple medications, see MAR, lab draws and 0400 vital signs.  Dressing changed after pt returned from HD, tolerated well.

## 2022-01-09 NOTE — Plan of Care (Signed)

## 2022-01-09 NOTE — Progress Notes (Signed)
Contacted [facility] admissions to request an update on financial clearance for pt. Awaiting response.   Melven Sartorius Renal Navigator [phone]

## 2022-01-10 LAB — RENAL FUNCTION PANEL
Albumin: 2.5 g/dL — ABNORMAL LOW (ref 3.5–5.0)
Anion gap: 9 (ref 5–15)
BUN: 50 mg/dL — ABNORMAL HIGH (ref 6–20)
CO2: 25 mmol/L (ref 22–32)
Calcium: 8.2 mg/dL — ABNORMAL LOW (ref 8.9–10.3)
Chloride: 99 mmol/L (ref 98–111)
Creatinine, Ser: 5.26 mg/dL — ABNORMAL HIGH (ref 0.44–1.00)
GFR, Estimated: 9 mL/min — ABNORMAL LOW (ref >60)
Glucose, Bld: 124 mg/dL — ABNORMAL HIGH (ref 70–99)
Phosphorus: 4.4 mg/dL (ref 2.5–4.6)
Potassium: 3.5 mmol/L (ref 3.5–5.1)
Sodium: 133 mmol/L — ABNORMAL LOW (ref 135–145)

## 2022-01-10 LAB — CBC
HCT: 24.4 % — ABNORMAL LOW (ref 36.0–46.0)
Hemoglobin: 8.1 g/dL — ABNORMAL LOW (ref 12.0–15.0)
MCH: 29.8 pg (ref 26.0–34.0)
MCHC: 33.2 g/dL (ref 30.0–36.0)
MCV: 89.7 fL (ref 80.0–100.0)
Platelets: 227 10*3/uL (ref 150–400)
RBC: 2.72 MIL/uL — ABNORMAL LOW (ref 3.87–5.11)
RDW: 15 % (ref 11.5–15.5)
WBC: 7.7 10*3/uL (ref 4.0–10.5)
nRBC: 0 % (ref 0.0–0.2)

## 2022-01-10 MED ORDER — SEVELAMER CARBONATE 800 MG PO TABS: 800.0000 mg | ORAL_TABLET | Freq: Three times a day (TID) | ORAL | 0 refills | Status: AC

## 2022-01-10 MED ORDER — HEPARIN SODIUM (PORCINE) 1000 UNIT/ML IJ SOLN
INTRAMUSCULAR | Status: AC
  Filled 2022-01-10: qty 4

## 2022-01-10 MED ORDER — SODIUM CHLORIDE 0.9 % IV SOLN: 1.0000 g | INTRAVENOUS | 0 refills | Status: AC

## 2022-01-10 MED ORDER — AMLODIPINE BESYLATE 5 MG PO TABS: 5.0000 mg | ORAL_TABLET | Freq: Every day | ORAL | 1 refills | Status: AC

## 2022-01-10 MED ORDER — METRONIDAZOLE 500 MG PO TABS: 500.0000 mg | ORAL_TABLET | Freq: Two times a day (BID) | ORAL | 0 refills | Status: AC

## 2022-01-10 NOTE — Progress Notes (Signed)
Received patient in bed to unit.  Alert and oriented.  Informed consent signed and in chart.   Treatment initiated: [place] Treatment completed: 1225  Patient tolerated well.  Transported back to the room  Alert, without acute distress.  Hand-off given to patient's nurse.   Access used: Catheter  Access issues: none  Total UF removed: 2.5 Medication(s) given: none Post HD VS: 134/74,84,24,97.7 Post HD weight: 104.0   [facility]

## 2022-01-10 NOTE — Progress Notes (Signed)
Pt rt foot wound changed.  Foot cleansed.  Ointment applied. Wrapped with wet gauze and dry gauze around it.

## 2022-01-10 NOTE — Procedures (Signed)
Patient was seen on dialysis and the procedure was supervised.  BFR 400  Via TDC BP is  132/70.   Patient appears to be tolerating treatment well  [name] [name] 01/10/2022

## 2022-01-10 NOTE — Discharge Summary (Signed)
Physician Discharge Summary   Patient: [name] MRN: [id number] DOB: [birth year]  Admit date:     12/31/2021  Discharge date: 01/10/22  Discharge Physician: Elmarie Shiley   PCP: [name], MD   Recommendations at discharge:   Needs to complete Cefepime with HD until 11/20. Needs close follow up with podiatry and vascular.    Discharge Diagnoses: Principal Problem:   Diabetic foot infection ([place]) Active Problems:   Chronic kidney disease (CKD), stage V ([place])   HTN (hypertension)   Type 2 diabetes mellitus with renal complication (HCC)   Obesity (BMI 30-39.9)  Resolved Problems:   * No resolved hospital problems. *  Hospital Course: [name] is a 57 y.o. female with medical history significant of diabetes mellitus type 2, CKD stage V, and obesity who presents for worsening pain and a wound with foul smell of her right foot.     She has been seen in in the emergency department on 10/21 for the same and given imaging/findings at that time was appropriately sent home with clindamycin which she reports she completed. Wound cultures at the time noted Staphylococcus warneri, Streptococcus anginosis, and Staphylococcus species. X-rays of the right foot at intake noted to the plantar aspect of the foot with signs of bone involvement for which findings were consistent with chronic Charcot foot. She had MRI negative for osteo.  Podiatry evaluated patient and recommended wound care and IV antibiotics and follow-up as an outpatient.  Patient was also evaluated by vascular, will think arterial Doppler study showed biphasic Doppler signal in the posterior tibial and dorsalis pedis position.  Her circulation is likely adequate.  They would like to do arteriogram however patient has just started hemodialysis and would like to avoid IV contrast.  Unlikely CO2 administration will give adequate visualization of her tibial vessels.    Patient also presented with worsening CKD stage V  now progressed to ESRD.  Patient was a started on hemodialysis.  Patient had insertion of tunneled dialysis catheter placed on 11/10. She was started on HD>    She was also found to have 1 out of 2 blood cultures positive for gram-negative rods. ID consulted. Recommend 2 weeks IV cefepime with HD> and oral flagyl.       Assessment and Plan: 1-Diabetic foot wound infection, failure of outpatient antibiotics Patient does not meet sepsis criteria -Previously on clindamycin, reports completion antibiotics.  -Podiatry signed off -no indication for surgery based on imaging. -MRI confirms severe Charcot arthropathy -with notable cellulitis and no definitive abscess or draining tract. Osteomyelitis is less likely. -Continue cefepime - for gram negative rods blood culture 11/07 -ESR 46, CRP 1.9 -Cultures :  -Previous surgical cultures as above showed multiple strep and staph species -Blood culture show gram negative cocci preliminarily -ID consulted for antibiotics recommendations. They recommend 2 weeks Cefepime with HD and Flagyl BID>    Gram Negative Rods Bacteremia; culture pending, ID recommend 2 weeks antibiotics until 11/20. Nephrology will arrange antibiotics with HD.   CKD stage V - acutely worsening, progressing to ESRD Metabolic acidosis.  -Patient initially refused HD/access but now agreeable 11/9 -Vascular to place [facility] 11/10 -HD 11/10 and 11/11 - tolerating well -Nephro following -appreciate insight recommendations -needs clip.  Had first HD 11/11. She had financial clearance, HD out patient has been arrange.    Acute on chronic anemia likely of chronic disease -Likely secondary to acute illness and infection as above -Exacerbated by CKD stage V -monitor hb.  Hypocalcemia -Status post IV calcium gluconate, continue p.o. calcium -Continue to monitor and replace as needed -low albumin. Ca correction by albumin 8.2   Hypokalemia -Replete with HD   Metabolic acidosis  with elevated anion gap -Secondary to uremia. -Resolved with HD   Diarrhea -Improving, likely secondary to previous prolonged antibiotic course     Noncompliance  -Patient occasionally refusing labs, intervention, medical care making treatment difficult.  - She agreed to HD/access.   Essential hypertension -Blood pressures likely to improve with dialysis -Held furosemide and lisinopril-hydrochlorothiazide due to kidney function -Hydralazine IV as needed  started on Norvasc.   Controlled diabetes mellitus type 2, without long-term use of insulin -Hemoglobin A1c was 6 on 12/21/2021 -Hypoglycemic protocol -Hold glipizide and metformin -Renal and carb modified diet -Consider placing on sensitive SSI if blood sugars noted to increase. -cbg 107   Obesity BMI 38.74 kg/m        Consultants: nephrology, Vascular.  Procedures performed: None Disposition: Home Diet recommendation:  Discharge Diet Orders (From admission, onward)     Start     Ordered   01/10/22 0000  Diet - low sodium heart healthy        01/10/22 1537           Carb modified diet DISCHARGE MEDICATION: Allergies as of 01/10/2022       Reactions   Penicillins Rash        Medication List     STOP taking these medications    furosemide 20 MG tablet Commonly known as: LASIX   glipiZIDE 10 MG 24 hr tablet Commonly known as: glipiZIDE XL   ibuprofen 600 MG tablet Commonly known as: ADVIL   lisinopril-hydrochlorothiazide 20-25 MG tablet Commonly known as: ZESTORETIC   metFORMIN 850 MG tablet Commonly known as: GLUCOPHAGE   trimethoprim-polymyxin b ophthalmic solution Commonly known as: Polytrim       TAKE these medications    albuterol 108 (90 Base) MCG/ACT inhaler Commonly known as: VENTOLIN HFA Inhale 1-2 puffs into the lungs every 6 (six) hours as needed for wheezing or shortness of breath. What changed: when to take this   amLODipine 5 MG tablet Commonly known as:  NORVASC Take 1 tablet (5 mg total) by mouth daily. Start taking on: January 11, 2022   ceFEPIme 1 g in sodium chloride 0.9 % 100 mL Inject 1 g into the vein daily. With Hemodialysis until 11/20.   fluticasone-salmeterol 500-50 MCG/ACT Aepb Commonly known as: ADVAIR Inhale 1 puff into the lungs in the morning and at bedtime.   glucose blood test strip Commonly known as: True Metrix Blood Glucose Test Use to check blood sugar three times per day   metroNIDAZOLE 500 MG tablet Commonly known as: FLAGYL Take 1 tablet (500 mg total) by mouth every 12 (twelve) hours for 9 days.   sevelamer carbonate 800 MG tablet Commonly known as: RENVELA Take 1 tablet (800 mg total) by mouth 3 (three) times daily with meals.   True Metrix Meter w/Device Kit Use to check blood sugar 3 times daily   TRUEplus Lancets 28G Misc Use to check blood sugar 3 times daily               Discharge Care Instructions  (From admission, onward)           Start     Ordered   01/10/22 0000  Discharge wound care:       Comments: See above   01/10/22 1537  Follow-up Information     [name], MD.   Specialty: Family Medicine Contact information: [address] [place] [address] [phone]                Discharge Exam: Danley Danker Weights   01/07/22 1345 01/08/22 1515 01/08/22 1933  Weight: 102.9 kg 107.8 kg 105.9 kg   General; NAD  Condition at discharge: stable  The results of significant diagnostics from this hospitalization (including imaging, microbiology, ancillary and laboratory) are listed below for reference.   Imaging Studies: DG Chest Port 1 View  Result Date: 01/04/2022 CLINICAL DATA:  End-stage renal disease. Dialysis catheter placement. EXAM: PORTABLE CHEST 1 VIEW COMPARISON:  Chest radiograph dated Jul 14, 2021 FINDINGS: The heart is enlarged. Elevation of the right hemidiaphragm, unchanged. Lungs are otherwise clear without  evidence of focal consolidation or pleural effusion. Right IJ double-lumen dialysis catheter with distal tip in the SVC. No acute osseous abnormality. IMPRESSION: 1. Right IJ double-lumen dialysis catheter with distal tip in the SVC. 2. Cardiomegaly. Electronically Signed   By: Keane Police D.O.   On: 01/04/2022 12:03   DG C-Arm 1-60 Min-No Report  Result Date: 01/04/2022 Fluoroscopy was utilized by the requesting physician.  No radiographic interpretation.   VAS [place] UPPER EXT VEIN MAPPING (PRE-OP AVF)  Result Date: 01/02/2022 UPPER EXTREMITY VEIN MAPPING Patient Name:  WILLOW SHIDLER  Date of Exam:   01/02/2022 Medical Rec #: [id number]        Accession #:    [id number] Date of Birth: 09-Jun-1965        Patient Gender: F Patient Age:   73 years Exam Location:  [facility] Procedure:      VAS [place] UPPER EXT VEIN MAPPING (PRE-OP AVF) Referring Phys: Otelia Santee --------------------------------------------------------------------------------  Indications: Pre-access. Limitations: IV/Bandage to BUE Comparison Study: No previous exams Performing Technologist: Jody Hill RVT, RDMS  Examination Guidelines: A complete evaluation includes B-mode imaging, spectral Doppler, color Doppler, and power Doppler as needed of all accessible portions of each vessel. Bilateral testing is considered an integral part of a complete examination. Limited examinations for reoccurring indications may be performed as noted. +-----------------+-------------+----------+----------------------+ Right Cephalic   Diameter (cm)Depth (cm)       Findings        +-----------------+-------------+----------+----------------------+ Shoulder             0.24        0.25                          +-----------------+-------------+----------+----------------------+ Prox upper arm       0.23        0.85                          +-----------------+-------------+----------+----------------------+ Mid upper arm        0.18        0.80                           +-----------------+-------------+----------+----------------------+ Dist upper arm       0.10        0.62                          +-----------------+-------------+----------+----------------------+ Antecubital fossa    0.16        0.29                          +-----------------+-------------+----------+----------------------+  Prox forearm         0.34        0.47   branching and thrombus +-----------------+-------------+----------+----------------------+ Mid forearm                             not visualized and IV  +-----------------+-------------+----------+----------------------+ Dist forearm                            not visualized and IV  +-----------------+-------------+----------+----------------------+ Wrist                                       not visualized     +-----------------+-------------+----------+----------------------+ +-----------------+-------------+----------+---------+ Right Basilic    Diameter (cm)Depth (cm)Findings  +-----------------+-------------+----------+---------+ Prox upper arm       0.50                         +-----------------+-------------+----------+---------+ Mid upper arm        0.29                         +-----------------+-------------+----------+---------+ Dist upper arm       0.36                         +-----------------+-------------+----------+---------+ Antecubital fossa    0.41               branching +-----------------+-------------+----------+---------+ Prox forearm         0.16                         +-----------------+-------------+----------+---------+ Mid forearm          0.14                         +-----------------+-------------+----------+---------+ Distal forearm       0.14                         +-----------------+-------------+----------+---------+ Elbow                0.10                          +-----------------+-------------+----------+---------+ +-----------------+-------------+----------+---------------------+ Left Cephalic    Diameter (cm)Depth (cm)      Findings        +-----------------+-------------+----------+---------------------+ Shoulder             0.32        1.30                         +-----------------+-------------+----------+---------------------+ Prox upper arm       0.28        1.26                         +-----------------+-------------+----------+---------------------+ Mid upper arm        0.21        0.75                         +-----------------+-------------+----------+---------------------+ Dist upper arm       0.16  0.76         branching       +-----------------+-------------+----------+---------------------+ Antecubital fossa    0.17        0.48                         +-----------------+-------------+----------+---------------------+ Prox forearm                            not visualized and IV +-----------------+-------------+----------+---------------------+ Mid forearm          0.18        0.77                         +-----------------+-------------+----------+---------------------+ Dist forearm         0.16        0.38                         +-----------------+-------------+----------+---------------------+ Wrist                0.11        0.43                         +-----------------+-------------+----------+---------------------+ +-----------------+-------------+----------+---------------------+ Left Basilic     Diameter (cm)Depth (cm)      Findings        +-----------------+-------------+----------+---------------------+ Prox upper arm       0.61                                     +-----------------+-------------+----------+---------------------+ Mid upper arm        0.23                                     +-----------------+-------------+----------+---------------------+ Dist upper arm        0.22                                     +-----------------+-------------+----------+---------------------+ Antecubital fossa    0.16                     branching       +-----------------+-------------+----------+---------------------+ Prox forearm                            not visualized and IV +-----------------+-------------+----------+---------------------+ Mid forearm          0.08                                     +-----------------+-------------+----------+---------------------+ Distal forearm       0.08                                     +-----------------+-------------+----------+---------------------+ Wrist                0.07                                     +-----------------+-------------+----------+---------------------+ *  See table(s) above for measurements and observations.  Diagnosing physician: Jamelle Haring Electronically signed by Jamelle Haring on 01/02/2022 at 3:53:57 PM.    Final    VAS [place] ABI WITH/WO TBI  Result Date: 01/02/2022  LOWER EXTREMITY DOPPLER STUDY Patient Name:  LOULA MARCELLA  Date of Exam:   01/02/2022 Medical Rec #: [id number]        Accession #:    [id number] Date of Birth: 08/17/1965        Patient Gender: F Patient Age:   59 years Exam Location:  [facility] Procedure:      VAS [place] ABI WITH/WO TBI Referring Phys: Fuller Plan --------------------------------------------------------------------------------  Indications: DM foot infection RLE High Risk Factors: Hypertension, Diabetes, past history of smoking. Other Factors: CKDV.  Comparison Study: No previous Performing Technologist: Hill, Jody RVT, RDMS  Examination Guidelines: A complete evaluation includes at minimum, Doppler waveform signals and systolic blood pressure reading at the level of bilateral brachial, anterior tibial, and posterior tibial arteries, when vessel segments are accessible. Bilateral testing is considered an integral part of a complete examination.  Photoelectric Plethysmograph (PPG) waveforms and toe systolic pressure readings are included as required and additional duplex testing as needed. Limited examinations for reoccurring indications may be performed as noted.  ABI Findings: +---------+------------------+-----+---------+--------+ Right    Rt Pressure (mmHg)IndexWaveform Comment  +---------+------------------+-----+---------+--------+ Brachial 169                    triphasic         +---------+------------------+-----+---------+--------+ PTA                             biphasic >255 [place]  +---------+------------------+-----+---------+--------+ DP                              biphasic >255 [place]  +---------+------------------+-----+---------+--------+ Great Toe                       Abnormal >255 [place]  +---------+------------------+-----+---------+--------+ +---------+------------------+-----+---------+-------+ Left     Lt Pressure (mmHg)IndexWaveform Comment +---------+------------------+-----+---------+-------+ Brachial 172                    triphasic        +---------+------------------+-----+---------+-------+ PTA                             biphasic >255 [place] +---------+------------------+-----+---------+-------+ DP                              biphasic >255 [place] +---------+------------------+-----+---------+-------+ Great Toe                       Abnormal >255 [place] +---------+------------------+-----+---------+-------+ +-------+-----------+-----------+------------+------------+ ABI/TBIToday's ABIToday's TBIPrevious ABIPrevious TBI +-------+-----------+-----------+------------+------------+ Right  [place]         [place]                                  +-------+-----------+-----------+------------+------------+ Left   [place]                                           +-------+-----------+-----------+------------+------------+ Arterial wall calcification  precludes accurate ankle pressures and  ABIs.  Summary: Right: Resting right ankle-brachial index and toe-brachial index indicates noncompressible right lower extremity arteries. Left: Resting left ankle-brachial index and toe-brachial index indicates noncompressible left lower extremity arteries. *See table(s) above for measurements and observations.  Electronically signed by Jamelle Haring on 01/02/2022 at 3:53:11 PM.    Final    MR HEEL RIGHT WO CONTRAST  Result Date: 01/01/2022 CLINICAL DATA:  Charcot foot, plantar wound EXAM: MR OF THE RIGHT HEEL WITHOUT CONTRAST TECHNIQUE: Multiplanar, multisequence MR imaging of the ankle was performed. No intravenous contrast was administered. COMPARISON:  03/11/2018 FINDINGS: TENDONS Peroneal: Peroneus longus tendinopathy, spur like abutment of the cuboid along the peroneus longus. Posteromedial: Flexor hallucis longus tenosynovitis. Accessory navicular noted. Anterior: Unremarkable Achilles: Unremarkable Plantar Fascia: Mild thickening of the medial band of the plantar fascia on image 13 series 8, cannot exclude mild plantar fasciitis. LIGAMENTS Lateral: Discontinuous ATFL compatible with ATFL tear/insufficiency. Thickened calcaneofibular ligament. Medial: Mildly irregular/disorganized deep tibiotalar component of the deltoid ligament. Thickened tibiospring and tibionavicular components. Thickened superomedial portion of the spring ligament. CARTILAGE Ankle Joint: 5 mm osteochondral lesion of the posterior talar dome on image 9 series 3 with associated surrounding marrow edema, similar to previous. Subtalar Joints/Sinus Tarsi: Markedly severe erosive and degenerative arthropathy in the anterior talar dome, talar neck, and talar head, with increased involvement and fragmentation compared to the 03/11/2018 exam. Degenerative arthropathy anteriorly along the subtalar joints. Bones: Severe Charcot arthropathy particularly at the Chopart joint also involving the midfoot, with worsened edema and heterogeneous  lesions in the talar head and neck with some associated volume loss; substantial erosions and fragments along the adjacent navicular which is compressed and narrowed laterally; and irregularity in arthropathy proximally along the cuneiforms and cuboid. Findings are progressive from 03/11/2018 although not dramatically so. The Lisfranc ligament still appears to be intact on image 23 series 9, with no malalignment at the Lisfranc joint. Both the talar head and L2 anterior calcaneus are inferiorly displaced and angulated with respect to the midfoot. Other: Plantar cutaneous defect below the lower margin of the calcaneus as on image 24 series 8. No underlying abscess or definite draining tract is seen in this vicinity although there is some confluence of low T1 signal as on image 10 series 4 which may reflect local cellulitis. There is also circumferential edema along the distal calf and tracking into the dorsum of the foot in the subcutaneous tissues, cellulitis is not excluded. IMPRESSION: 1. 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 2. Plantar cutaneous defect/wound below the lower anterior margin of the calcaneus, with some confluence of low T1 signal which may reflect local cellulitis. No underlying abscess or definite draining tract is identified. Accordingly, the severe arthropathy is thought to most likely represent a continuation of Charcot arthropathy rather than osteomyelitis and septic arthritis. 3. Discontinuous ATFL compatible with ATFL tear/insufficiency. Thickened calcaneofibular ligament, deltoid ligament, and superomedial portion of the spring ligament. 4. Flexor hallucis longus tenosynovitis. 5. Peroneus longus tendinopathy. 6. Mild thickening of the medial band of the plantar fascia, cannot exclude mild plantar fasciitis. 7. 5 mm osteochondral lesion of the posterior talar dome, similar to previous. 8. Circumferential  subcutaneous edema along the distal calf and tracking into the dorsum of the foot, cellulitis is not excluded. Electronically Signed   By: Van Clines M.D.   On: 01/01/2022 14:31   DG Foot Complete Right  Result Date: 12/31/2021 CLINICAL  DATA:  Fall smelling wound. EXAM: RIGHT FOOT COMPLETE - 3+ VIEW COMPARISON:  Radiograph 12/15/2021 FINDINGS: Plantar wound at the level of the midfoot. No deep soft tissue gas extension. No radiopaque foreign body. This appears slightly larger than on prior exam. There is no evidence of subjacent osteomyelitis, no erosions or periostitis. Midfoot changes consistent with Charcot foot. No fracture. IMPRESSION: 1. Plantar wound at the level of the midfoot. No deep soft tissue gas extension. No radiopaque foreign body. 2. No radiographic findings of osteomyelitis. 3. Findings consistent with chronic Charcot foot. Electronically Signed   By: Keith Rake M.D.   On: 12/31/2021 21:24   US Renal  Result Date: 12/27/2021 CLINICAL DATA:  Renal failure. EXAM: RENAL / URINARY TRACT ULTRASOUND COMPLETE COMPARISON:  CT abdomen pelvis dated 12/06/2021. FINDINGS: Right Kidney: Renal measurements: 10.2 x 4.2 x 3.8 cm = volume: 86 mL. There is increased renal parenchymal echogenicity. No hydronephrosis or shadowing stone. Left Kidney: Renal measurements: 11.3 x 5.1 x 3.9 cm = volume: 120 mL. There is increased for renal parenchymal echogenicity. No hydronephrosis or shadowing stone. Bladder: Appears normal for degree of bladder distention. Other: There is a moderate right pleural effusion. IMPRESSION: 1. Echogenic kidneys in keeping with chronic kidney disease. No hydronephrosis or shadowing stone. 2. Right pleural effusion. Electronically Signed   By: Anner Crete M.D.   On: 12/27/2021 23:00   DG Foot Complete Right  Result Date: 12/15/2021 CLINICAL DATA:  Right foot pain and swelling with wound. EXAM: RIGHT FOOT COMPLETE - 3+ VIEW COMPARISON:  November 05, 2017.   March 11, 2018. FINDINGS: There is again noted extensive bony destruction and fragmentation involving the midfoot most consistent with neuropathic arthropathy. Vascular calcifications are noted. No definite acute fracture is noted. No definite lytic destruction is seen. Gas is seen in the plantar soft tissues consistent with ulceration or wound. 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 Collection of gas is seen in the plantar soft tissues consistent with ulceration or wound. No definite lytic destruction or acute fracture is noted. Electronically Signed   By: Marijo Conception M.D.   On: 12/15/2021 16:24    Microbiology: Results for orders placed or performed during the hospital encounter of 12/31/21  Blood culture (routine x 2)     Status: None   Collection Time: 01/01/22  5:44 AM   Specimen: BLOOD  Result Value Ref Range Status   Specimen Description BLOOD BLOOD LEFT WRIST  Final   Special Requests   Final    BOTTLES DRAWN AEROBIC AND ANAEROBIC Blood Culture results may not be optimal due to an inadequate volume of blood received in culture bottles   Culture   Final    NO GROWTH 5 DAYS Performed at [facility], [place] [address], [place], [place] [address]    Report Status 01/06/2022 FINAL  Final  Blood culture (routine x 2)     Status: None (Preliminary result)   Collection Time: 01/01/22  6:58 AM   Specimen: BLOOD RIGHT FOREARM  Result Value Ref Range Status   Specimen Description BLOOD RIGHT FOREARM  Final   Special Requests   Final    BOTTLES DRAWN AEROBIC AND ANAEROBIC Blood Culture results may not be optimal due to an inadequate volume of blood received in culture bottles   Culture  Setup Time   Final    GRAM NEGATIVE RODS ANAEROBIC BOTTLE ONLY CRITICAL RESULT CALLED TO, READ BACK BY AND VERIFIED WITH:  PHARMD J.FRENS AT 1133 ON 01/02/2022 BY T.SAAD.    Culture   Final    GRAM NEGATIVE RODS SENT TO [facility] FOR  IDENTIFICATION AND SUSCEPTIBILITY TESTING. Performed at [facility], [place] [address] [place], [place] [address]    Report Status PENDING  Incomplete  Blood Culture ID Panel (Reflexed)     Status: None   Collection Time: 01/01/22  6:58 AM  Result Value Ref Range Status   Enterococcus faecalis NOT DETECTED NOT DETECTED Final   Enterococcus Faecium NOT DETECTED NOT DETECTED Final   Listeria monocytogenes NOT DETECTED NOT DETECTED Final   Staphylococcus species NOT DETECTED NOT DETECTED Final   Staphylococcus aureus (BCID) NOT DETECTED NOT DETECTED Final   Staphylococcus epidermidis NOT DETECTED NOT DETECTED Final   Staphylococcus lugdunensis NOT DETECTED NOT DETECTED Final   Streptococcus species NOT DETECTED NOT DETECTED Final   Streptococcus agalactiae NOT DETECTED NOT DETECTED Final   Streptococcus pneumoniae NOT DETECTED NOT DETECTED Final   Streptococcus pyogenes NOT DETECTED NOT DETECTED Final   A.calcoaceticus-baumannii NOT DETECTED NOT DETECTED Final   Bacteroides fragilis NOT DETECTED NOT DETECTED Final   Enterobacterales NOT DETECTED NOT DETECTED Final   Enterobacter cloacae complex NOT DETECTED NOT DETECTED Final   Escherichia coli NOT DETECTED NOT DETECTED Final   Klebsiella aerogenes NOT DETECTED NOT DETECTED Final   Klebsiella oxytoca NOT DETECTED NOT DETECTED Final   Klebsiella pneumoniae NOT DETECTED NOT DETECTED Final   Proteus species NOT DETECTED NOT DETECTED Final   Salmonella species NOT DETECTED NOT DETECTED Final   Serratia marcescens NOT DETECTED NOT DETECTED Final   Haemophilus influenzae NOT DETECTED NOT DETECTED Final   Neisseria meningitidis NOT DETECTED NOT DETECTED Final   Pseudomonas aeruginosa NOT DETECTED NOT DETECTED Final   Stenotrophomonas maltophilia NOT DETECTED NOT DETECTED Final   Candida albicans NOT DETECTED NOT DETECTED Final   Candida auris NOT DETECTED NOT DETECTED Final   Candida glabrata NOT DETECTED NOT DETECTED Final   Candida  krusei NOT DETECTED NOT DETECTED Final   Candida parapsilosis NOT DETECTED NOT DETECTED Final   Candida tropicalis NOT DETECTED NOT DETECTED Final   Cryptococcus neoformans/gattii NOT DETECTED NOT DETECTED Final    Comment: Performed at [facility], [place] [address], [place], [place] [address]  Organism ID, Bacteria     Status: None   Collection Time: 01/01/22  6:58 AM  Result Value Ref Range Status   Organism ID, Bacteria REFERT  Final    Comment: (NOTE) Please refer to the following specimen for additional lab results. Please refer to spec # [id number] for test result. Barton Fanny notified 01-09-2022 at 13:00 Performed At: [facility] [place], [place] [id number] Rush Farmer MD QT:[id number]    Source of Sample BLOOD FOR ID AND SUSCEPTIBILITY [id number]  Final    Comment: Performed at [facility], [place] [address] [place], [place] [address]  Bacterial organism reflex     Status: None   Collection Time: 01/01/22  6:58 AM  Result Value Ref Range Status   Bacterial result 1 Comment  Final    Comment: (NOTE) Specimen has been received and testing has been initiated. Performed At: [facility] [place], [place] [id number] Rush Farmer MD WI:[id number]   Surgical pcr screen     Status: None   Collection Time: 01/04/22  6:39 AM   Specimen: Nasal Mucosa; Nasal Swab  Result Value Ref Range Status   MRSA, PCR NEGATIVE NEGATIVE Final  Staphylococcus aureus NEGATIVE NEGATIVE Final    Comment: (NOTE) The Xpert SA Assay (FDA approved for NASAL specimens in patients 64 years of age and older), is one component of a comprehensive surveillance program. It is not intended to diagnose infection nor to guide or monitor treatment. Performed at [facility], [place] [address] [place], [place] [address]     Labs: CBC: Recent Labs  Lab 01/04/22 [phone] 01/05/22 0931 01/07/22 0935 01/08/22 0741 01/10/22 0849  WBC  --  6.1 6.4  7.3 7.7  HGB 10.5* 8.8* 8.4* 8.8* 8.1*  HCT 31.0* 27.5* 25.3* 27.5* 24.4*  MCV  --  89.0 88.8 91.4 89.7  PLT  --  262 222 207 718   Basic Metabolic Panel: Recent Labs  Lab 01/04/22 0336 01/04/22 0716 01/05/22 0931 01/07/22 0935 01/08/22 0741 01/10/22 0849  NA 137 144 138 141 138 133*  K 2.7* 2.8* 3.4* 2.9* 3.2* 3.5  CL 111 113* 110 104 102 99  CO2 13*  --  14* _0 GLUCOSE 85 71 116* 114* 148* 124*  BUN 62* 58* 66* 50* 41* 50*  CREATININE 10.39* 11.60* 10.08* 7.19* 5.37* 5.26*  CALCIUM 6.3*  --  6.6* 6.9* 7.4* 8.2*  PHOS 7.3*  --  6.9* 4.0 3.5 4.4   Liver Function Tests: Recent Labs  Lab 01/05/22 0931 01/06/22 1006 01/07/22 0935 01/08/22 0741 01/10/22 0849  AST  --  13*  --   --   --   ALT  --  8  --   --   --   ALKPHOS  --  64  --   --   --   BILITOT  --  0.2*  --   --   --   PROT  --  6.1*  --   --   --   ALBUMIN 2.8* 2.5* 2.5* 2.5* 2.5*   CBG: Recent Labs  Lab 01/04/22 1142 01/04/22 1220 01/07/22 0753 01/07/22 1619  GLUCAP 76 104* 83 101*    Discharge time spent: greater than 30 minutes.  Signed: Elmarie Shiley, MD [facility] 01/10/2022

## 2022-01-10 NOTE — Progress Notes (Addendum)
Contacted [facility] admissions this morning to request update on medical clearance and schedule for pt at d/c. Awaiting response. Pt has been financially cleared for out-pt HD.   Melven Sartorius Renal Navigator [phone]  Addendum at 3:47 pm: Pt has been accepted at [facility]) on TTS 12:20 chair time. Pt can start on Saturday if pt comes to clinic tomorrow at 3:00 to complete paperwork. Met with pt at bedside to discuss arrangements. Pt provided schedule letter and agreeable to plan. Contacted treatment team via secure char to provide update. Out-pt HD arrangements to be added to pt's AVS. Contacted renal NP regarding clinic's need for orders. Clinic advised pt will require iv cefepime with HD and renal PA to send orders. Clinic aware pt will d/c today and complete paperwork tomorrow in order to start on Saturday.   Melven Sartorius Renal Navigator [phone]

## 2022-01-10 NOTE — Progress Notes (Signed)
[name] NEPHROLOGY PROGRESS NOTE  Assessment/ Plan: Pt is a 56 y.o. yo female with a PMH of DM, obesity, HTN, CKD of unknown stage present with right foot infection seen as a consultation for worsening renal failure.  #New ESRD: Suspicious for chronic progressive CKD.  The kidney ultrasound with echogenic kidneys.  Stopped lisinopril, HCTZ and Lasix.  Started dialysis on 11/10 after placement of right IJ TDC by Dr. Scot Dock. Per [facility]VS  plan for AV graft as outpatient. She prefers TTS is schedule and SW is following.  It seems like the patient is financially cleared for outpatient HD now awaiting for the schedule. HD today, tolerating well.  #Diabetic foot infection/PAD: It seems like the vascular is recommending arteriogram but patient is refusing.  On broad-spectrum antibiotics.  #Metabolic acidosis: now on HD.  DC oral sodium bicarbonate.  Monitor lab.  #Hypokalemia: Dialyzing with high potassium bath.  Liberalize potassium in her diet.  # Anemia: Iron saturation 32%. Continue Aranesp.  Received PRBC on 11/8.  # Secondary hyperparathyroidism: PTH 281.  Discontinue calcitriol.  Continue Renvela for hyperphosphatemia.  Monitor lab.  # HTN/volume: BP acceptable, continue UF with HD, off of amlodipine.  Monitor blood pressure.  #Dispo: Okay to discharge when outpatient HD schedule is confirmed.  Renal navigator is following.  Subjective: Seen and examined.  Tolerating HD well.  Denies nausea, vomiting, chest pain, shortness of breath.  Objective Vital signs in last 24 hours: Vitals:   01/10/22 0835 01/10/22 0849 01/10/22 0950 01/10/22 1030  BP: 138/69 131/69  132/70  Pulse: 89 86 85   Resp: 20 20    Temp: 97.9 F (36.6 C)     TempSrc: Oral     SpO2:      Weight:      Height:       Weight change:   Intake/Output Summary (Last 24 hours) at 01/10/2022 1054 Last data filed at 01/09/2022 2050 Gross per 24 hour  Intake 240 ml  Output --  Net 240 ml         Labs: RENAL PANEL Recent Labs    10/22/21 1125 12/06/21 1313 12/27/21 2015 01/01/22 0542 01/01/22 1834 01/02/22 0100 01/04/22 0336 01/04/22 0716 01/05/22 0931 01/06/22 1006 01/07/22 0935 01/08/22 0741 01/10/22 0849  NA 147* 140 140 139  143  --  143 137 144 138  --  141 138 133*  K 3.5 2.7* 2.8* 3.2*  3.2*  --  3.1* 2.7* 2.8* 3.4*  --  2.9* 3.2* 3.5  CL 111* 114* 113* 113*  118*  --  119* 111 113* 110  --  104 102 99  CO2 10* 10* 13* 10*  --  11* 13*  --  14*  --  22 24 25   GLUCOSE 72 108* 101* 96  93  --  79 85 71 116*  --  114* 148* 124*  BUN 66* 52* 59* 68*  66*  --  61* 62* 58* 66*  --  50* 41* 50*  CREATININE 9.26* 10.98* 10.38* 10.93*  12.50*  --  9.23* 10.39* 11.60* 10.08*  --  7.19* 5.37* 5.26*  CALCIUM 5.8* 5.5* 5.5* 5.8*  --  5.4* 6.3*  --  6.6*  --  6.9* 7.4* 8.2*  MG  --  1.2*  --   --   --   --   --   --   --   --   --   --   --   PHOS  --   --   --   --  7.8* 6.9* 7.3*  --  6.9*  --  4.0 3.5 4.4  ALBUMIN 3.5* 3.3* 3.0* 3.4*  --  2.2* 2.7*  --  2.8* 2.5* 2.5* 2.5* 2.5*      Liver Function Tests: Recent Labs  Lab 01/06/22 1006 01/07/22 0935 01/08/22 0741 01/10/22 0849  AST 13*  --   --   --   ALT 8  --   --   --   ALKPHOS 64  --   --   --   BILITOT 0.2*  --   --   --   PROT 6.1*  --   --   --   ALBUMIN 2.5* 2.5* 2.5* 2.5*    No results for input(s): "LIPASE", "AMYLASE" in the last 168 hours. No results for input(s): "AMMONIA" in the last 168 hours. CBC: Recent Labs    01/01/22 1834 01/02/22 0100 01/04/22 0716 01/05/22 0931 01/07/22 0935 01/08/22 0741 01/10/22 0849  HGB  --    < > 10.5* 8.8* 8.4* 8.8* 8.1*  MCV  --    < >  --  89.0 88.8 91.4 89.7  FERRITIN 87  --   --   --   --   --   --   TIBC 220*  --   --   --   --   --   --   IRON 70  --   --   --   --   --   --    < > = values in this interval not displayed.     Cardiac Enzymes: No results for input(s): "CKTOTAL", "CKMB", "CKMBINDEX", "TROPONINI" in the last 168  hours.  CBG: Recent Labs  Lab 01/04/22 1142 01/04/22 1220 01/07/22 0753 01/07/22 1619  GLUCAP 76 104* 83 101*     Iron Studies: No results for input(s): "IRON", "TIBC", "TRANSFERRIN", "FERRITIN" in the last 72 hours. Studies/Results: No results found.  Medications: Infusions:  ceFEPime (MAXIPIME) IV 1 g (01/09/22 1820)    Scheduled Medications:  sodium chloride   Intravenous Once   amLODipine  5 mg Oral Daily   ascorbic acid  500 mg Oral Daily   Chlorhexidine Gluconate Cloth  6 each Topical Q0600   collagenase   Topical Daily   darbepoetin (ARANESP) injection - DIALYSIS  60 mcg Subcutaneous Q Tue-HD   gentamicin ointment   Topical BID   heparin  5,000 Units Subcutaneous Q8H   metroNIDAZOLE  500 mg Oral Q12H   sevelamer carbonate  800 mg Oral TID WC    have reviewed scheduled and prn medications.  Physical Exam: General:NAD, comfortable Heart:RRR, s1s2 nl Lungs:clear b/l, no crackle Abdomen:soft, Non-tender, non-distended Extremities: Bilateral leg wound chronic in nature, dressing applied on the right foot. Dialysis Access: Right IJ TDC in place.  Bellany Elbaum Prasad Lena Gores 01/10/2022,10:54 AM  LOS: 9 days

## 2022-01-10 NOTE — Progress Notes (Signed)
PT Cancellation Note  Patient Details Name: [name] MRN: [id number] DOB: 1965-12-07   Cancelled Treatment:    Reason Eval/Treat Not Completed: Patient at procedure or test/unavailable. Pt off unit for HD. Will check back as schedule allows to continue with PT POC.    Thelma Comp 01/10/2022, 10:58 AM  Rolinda Roan, PT, DPT Acute Rehabilitation Services Secure Chat Preferred Office: [phone]

## 2022-01-10 NOTE — TOC Transition Note (Signed)
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 Per MD patient ready for DC today .RN, patient, and patient's family aware of DC. Pt states daughter to assist with care once d/c. Pt without RX med concerns. Pt declined W/C, BSC. Referral made with [employer]  for RW. RW will be delivered to bedside prior to d/c. Daughter to provide transportation to home. Pt states without transportation issue to HD sessions ( [facility]) on TTS 12:20 chair time) and provider appointments.Post hospital f/u noted on AVS.   RNCM will sign off for now as intervention is no longer needed. Please consult [place] again if new needs arise.    Final next level of care: Home/Self Care Barriers to Discharge: No Barriers Identified   Patient Goals and CMS Choice     Choice offered to / list presented to : Patient  Discharge Placement                       Discharge Plan and Services                DME Arranged: Walker rolling DME Agency: [employer] Date DME Agency Contacted: 01/10/22 Time DME Agency Contacted: [phone] Representative spoke with at DME Agency: Jodell Cipro            Social Determinants of Health ([place]) Interventions Food Insecurity Interventions: Inpatient TOC   Readmission Risk Interventions     No data to display

## 2022-01-11 ENCOUNTER — Telehealth: Payer: Self-pay | Admitting: Nephrology

## 2022-01-11 DIAGNOSIS — N186 End stage renal disease: Secondary | ICD-10-CM | POA: Insufficient documentation

## 2022-01-11 DIAGNOSIS — L299 Pruritus, unspecified: Secondary | ICD-10-CM | POA: Insufficient documentation

## 2022-01-11 DIAGNOSIS — R509 Fever, unspecified: Secondary | ICD-10-CM | POA: Insufficient documentation

## 2022-01-11 DIAGNOSIS — R52 Pain, unspecified: Secondary | ICD-10-CM | POA: Insufficient documentation

## 2022-01-11 DIAGNOSIS — T829XXA Unspecified complication of cardiac and vascular prosthetic device, implant and graft, initial encounter: Secondary | ICD-10-CM | POA: Insufficient documentation

## 2022-01-11 DIAGNOSIS — Z8631 Personal history of diabetic foot ulcer: Secondary | ICD-10-CM | POA: Insufficient documentation

## 2022-01-11 DIAGNOSIS — D631 Anemia in chronic kidney disease: Secondary | ICD-10-CM | POA: Insufficient documentation

## 2022-01-11 NOTE — Telephone Encounter (Signed)
Transition of care contact from inpatient facility  Date of discharge: 01/10/22 Date of contact: 01/11/22 Method: Phone Spoke to: Patient  Patient contacted to discuss transition of care from recent inpatient hospitalization. Patient was admitted to [facility] from 11/6 -01/10/22 with discharge diagnosis of diabetic foot wound infection and ESRD starting HD.  Medication changes were reviewed.  Patient will follow up with his/her outpatient HD unit on: Saturday 11/18

## 2022-01-14 ENCOUNTER — Ambulatory Visit (INDEPENDENT_AMBULATORY_CARE_PROVIDER_SITE_OTHER): Payer: Self-pay | Admitting: Podiatry

## 2022-01-14 DIAGNOSIS — Z111 Encounter for screening for respiratory tuberculosis: Secondary | ICD-10-CM | POA: Insufficient documentation

## 2022-01-14 DIAGNOSIS — Z91199 Patient's noncompliance with other medical treatment and regimen due to unspecified reason: Secondary | ICD-10-CM

## 2022-01-14 NOTE — Progress Notes (Signed)
Patient was no-show for appointment today 

## 2022-01-15 ENCOUNTER — Ambulatory Visit (INDEPENDENT_AMBULATORY_CARE_PROVIDER_SITE_OTHER): Payer: Self-pay | Admitting: Podiatry

## 2022-01-15 ENCOUNTER — Encounter: Payer: Self-pay | Admitting: Podiatry

## 2022-01-15 DIAGNOSIS — Z91199 Patient's noncompliance with other medical treatment and regimen due to unspecified reason: Secondary | ICD-10-CM

## 2022-01-15 NOTE — Progress Notes (Signed)
Patient was no-show for appointment today 

## 2022-01-21 ENCOUNTER — Telehealth: Payer: Self-pay | Admitting: Podiatry

## 2022-01-21 NOTE — Telephone Encounter (Signed)
Pt called about her appt that was scheduled. She was in the hospital and her insurance got canceled.  Upon checking per Dr Sherryle Lis pt has no showed a few times and it was said not to r/s and pt should follow up with the wound care center. I gave pt this information and she said she has gotten in touch with the financial assistance thru [employer] to help

## 2022-01-22 DIAGNOSIS — D689 Coagulation defect, unspecified: Secondary | ICD-10-CM | POA: Insufficient documentation

## 2022-01-31 ENCOUNTER — Ambulatory Visit: Payer: Commercial Managed Care - HMO | Admitting: Family Medicine

## 2022-02-01 ENCOUNTER — Ambulatory Visit: Payer: Commercial Managed Care - HMO | Admitting: Family Medicine

## 2022-02-01 ENCOUNTER — Ambulatory Visit (INDEPENDENT_AMBULATORY_CARE_PROVIDER_SITE_OTHER): Payer: Self-pay | Admitting: Family Medicine

## 2022-02-01 VITALS — BP 148/87 | HR 88 | Temp 98.2°F | Resp 16 | Wt 213.0 lb

## 2022-02-01 DIAGNOSIS — N184 Chronic kidney disease, stage 4 (severe): Secondary | ICD-10-CM

## 2022-02-01 DIAGNOSIS — E1122 Type 2 diabetes mellitus with diabetic chronic kidney disease: Secondary | ICD-10-CM

## 2022-02-01 DIAGNOSIS — I10 Essential (primary) hypertension: Secondary | ICD-10-CM

## 2022-02-01 DIAGNOSIS — Z09 Encounter for follow-up examination after completed treatment for conditions other than malignant neoplasm: Secondary | ICD-10-CM

## 2022-02-01 DIAGNOSIS — N19 Unspecified kidney failure: Secondary | ICD-10-CM

## 2022-02-04 ENCOUNTER — Encounter: Payer: Self-pay | Admitting: Family Medicine

## 2022-02-04 LAB — AEROBIC BACTERIA, ID BY SEQ.

## 2022-02-04 NOTE — Progress Notes (Signed)
Established Patient Office Visit  Subjective    Patient ID: [name], female    DOB: 08/24/1965  Age: 56 y.o. MRN: [id number]  CC:  Chief Complaint  Patient presents with   Diabetes    HPI [name] presents for hospital discharge follow up where she was admitted with dx of CKD. During the admission she was started on dialysis. She reports improvement since she has started dialysis. She denies acute complaints.    Outpatient Encounter Medications as of 02/01/2022  Medication Sig   albuterol (VENTOLIN HFA) 108 (90 Base) MCG/ACT inhaler Inhale 1-2 puffs into the lungs every 6 (six) hours as needed for wheezing or shortness of breath. (Patient taking differently: Inhale 1-2 puffs into the lungs daily as needed for wheezing or shortness of breath.)   amLODipine (NORVASC) 5 MG tablet Take 1 tablet (5 mg total) by mouth daily.   Blood Glucose Monitoring Suppl (TRUE METRIX METER) w/Device KIT Use to check blood sugar 3 times daily   ceFEPIme 1 g in sodium chloride 0.9 % 100 mL Inject 1 g into the vein daily. With Hemodialysis until 11/20.   fluticasone-salmeterol (ADVAIR) 500-50 MCG/ACT AEPB Inhale 1 puff into the lungs in the morning and at bedtime.   glucose blood (TRUE METRIX BLOOD GLUCOSE TEST) test strip Use to check blood sugar three times per day   sevelamer carbonate (RENVELA) 800 MG tablet Take 1 tablet (800 mg total) by mouth 3 (three) times daily with meals.   TRUEPLUS LANCETS 28G MISC Use to check blood sugar 3 times daily   No facility-administered encounter medications on file as of 02/01/2022.    Past Medical History:  Diagnosis Date   Asthma    Child   Diabetes mellitus    Obesity     Past Surgical History:  Procedure Laterality Date   CHOLECYSTECTOMY     INSERTION OF DIALYSIS CATHETER Right 01/04/2022   Procedure: INSERTION OF TUNNELED DIALYSIS CATHETER;  Surgeon: Angelia Mould, MD;  Location: [facility] OR;  Service: Vascular;  Laterality: Right;    TUBAL LIGATION      Family History  Problem Relation Age of Onset   Diabetes Mother     Social History   Socioeconomic History   Marital status: Divorced    Spouse name: Not on file   Number of children: Not on file   Years of education: Not on file   Highest education level: Not on file  Occupational History   Not on file  Tobacco Use   Smoking status: Former    Packs/day: 0.00    Types: Cigarettes   Smokeless tobacco: Never   Tobacco comments:    Smoked when she was a teenager.   Vaping Use   Vaping Use: Never used  Substance and Sexual Activity   Alcohol use: Yes    Comment: occasionally   Drug use: No   Sexual activity: Yes  Other Topics Concern   Not on file  Social History Narrative   Lives with 3 children in [place]. Does not work    Scientist, physiological Strain: Not on file  Food Insecurity: Food Insecurity Present (01/02/2022)   Hunger Vital Sign    Worried About Running Out of Food in the Last Year: Sometimes true    Ran Out of Food in the Last Year: Sometimes true  Transportation Needs: No Transportation Needs (01/02/2022)   PRAPARE - Hydrologist (Medical): No  Lack of Transportation (Non-Medical): No  Physical Activity: Not on file  Stress: Not on file  Social Connections: Not on file  Intimate Partner Violence: Not At Risk (01/02/2022)   Humiliation, Afraid, Rape, and Kick questionnaire    Fear of Current or Ex-Partner: No    Emotionally Abused: No    Physically Abused: No    Sexually Abused: No    Review of Systems  All other systems reviewed and are negative.       Objective    BP (!) 148/87 (BP Location: Right Arm, Patient Position: Sitting, Cuff Size: Large)   Pulse 88   Temp 98.2 F (36.8 C) (Oral)   Resp 16   Wt 213 lb (96.6 kg)   LMP 05/26/2017   SpO2 97%   BMI 34.38 kg/m   Physical Exam Vitals and nursing note reviewed.  Constitutional:      General: She  is not in acute distress. Cardiovascular:     Rate and Rhythm: Normal rate and regular rhythm.  Pulmonary:     Effort: Pulmonary effort is normal.     Breath sounds: Normal breath sounds. No wheezing.  Abdominal:     Palpations: Abdomen is soft.     Tenderness: There is no abdominal tenderness.  Musculoskeletal:     Right lower leg: No edema.     Left lower leg: No edema.  Neurological:     General: No focal deficit present.     Mental Status: She is alert and oriented to person, place, and time.         Assessment & Plan:   1. Hospital discharge follow-up Improved.   2. Type 2 diabetes mellitus with stage 4 chronic kidney disease, without long-term current use of insulin ([place]) continue  3. Renal failure, unspecified chronicity Continue dialysis as per consultant.   4. Primary hypertension Will monitor since start of dialysis  No follow-ups on file.   Natalie Sax, MD

## 2022-02-05 LAB — SUSCEPTIBILITY, GRAM POS RODS

## 2022-02-06 LAB — CULTURE, BLOOD (ROUTINE X 2)

## 2022-03-29 ENCOUNTER — Other Ambulatory Visit: Payer: Self-pay | Admitting: *Deleted

## 2022-03-29 DIAGNOSIS — N186 End stage renal disease: Secondary | ICD-10-CM

## 2022-04-08 ENCOUNTER — Ambulatory Visit (INDEPENDENT_AMBULATORY_CARE_PROVIDER_SITE_OTHER)
Admission: RE | Admit: 2022-04-08 | Discharge: 2022-04-08 | Disposition: A | Discharge status: Home / Self Care | Payer: Medicare Other | Source: Ambulatory Visit | Attending: Surgery | Admitting: Surgery

## 2022-04-08 ENCOUNTER — Ambulatory Visit (HOSPITAL_COMMUNITY)
Admission: RE | Admit: 2022-04-08 | Discharge: 2022-04-08 | Disposition: A | Discharge status: Home / Self Care | Payer: Medicare Other | Source: Ambulatory Visit | Attending: Surgery | Admitting: Surgery

## 2022-04-08 DIAGNOSIS — N186 End stage renal disease: Secondary | ICD-10-CM | POA: Diagnosis present

## 2022-04-10 ENCOUNTER — Encounter: Payer: Self-pay | Admitting: Vascular Surgery

## 2022-04-10 ENCOUNTER — Ambulatory Visit (INDEPENDENT_AMBULATORY_CARE_PROVIDER_SITE_OTHER): Payer: Self-pay | Admitting: Vascular Surgery

## 2022-04-10 VITALS — BP 139/85 | HR 77 | Temp 98.0°F | Resp 20 | Ht 66.0 in | Wt 216.0 lb

## 2022-04-10 DIAGNOSIS — N186 End stage renal disease: Secondary | ICD-10-CM

## 2022-04-10 NOTE — Progress Notes (Signed)
Patient ID: [name], female   DOB: [birth year], 57 y.o.   MRN: [id number]  Reason for Consult: New Patient (Initial Visit)   Referred by [name], MD  Subjective:     HPI:  [name] is a 57 y.o. female originally from [place].  Now on dialysis via right IJ tunneled catheter.  She is left-hand dominant.  She denies any history of upper extremity, chest or breast surgery.  She is not on any blood thinners.  She had a catheter placed in the hospital at the time there was active infection and so no permanent access was placed.  Past Medical History:  Diagnosis Date   Asthma    Child   Chronic kidney disease    Diabetes mellitus    Obesity    Family History  Problem Relation Age of Onset   Diabetes Mother    Past Surgical History:  Procedure Laterality Date   CHOLECYSTECTOMY     INSERTION OF DIALYSIS CATHETER Right 01/04/2022   Procedure: INSERTION OF TUNNELED DIALYSIS CATHETER;  Surgeon: Angelia Mould, MD;  Location: [facility] OR;  Service: Vascular;  Laterality: Right;   TUBAL LIGATION      Short Social History:  Social History   Tobacco Use   Smoking status: Former    Packs/day: 0.00    Types: Cigarettes   Smokeless tobacco: Never   Tobacco comments:    Smoked when she was a teenager.   Substance Use Topics   Alcohol use: Yes    Comment: occasionally    Allergies  Allergen Reactions   Penicillins Rash    Current Outpatient Medications  Medication Sig Dispense Refill   albuterol (VENTOLIN HFA) 108 (90 Base) MCG/ACT inhaler Inhale 1-2 puffs into the lungs every 6 (six) hours as needed for wheezing or shortness of breath. (Patient taking differently: Inhale 1-2 puffs into the lungs daily as needed for wheezing or shortness of breath.) 18 g 0   amLODipine (NORVASC) 5 MG tablet Take 1 tablet (5 mg total) by mouth daily. 30 tablet 1   Blood Glucose Monitoring Suppl (TRUE METRIX METER) w/Device KIT Use to check blood sugar 3 times  daily 1 kit 0   ceFEPIme 1 g in sodium chloride 0.9 % 100 mL Inject 1 g into the vein daily. With Hemodialysis until 11/20. 1 ampule 0   fluticasone-salmeterol (ADVAIR) 500-50 MCG/ACT AEPB Inhale 1 puff into the lungs in the morning and at bedtime. 1 each 2   glucose blood (TRUE METRIX BLOOD GLUCOSE TEST) test strip Use to check blood sugar three times per day 100 each 12   sevelamer carbonate (RENVELA) 800 MG tablet Take 1 tablet (800 mg total) by mouth 3 (three) times daily with meals. 30 tablet 0   TRUEPLUS LANCETS 28G MISC Use to check blood sugar 3 times daily 100 each 11   No current facility-administered medications for this visit.    Review of Systems  Constitutional:  Constitutional negative. HENT: HENT negative.  Eyes: Eyes negative.  Respiratory: Respiratory negative.  Cardiovascular: Cardiovascular negative.  GI: Gastrointestinal negative.  Musculoskeletal: Musculoskeletal negative.  Skin: Skin negative.  Neurological: Neurological negative. Hematologic: Hematologic/lymphatic negative.  Psychiatric: Psychiatric negative.        Objective:  Objective   Vitals:   04/10/22 1103  BP: 139/85  Pulse: 77  Resp: 20  Temp: 98 F (36.7 C)  SpO2: 100%  Weight: 216 lb (98 kg)  Height: 5' 6"$  (1.676 m)   Body mass  index is 34.86 kg/m.  Physical Exam HENT:     Head: Normocephalic.     Nose: Nose normal.  Eyes:     Pupils: Pupils are equal, round, and reactive to light.  Cardiovascular:     Rate and Rhythm: Normal rate.     Pulses:          Radial pulses are 2+ on the right side and 2+ on the left side.  Pulmonary:     Effort: Pulmonary effort is normal.  Abdominal:     General: Abdomen is flat.  Musculoskeletal:        General: Normal range of motion.     Right lower leg: No edema.     Left lower leg: No edema.  Skin:    General: Skin is warm.     Capillary Refill: Capillary refill takes less than 2 seconds.  Neurological:     General: No focal deficit  present.     Mental Status: She is alert.  Psychiatric:        Mood and Affect: Mood normal.        Behavior: Behavior normal.        Thought Content: Thought content normal.        Judgment: Judgment normal.     Data: Right Pre-Dialysis Findings:  +----------------------------+---------+-------------------+----------+----  ----+  Location                   PSV      Intralum. Diam.    Waveform   Comments                             (cm/s)   (cm)                                    +----------------------------+---------+-------------------+----------+----  ----+  Radial artery distal upper  72       0.3                triphasic            arm                                                                          +----------------------------+---------+-------------------+----------+----  ----+  Ulnar artery distal upper   78       0.4                triphasic            arm                                                                          +----------------------------+---------+-------------------+----------+----  ----+  Radial Art at Wrist         58       0.16  triphasic            +----------------------------+---------+-------------------+----------+----  ----+  Ulnar Art at Wrist          41       0.14               triphasic            +----------------------------+---------+-------------------+----------+----  ----+       Left Pre-Dialysis Findings:  +-----------------------+----------+--------------------+---------+--------  +  Location              PSV (cm/s)Intralum. Diam. (cm)Waveform  Comments  +-----------------------+----------+--------------------+---------+--------  +  Brachial Antecub. fossa94        0.44                triphasic           +-----------------------+----------+--------------------+---------+--------  +  Radial Art at Wrist    85        0.17                 triphasic           +-----------------------+----------+--------------------+---------+--------  +  Ulnar Art at Wrist     42        0.13                triphasic           +-----------------------+----------+--------------------+---------+--------  +        Summary:    Right: Bifurcation in the proximal upper arm. Patent radial and         ulnar arteries.  Left: Patent brachial, radial, and ulnar arteries.   Right Cephalic   Diameter (cm)Depth (cm)   Findings     +-----------------+-------------+----------+--------------+  Shoulder            0.24                               +-----------------+-------------+----------+--------------+  Prox upper arm       0.27                               +-----------------+-------------+----------+--------------+  Mid upper arm        0.12                               +-----------------+-------------+----------+--------------+  Dist upper arm                          not visualized  +-----------------+-------------+----------+--------------+  Antecubital fossa                       not visualized  +-----------------+-------------+----------+--------------+  Prox forearm         0.20                               +-----------------+-------------+----------+--------------+  Mid forearm          0.19                               +-----------------+-------------+----------+--------------+  Dist forearm         0.16                               +-----------------+-------------+----------+--------------+   +-----------------+-------------+----------+--------+  Right Basilic    Diameter (cm)Depth (cm)Findings  +-----------------+-------------+----------+--------+  Mid upper arm        0.26                         +-----------------+-------------+----------+--------+  Dist upper arm       0.26                          +-----------------+-------------+----------+--------+  Antecubital fossa    0.21                         +-----------------+-------------+----------+--------+   +-----------------+-------------+----------+--------------+  Left Cephalic    Diameter (cm)Depth (cm)   Findings     +-----------------+-------------+----------+--------------+  Shoulder            0.27                               +-----------------+-------------+----------+--------------+  Prox upper arm       0.25                               +-----------------+-------------+----------+--------------+  Mid upper arm        0.16                               +-----------------+-------------+----------+--------------+  Dist upper arm       0.13                               +-----------------+-------------+----------+--------------+  Antecubital fossa                       not visualized  +-----------------+-------------+----------+--------------+  Prox forearm         0.19                               +-----------------+-------------+----------+--------------+  Mid forearm          0.12                               +-----------------+-------------+----------+--------------+  Dist forearm         0.14                               +-----------------+-------------+----------+--------------+   +-----------------+-------------+----------+--------+  Left Basilic     Diameter (cm)Depth (cm)Findings  +-----------------+-------------+----------+--------+  Dist upper arm       0.25                         +-----------------+-------------+----------+--------+  Antecubital fossa    0.20                         +-----------------+-------------+----------+--------+   Summary: Right: Patent cephalic and basilic veins where visualized.  Left: Patent cephalic and basilic veins where visualized.      Assessment/Plan:    57 year old female with end-stage renal disease  on dialysis via tunneled dialysis catheter.  Plan will be for right arm AV  fistula versus more likely graft on a nondialysis day in the near future.  We discussed the risk benefits alternatives possible primary nonfunction, possible need for additional procedures and she demonstrates good understanding.     Waynetta Sandy MD [facility]

## 2022-04-12 ENCOUNTER — Other Ambulatory Visit: Payer: Self-pay

## 2022-04-12 DIAGNOSIS — N186 End stage renal disease: Secondary | ICD-10-CM

## 2022-05-02 NOTE — Progress Notes (Signed)
I I was unable to reach Ms Liska by phone, I left message to have her to call me.  I left  a message on voice mail.  I instructed the patient to arrive at [place] entrance at 0800 , register in the [facility]. DO NOT eat or drink anything after midnight.  I instructed the patient to take the following medications in the am with just enough water to get them down: Amlodipine,use Advair. I asked patient to wash up with antibiotic soap if she has it, if not use her regular.  I asked patient to not wear any lotions, powders, cologne, jewelry, piercing, make-up or nail polish.  Wear clean clothes. Brush teeth. I informed patient that there will need to be a driver and someone to stay with him/her for the first 24 hours after surgery.  I instructed  patient to call [phone].   I do not see any medications for diabetes, but she has  I I instructed patient to check CBG after awaking and every 2 hours until arrival  to the hospital. I Instructed patient if CBG is less than 70 to take 4 Glucose Tablets or 1 tube of Glucose Gel or 1/2 cup of a clear juice. Recheck CBG in 15 minutes if CBG is not over 70 call, pre- op desk at [phone] for further instructions.

## 2022-05-03 ENCOUNTER — Ambulatory Visit (HOSPITAL_COMMUNITY)
Admission: RE | Admit: 2022-05-03 | Discharge: 2022-05-03 | Disposition: A | Discharge status: Home / Self Care | Payer: Medicare Other | Attending: Vascular Surgery | Admitting: Vascular Surgery

## 2022-05-03 ENCOUNTER — Other Ambulatory Visit: Payer: Self-pay

## 2022-05-03 ENCOUNTER — Encounter (HOSPITAL_COMMUNITY)
Admission: RE | Disposition: A | Discharge status: Home / Self Care | Payer: Self-pay | Source: Home / Self Care | Attending: Vascular Surgery

## 2022-05-03 ENCOUNTER — Ambulatory Visit (HOSPITAL_BASED_OUTPATIENT_CLINIC_OR_DEPARTMENT_OTHER): Payer: Medicare Other | Admitting: Anesthesiology

## 2022-05-03 ENCOUNTER — Ambulatory Visit (HOSPITAL_COMMUNITY): Payer: Medicare Other | Admitting: Anesthesiology

## 2022-05-03 ENCOUNTER — Encounter (HOSPITAL_COMMUNITY): Payer: Self-pay | Admitting: Vascular Surgery

## 2022-05-03 DIAGNOSIS — E1122 Type 2 diabetes mellitus with diabetic chronic kidney disease: Secondary | ICD-10-CM | POA: Insufficient documentation

## 2022-05-03 DIAGNOSIS — N186 End stage renal disease: Secondary | ICD-10-CM

## 2022-05-03 DIAGNOSIS — Z992 Dependence on renal dialysis: Secondary | ICD-10-CM | POA: Insufficient documentation

## 2022-05-03 DIAGNOSIS — I120 Hypertensive chronic kidney disease with stage 5 chronic kidney disease or end stage renal disease: Secondary | ICD-10-CM

## 2022-05-03 DIAGNOSIS — Z87891 Personal history of nicotine dependence: Secondary | ICD-10-CM | POA: Diagnosis not present

## 2022-05-03 DIAGNOSIS — N185 Chronic kidney disease, stage 5: Secondary | ICD-10-CM | POA: Diagnosis not present

## 2022-05-03 DIAGNOSIS — J45909 Unspecified asthma, uncomplicated: Secondary | ICD-10-CM | POA: Insufficient documentation

## 2022-05-03 DIAGNOSIS — Z7984 Long term (current) use of oral hypoglycemic drugs: Secondary | ICD-10-CM

## 2022-05-03 HISTORY — PX: AV FISTULA PLACEMENT: SHX1204

## 2022-05-03 LAB — POCT I-STAT, CHEM 8
BUN: 42 mg/dL — ABNORMAL HIGH (ref 6–20)
Calcium, Ion: 0.98 mmol/L — ABNORMAL LOW (ref 1.15–1.40)
Chloride: 100 mmol/L (ref 98–111)
Creatinine, Ser: 7.2 mg/dL — ABNORMAL HIGH (ref 0.44–1.00)
Glucose, Bld: 92 mg/dL (ref 70–99)
HCT: 34 % — ABNORMAL LOW (ref 36.0–46.0)
Hemoglobin: 11.6 g/dL — ABNORMAL LOW (ref 12.0–15.0)
Potassium: 4.8 mmol/L (ref 3.5–5.1)
Sodium: 138 mmol/L (ref 135–145)
TCO2: 30 mmol/L (ref 22–32)

## 2022-05-03 LAB — GLUCOSE, CAPILLARY
Glucose-Capillary: 104 mg/dL — ABNORMAL HIGH (ref 70–99)
Glucose-Capillary: 119 mg/dL — ABNORMAL HIGH (ref 70–99)
Glucose-Capillary: 91 mg/dL (ref 70–99)

## 2022-05-03 SURGERY — ARTERIOVENOUS (AV) FISTULA CREATION
Anesthesia: Monitor Anesthesia Care | Site: Arm Upper | Laterality: Right

## 2022-05-03 MED ORDER — HEPARIN SODIUM (PORCINE) 1000 UNIT/ML IJ SOLN
INTRAMUSCULAR | Status: AC
  Filled 2022-05-03: qty 10

## 2022-05-03 MED ORDER — MIDAZOLAM HCL 2 MG/2ML IJ SOLN
INTRAMUSCULAR | Status: DC | PRN
  Administered 2022-05-03: 2 mg via INTRAVENOUS

## 2022-05-03 MED ORDER — EPHEDRINE 5 MG/ML INJ
INTRAVENOUS | Status: AC
  Filled 2022-05-03: qty 5

## 2022-05-03 MED ORDER — PROTAMINE SULFATE 10 MG/ML IV SOLN
INTRAVENOUS | Status: AC
  Filled 2022-05-03: qty 25

## 2022-05-03 MED ORDER — CHLORHEXIDINE GLUCONATE 4 % EX LIQD: 60.0000 mL | Freq: Once | CUTANEOUS | Status: DC

## 2022-05-03 MED ORDER — VANCOMYCIN HCL IN DEXTROSE 1-5 GM/200ML-% IV SOLN
1000.0000 mg | INTRAVENOUS | Status: AC
  Administered 2022-05-03: 1000 mg via INTRAVENOUS
  Filled 2022-05-03: qty 200

## 2022-05-03 MED ORDER — LIDOCAINE 2% (20 MG/ML) 5 ML SYRINGE
INTRAMUSCULAR | Status: DC | PRN
  Administered 2022-05-03: 60 mg via INTRAVENOUS

## 2022-05-03 MED ORDER — PHENYLEPHRINE HCL-NACL 20-0.9 MG/250ML-% IV SOLN
INTRAVENOUS | Status: DC | PRN
  Administered 2022-05-03: 50 ug/min via INTRAVENOUS

## 2022-05-03 MED ORDER — HEPARIN 6000 UNIT IRRIGATION SOLUTION
Status: AC
  Filled 2022-05-03: qty 500

## 2022-05-03 MED ORDER — FENTANYL CITRATE (PF) 250 MCG/5ML IJ SOLN
INTRAMUSCULAR | Status: DC | PRN
  Administered 2022-05-03: 100 ug via INTRAVENOUS
  Administered 2022-05-03 (×2): 50 ug via INTRAVENOUS

## 2022-05-03 MED ORDER — ORAL CARE MOUTH RINSE: 15.0000 mL | Freq: Once | OROMUCOSAL | Status: AC

## 2022-05-03 MED ORDER — LIDOCAINE 2% (20 MG/ML) 5 ML SYRINGE
INTRAMUSCULAR | Status: AC
  Filled 2022-05-03: qty 5

## 2022-05-03 MED ORDER — CHLORHEXIDINE GLUCONATE 0.12 % MT SOLN
15.0000 mL | Freq: Once | OROMUCOSAL | Status: AC
  Administered 2022-05-03: 15 mL via OROMUCOSAL
  Filled 2022-05-03: qty 15

## 2022-05-03 MED ORDER — EPHEDRINE SULFATE-NACL 50-0.9 MG/10ML-% IV SOSY
PREFILLED_SYRINGE | INTRAVENOUS | Status: DC | PRN
  Administered 2022-05-03: 10 mg via INTRAVENOUS

## 2022-05-03 MED ORDER — INSULIN ASPART 100 UNIT/ML IJ SOLN: 0.0000 [IU] | INTRAMUSCULAR | Status: DC | PRN

## 2022-05-03 MED ORDER — OXYCODONE HCL 5 MG PO TABS: 5.0000 mg | ORAL_TABLET | Freq: Once | ORAL | Status: DC | PRN

## 2022-05-03 MED ORDER — OXYCODONE-ACETAMINOPHEN 5-325 MG PO TABS: 1.0000 | ORAL_TABLET | Freq: Four times a day (QID) | ORAL | 0 refills | Status: DC | PRN

## 2022-05-03 MED ORDER — PROMETHAZINE HCL 25 MG/ML IJ SOLN: 6.2500 mg | INTRAMUSCULAR | Status: DC | PRN

## 2022-05-03 MED ORDER — PROPOFOL 10 MG/ML IV BOLUS
INTRAVENOUS | Status: DC | PRN
  Administered 2022-05-03: 120 mg via INTRAVENOUS

## 2022-05-03 MED ORDER — FENTANYL CITRATE (PF) 250 MCG/5ML IJ SOLN
INTRAMUSCULAR | Status: AC
  Filled 2022-05-03: qty 5

## 2022-05-03 MED ORDER — ACETAMINOPHEN 500 MG PO TABS
1000.0000 mg | ORAL_TABLET | Freq: Once | ORAL | Status: AC
  Administered 2022-05-03: 1000 mg via ORAL
  Filled 2022-05-03: qty 2

## 2022-05-03 MED ORDER — OXYCODONE HCL 5 MG/5ML PO SOLN: 5.0000 mg | Freq: Once | ORAL | Status: DC | PRN

## 2022-05-03 MED ORDER — ONDANSETRON HCL 4 MG/2ML IJ SOLN
INTRAMUSCULAR | Status: DC | PRN
  Administered 2022-05-03: 4 mg via INTRAVENOUS

## 2022-05-03 MED ORDER — HEPARIN 6000 UNIT IRRIGATION SOLUTION
Status: DC | PRN
  Administered 2022-05-03: 1

## 2022-05-03 MED ORDER — DEXAMETHASONE SODIUM PHOSPHATE 10 MG/ML IJ SOLN
INTRAMUSCULAR | Status: AC
  Filled 2022-05-03: qty 1

## 2022-05-03 MED ORDER — SODIUM CHLORIDE 0.9 % IV SOLN
INTRAVENOUS | Status: DC
  Administered 2022-05-03: 10 mL via INTRAVENOUS

## 2022-05-03 MED ORDER — DEXAMETHASONE SODIUM PHOSPHATE 10 MG/ML IJ SOLN
INTRAMUSCULAR | Status: DC | PRN
  Administered 2022-05-03: 10 mg via INTRAVENOUS

## 2022-05-03 MED ORDER — FENTANYL CITRATE (PF) 100 MCG/2ML IJ SOLN: 25.0000 ug | INTRAMUSCULAR | Status: DC | PRN

## 2022-05-03 MED ORDER — PROPOFOL 10 MG/ML IV BOLUS
INTRAVENOUS | Status: AC
  Filled 2022-05-03: qty 20

## 2022-05-03 MED ORDER — MIDAZOLAM HCL 2 MG/2ML IJ SOLN
INTRAMUSCULAR | Status: AC
  Filled 2022-05-03: qty 2

## 2022-05-03 MED ORDER — 0.9 % SODIUM CHLORIDE (POUR BTL) OPTIME
TOPICAL | Status: DC | PRN
  Administered 2022-05-03: 1000 mL

## 2022-05-03 MED ORDER — ONDANSETRON HCL 4 MG/2ML IJ SOLN
INTRAMUSCULAR | Status: AC
  Filled 2022-05-03: qty 2

## 2022-05-03 SURGICAL SUPPLY — 33 items
ADH SKN CLS APL DERMABOND .7 (GAUZE/BANDAGES/DRESSINGS) ×1
ARMBAND PINK RESTRICT EXTREMIT (MISCELLANEOUS) ×1 IMPLANT
BAG COUNTER SPONGE SURGICOUNT (BAG) ×1 IMPLANT
BAG SPNG CNTER NS LX DISP (BAG) ×1
CANISTER SUCT 3000ML PPV (MISCELLANEOUS) ×1 IMPLANT
CLIP LIGATING EXTRA MED SLVR (CLIP) ×1 IMPLANT
CLIP LIGATING EXTRA SM BLUE (MISCELLANEOUS) ×1 IMPLANT
COVER PROBE W GEL 5X96 (DRAPES) IMPLANT
DERMABOND ADVANCED .7 DNX12 (GAUZE/BANDAGES/DRESSINGS) ×1 IMPLANT
ELECT REM PT RETURN 9FT ADLT (ELECTROSURGICAL) ×1
ELECTRODE REM PT RTRN 9FT ADLT (ELECTROSURGICAL) ×1 IMPLANT
GLOVE BIO SURGEON STRL SZ7.5 (GLOVE) ×1 IMPLANT
GLOVE SURG SS PI 7.0 STRL IVOR (GLOVE) IMPLANT
GOWN STRL REUS W/ TWL LRG LVL3 (GOWN DISPOSABLE) ×2 IMPLANT
GOWN STRL REUS W/ TWL XL LVL3 (GOWN DISPOSABLE) ×1 IMPLANT
GOWN STRL REUS W/TWL LRG LVL3 (GOWN DISPOSABLE) ×2
GOWN STRL REUS W/TWL XL LVL3 (GOWN DISPOSABLE) ×1
INSERT FOGARTY SM (MISCELLANEOUS) IMPLANT
KIT BASIN OR (CUSTOM PROCEDURE TRAY) ×1 IMPLANT
KIT TURNOVER KIT B (KITS) ×1 IMPLANT
NS IRRIG 1000ML POUR BTL (IV SOLUTION) ×1 IMPLANT
PACK CV ACCESS (CUSTOM PROCEDURE TRAY) ×1 IMPLANT
PAD ARMBOARD 7.5X6 YLW CONV (MISCELLANEOUS) ×2 IMPLANT
POWDER SURGICEL 3.0 GRAM (HEMOSTASIS) IMPLANT
SLING ARM FOAM STRAP LRG (SOFTGOODS) IMPLANT
SLING ARM FOAM STRAP MED (SOFTGOODS) IMPLANT
SUT MNCRL AB 4-0 PS2 18 (SUTURE) ×1 IMPLANT
SUT PROLENE 6 0 BV (SUTURE) ×1 IMPLANT
SUT VIC AB 3-0 SH 27 (SUTURE) ×1
SUT VIC AB 3-0 SH 27X BRD (SUTURE) ×1 IMPLANT
TOWEL GREEN STERILE (TOWEL DISPOSABLE) ×1 IMPLANT
UNDERPAD 30X36 HEAVY ABSORB (UNDERPADS AND DIAPERS) ×1 IMPLANT
WATER STERILE IRR 1000ML POUR (IV SOLUTION) ×1 IMPLANT

## 2022-05-03 NOTE — Anesthesia Postprocedure Evaluation (Signed)
Anesthesia Post Note  Patient: Telisha Massimino  Procedure(s) Performed: RIGHT ARM  BRACHIOBASILIC ARTERIOVENOUS (AV) FISTULA (Right: Arm Upper)     Patient location during evaluation: PACU Anesthesia Type: MAC Level of consciousness: awake and alert Pain management: pain level controlled Vital Signs Assessment: post-procedure vital signs reviewed and stable Respiratory status: spontaneous breathing, nonlabored ventilation and respiratory function stable Cardiovascular status: stable and blood pressure returned to baseline Anesthetic complications: no   No notable events documented.  Last Vitals:  Vitals:   05/03/22 1145 05/03/22 1200  BP: (!) 159/71 (!) 168/73  Pulse: 76 77  Resp: 13 12  Temp:  36.4 C  SpO2: 95% 99%    Last Pain:  Vitals:   05/03/22 1130  TempSrc:   PainSc: 0-No pain                 Audry Pili

## 2022-05-03 NOTE — Anesthesia Preprocedure Evaluation (Addendum)
Anesthesia Evaluation  Patient identified by MRN, date of birth, ID band Patient awake    Reviewed: Allergy & Precautions, NPO status , Patient's Chart, lab work & pertinent test results  History of Anesthesia Complications Negative for: history of anesthetic complications  Airway Mallampati: II  TM Distance: >3 FB Neck ROM: Full    Dental  (+) Dental Advisory Given, Poor Dentition, Chipped, Missing   Pulmonary asthma , former smoker   Pulmonary exam normal        Cardiovascular hypertension, Pt. on medications Normal cardiovascular exam     Neuro/Psych negative neurological ROS  negative psych ROS   GI/Hepatic negative GI ROS, Neg liver ROS,,,  Endo/Other  diabetes, Type 2, Oral Hypoglycemic Agents    Renal/GU ESRF and DialysisRenal disease     Musculoskeletal negative musculoskeletal ROS (+)    Abdominal   Peds  Hematology negative hematology ROS (+)   Anesthesia Other Findings   Reproductive/Obstetrics  s/p tubal ligation                               Anesthesia Physical Anesthesia Plan  ASA: 3  Anesthesia Plan: General   Post-op Pain Management:    Induction: Intravenous  PONV Risk Score and Plan: 3 and Treatment may vary due to age or medical condition, Ondansetron, Dexamethasone and Midazolam  Airway Management Planned: LMA  Additional Equipment: None  Intra-op Plan:   Post-operative Plan: Extubation in OR  Informed Consent: I have reviewed the patients History and Physical, chart, labs and discussed the procedure including the risks, benefits and alternatives for the proposed anesthesia with the patient or authorized representative who has indicated his/her understanding and acceptance.     Dental advisory given  Plan Discussed with: CRNA and Anesthesiologist  Anesthesia Plan Comments: (Discussed regional anesthesia / nerve block with patient, patient  refused and requested "same anesthesia as last time", which was a general anesthetic.)        Anesthesia Quick Evaluation

## 2022-05-03 NOTE — Transfer of Care (Signed)
Immediate Anesthesia Transfer of Care Note  Patient: [name]  Procedure(s) Performed: RIGHT ARM  BRACHIOBASILIC ARTERIOVENOUS (AV) FISTULA (Right: Arm Upper)  Patient Location: PACU  Anesthesia Type:General  Level of Consciousness: drowsy and patient cooperative  Airway & Oxygen Therapy: Patient Spontanous Breathing  Post-op Assessment: Report given to RN and Post -op Vital signs reviewed and stable  Post vital signs: Reviewed and stable  Last Vitals:  Vitals Value Taken Time  BP    Temp    Pulse    Resp    SpO2      Last Pain:  Vitals:   05/03/22 0840  TempSrc:   PainSc: 0-No pain         Complications: No notable events documented.

## 2022-05-03 NOTE — Discharge Instructions (Signed)
   [facility]  Discharge Instructions  AV Fistula or Graft Surgery for Dialysis Access  Please refer to the following instructions for your post-procedure care. Your surgeon or physician assistant will discuss any changes with you.  Activity  You may drive the day following your surgery, if you are comfortable and no longer taking prescription pain medication. Resume full activity as the soreness in your incision resolves.  Bathing/Showering  You may shower after you go home. Keep your incision dry for 48 hours. Do not soak in a bathtub, hot tub, or swim until the incision heals completely. You may not shower if you have a hemodialysis catheter.  Incision Care  Clean your incision with mild soap and water after 48 hours. Pat the area dry with a clean towel. You do not need a bandage unless otherwise instructed. Do not apply any ointments or creams to your incision. You may have skin glue on your incision. Do not peel it off. It will come off on its own in about one week. Your arm may swell a bit after surgery. To reduce swelling use pillows to elevate your arm so it is above your heart. Your doctor will tell you if you need to lightly wrap your arm with an ACE bandage.  Diet  Resume your normal diet. There are not special food restrictions following this procedure. In order to heal from your surgery, it is CRITICAL to get adequate nutrition. Your body requires vitamins, minerals, and protein. Vegetables are the best source of vitamins and minerals. Vegetables also provide the perfect balance of protein. Processed food has little nutritional value, so try to avoid this.  Medications  Resume taking all of your medications. If your incision is causing pain, you may take over-the counter pain relievers such as acetaminophen (Tylenol). If you were prescribed a stronger pain medication, please be aware these medications can cause nausea and constipation. Prevent  nausea by taking the medication with a snack or meal. Avoid constipation by drinking plenty of fluids and eating foods with high amount of fiber, such as fruits, vegetables, and grains.  Do not take Tylenol if you are taking prescription pain medications.  Follow up Your surgeon may want to see you in the office following your access surgery. If so, this will be arranged at the time of your surgery.  Please call us immediately for any of the following conditions:  Increased pain, redness, drainage (pus) from your incision site Fever of 101 degrees or higher Severe or worsening pain at your incision site Hand pain or numbness.  Reduce your risk of vascular disease:  Stop smoking. If you would like help, call [facility] at 1-800-QUIT-NOW [phone]) or [place] at [place] your cholesterol Maintain a desired weight Control your diabetes Keep your blood pressure down  Dialysis  It will take several weeks to several months for your new dialysis access to be ready for use. Your surgeon will determine when it is okay to use it. Your nephrologist will continue to direct your dialysis. You can continue to use your Permcath until your new access is ready for use.   05/03/2022 [name] [id number] 10-Sep-1965  Surgeon(s): Waynetta Sandy, MD  Procedure(s): Creation of right arm 1st stage basilic vein transposition  x Do not stick fistula for 12 weeks    If you have any questions, please call the office at [phone].

## 2022-05-03 NOTE — Op Note (Signed)
    Patient name: [name] MRN: [id number] DOB: 26-Apr-1965 Sex: female  05/03/2022 Pre-operative Diagnosis: End-stage renal disease Post-operative diagnosis:  Same Surgeon:  Erlene Quan C. Donzetta Matters, MD Assistant: Ellsworth Lennox, RNFA Procedure Performed: Right arm brachial artery to basilic vein AV fistula creation  Indications: 57 year old female with end-stage renal disease currently dialyzing via catheter.  She is left-hand dominant now indicated for right arm access.  By preoperative ultrasound she did not have an identifiable cephalic vein and no suitable basilic vein for fistula creation.  Findings: On ultrasound evaluation after anesthesia induction patient had approximately 3 mm basilic vein which was spatulated to over 4 mm.  The brachial artery was 3 mm.  At completion there was a very strong thrill in the fistula confirmed with Doppler and a palpable radial artery pulse at the wrist confirmed with Doppler.   Procedure:  The patient was identified in the holding area and taken to the operating room where she was placed supine operative table and LMA anesthesia was induced.  She was sterilely prepped and draped in the right upper extremity usual fashion, antibiotics were administered and a timeout was called.  We began using ultrasound identified what appeared to be a suitable basilic vein above the antecubitum.  We made a transverse incision dissected down to the basilic vein marked this for orientation divided branches between clips and ties.  We dissected through the deep fascia the brachial artery encircled this with vessel loop.  The basilic vein was then clamped centrally tied off distally and clipped distally and divided.  It was spatulated flushed with heparinized saline and reclamped.  The artery was clamped distally proximally opened longitudinally flushed with heparinized saline both directions.  The vein was sewn into side with 6-0 Prolene suture.  Prior to completion we allowed flushing  all directions.  Upon completion the vein was much larger with very strong thrill confirmed with Doppler and there was a palpable radial artery pulse also confirmed with Doppler and the ulnar signal at the wrist was confirmed with Doppler.  Satisfied with this we irrigated the wound obtain hemostasis and closed in layers with Vicryl and Monocryl.  Dermabond was placed at the skin level.  The patient was awakened from anesthesia having tolerated procedure without any complication.  All counts were correct at completion.  EBL: 20 cc    Mathias Bogacki C. Donzetta Matters, MD [facility] Office: [phone] Pager: [phone]

## 2022-05-03 NOTE — H&P (Signed)
HPI:   [name] is a 57 y.o. female originally from [place].  Now on dialysis via right IJ tunneled catheter.  She is left-hand dominant.  She denies any history of upper extremity, chest or breast surgery.  She is not on any blood thinners.  She had a catheter placed in the hospital at the time there was active infection and so no permanent access was placed.       Past Medical History:  Diagnosis Date   Asthma      Child   Chronic kidney disease     Diabetes mellitus     Obesity           Family History  Problem Relation Age of Onset   Diabetes Mother           Past Surgical History:  Procedure Laterality Date   CHOLECYSTECTOMY       INSERTION OF DIALYSIS CATHETER Right 01/04/2022    Procedure: INSERTION OF TUNNELED DIALYSIS CATHETER;  Surgeon: Angelia Mould, MD;  Location: [facility] OR;  Service: Vascular;  Laterality: Right;   TUBAL LIGATION          Short Social History:  Social History         Tobacco Use   Smoking status: Former      Packs/day: 0.00      Types: Cigarettes   Smokeless tobacco: Never   Tobacco comments:      Smoked when she was a teenager.   Substance Use Topics   Alcohol use: Yes      Comment: occasionally          Allergies  Allergen Reactions   Penicillins Rash            Current Outpatient Medications  Medication Sig Dispense Refill   albuterol (VENTOLIN HFA) 108 (90 Base) MCG/ACT inhaler Inhale 1-2 puffs into the lungs every 6 (six) hours as needed for wheezing or shortness of breath. (Patient taking differently: Inhale 1-2 puffs into the lungs daily as needed for wheezing or shortness of breath.) 18 g 0   amLODipine (NORVASC) 5 MG tablet Take 1 tablet (5 mg total) by mouth daily. 30 tablet 1   Blood Glucose Monitoring Suppl (TRUE METRIX METER) w/Device KIT Use to check blood sugar 3 times daily 1 kit 0   ceFEPIme 1 g in sodium chloride 0.9 % 100 mL Inject 1 g into the vein daily. With Hemodialysis until  11/20. 1 ampule 0   fluticasone-salmeterol (ADVAIR) 500-50 MCG/ACT AEPB Inhale 1 puff into the lungs in the morning and at bedtime. 1 each 2   glucose blood (TRUE METRIX BLOOD GLUCOSE TEST) test strip Use to check blood sugar three times per day 100 each 12   sevelamer carbonate (RENVELA) 800 MG tablet Take 1 tablet (800 mg total) by mouth 3 (three) times daily with meals. 30 tablet 0   TRUEPLUS LANCETS 28G MISC Use to check blood sugar 3 times daily 100 each 11    No current facility-administered medications for this visit.      Review of Systems  Constitutional:  Constitutional negative. HENT: HENT negative.  Eyes: Eyes negative.  Respiratory: Respiratory negative.  Cardiovascular: Cardiovascular negative.  GI: Gastrointestinal negative.  Musculoskeletal: Musculoskeletal negative.  Skin: Skin negative.  Neurological: Neurological negative. Hematologic: Hematologic/lymphatic negative.  Psychiatric: Psychiatric negative.          Objective:    Vitals:   05/03/22 0802  BP: (!) 151/72  Pulse:  76  Resp: 18  Temp: 98.1 F (36.7 C)  SpO2: 100%      Physical Exam HENT:     Head: Normocephalic.     Nose: Nose normal.  Eyes:     Pupils: Pupils are equal, round, and reactive to light.  Cardiovascular:     Rate and Rhythm: Normal rate.     Pulses:          Radial pulses are 2+ on the right side and 2+ on the left side.  Pulmonary:     Effort: Pulmonary effort is normal.  Abdominal:     General: Abdomen is flat.  Musculoskeletal:        General: Normal range of motion.     Right lower leg: No edema.     Left lower leg: No edema.  Skin:    General: Skin is warm.     Capillary Refill: Capillary refill takes less than 2 seconds.  Neurological:     General: No focal deficit present.     Mental Status: She is alert.  Psychiatric:        Mood and Affect: Mood normal.        Behavior: Behavior normal.        Thought Content: Thought content normal.        Judgment:  Judgment normal.        Data: Right Pre-Dialysis Findings:  +----------------------------+---------+-------------------+----------+----  ----+  Location                   PSV      Intralum. Diam.    Waveform   Comments                             (cm/s)   (cm)                                    +----------------------------+---------+-------------------+----------+----  ----+  Radial artery distal upper  72       0.3                triphasic            arm                                                                          +----------------------------+---------+-------------------+----------+----  ----+  Ulnar artery distal upper   78       0.4                triphasic            arm                                                                          +----------------------------+---------+-------------------+----------+----  ----+  Radial Art at Wrist  58       0.16               triphasic            +----------------------------+---------+-------------------+----------+----  ----+  Ulnar Art at Wrist          41       0.14               triphasic            +----------------------------+---------+-------------------+----------+----  ----+       Left Pre-Dialysis Findings:  +-----------------------+----------+--------------------+---------+--------  +  Location              PSV (cm/s)Intralum. Diam. (cm)Waveform  Comments  +-----------------------+----------+--------------------+---------+--------  +  Brachial Antecub. fossa94        0.44                triphasic           +-----------------------+----------+--------------------+---------+--------  +  Radial Art at Wrist    85        0.17                triphasic           +-----------------------+----------+--------------------+---------+--------  +  Ulnar Art at Wrist     42        0.13                triphasic            +-----------------------+----------+--------------------+---------+--------  +        Summary:    Right: Bifurcation in the proximal upper arm. Patent radial and         ulnar arteries.  Left: Patent brachial, radial, and ulnar arteries.    Right Cephalic   Diameter (cm)Depth (cm)   Findings     +-----------------+-------------+----------+--------------+  Shoulder            0.24                               +-----------------+-------------+----------+--------------+  Prox upper arm       0.27                               +-----------------+-------------+----------+--------------+  Mid upper arm        0.12                               +-----------------+-------------+----------+--------------+  Dist upper arm                          not visualized  +-----------------+-------------+----------+--------------+  Antecubital fossa                       not visualized  +-----------------+-------------+----------+--------------+  Prox forearm         0.20                               +-----------------+-------------+----------+--------------+  Mid forearm          0.19                               +-----------------+-------------+----------+--------------+  Dist forearm  0.16                               +-----------------+-------------+----------+--------------+   +-----------------+-------------+----------+--------+  Right Basilic    Diameter (cm)Depth (cm)Findings  +-----------------+-------------+----------+--------+  Mid upper arm        0.26                         +-----------------+-------------+----------+--------+  Dist upper arm       0.26                         +-----------------+-------------+----------+--------+  Antecubital fossa    0.21                         +-----------------+-------------+----------+--------+   +-----------------+-------------+----------+--------------+  Left  Cephalic    Diameter (cm)Depth (cm)   Findings     +-----------------+-------------+----------+--------------+  Shoulder            0.27                               +-----------------+-------------+----------+--------------+  Prox upper arm       0.25                               +-----------------+-------------+----------+--------------+  Mid upper arm        0.16                               +-----------------+-------------+----------+--------------+  Dist upper arm       0.13                               +-----------------+-------------+----------+--------------+  Antecubital fossa                       not visualized  +-----------------+-------------+----------+--------------+  Prox forearm         0.19                               +-----------------+-------------+----------+--------------+  Mid forearm          0.12                               +-----------------+-------------+----------+--------------+  Dist forearm         0.14                               +-----------------+-------------+----------+--------------+   +-----------------+-------------+----------+--------+  Left Basilic     Diameter (cm)Depth (cm)Findings  +-----------------+-------------+----------+--------+  Dist upper arm       0.25                         +-----------------+-------------+----------+--------+  Antecubital fossa    0.20                         +-----------------+-------------+----------+--------+   Summary: Right: Patent cephalic and basilic veins where visualized.  Left: Patent  cephalic and basilic veins where visualized.       Assessment/Plan:    57 year old female with end-stage renal disease on dialysis via tunneled dialysis catheter.  Plan will be for right arm AV fistula versus more likely graft today in the OR.  We discussed the risk benefits alternatives possible primary nonfunction, possible need for additional  procedures and she demonstrates good understanding.      Skipper Dacosta C. Donzetta Matters, MD [facility] Office: [phone] Pager: [phone]

## 2022-05-03 NOTE — Anesthesia Procedure Notes (Signed)
Procedure Name: Intubation Date/Time: 05/03/2022 10:21 AM  Performed by: Lance Coon, CRNAPre-anesthesia Checklist: Patient identified, Emergency Drugs available, Suction available, Patient being monitored and Timeout performed Patient Re-evaluated:Patient Re-evaluated prior to induction Oxygen Delivery Method: Circle system utilized Preoxygenation: Pre-oxygenation with 100% oxygen Induction Type: IV induction LMA: LMA inserted LMA Size: 4.0 Number of attempts: 1 Placement Confirmation: positive ETCO2 and breath sounds checked- equal and bilateral Tube secured with: Tape Dental Injury: Teeth and Oropharynx as per pre-operative assessment

## 2022-05-04 ENCOUNTER — Encounter (HOSPITAL_COMMUNITY): Payer: Self-pay | Admitting: Vascular Surgery

## 2022-05-09 ENCOUNTER — Telehealth: Payer: Self-pay | Admitting: Physician Assistant

## 2022-05-09 NOTE — Telephone Encounter (Signed)
-----   Message from Gabriel Earing, [place] sent at 05/03/2022 11:04 AM EST ----- S/p right 1st stage BVT 3/8. Please have pt f/u in 6 weeks with duplex on Dr. Donzetta Matters clinic day.  Thanks

## 2022-05-21 ENCOUNTER — Encounter: Payer: Self-pay | Admitting: Physician Assistant

## 2022-05-21 DIAGNOSIS — N2581 Secondary hyperparathyroidism of renal origin: Secondary | ICD-10-CM | POA: Insufficient documentation

## 2022-06-05 ENCOUNTER — Ambulatory Visit: Payer: Medicare Other | Admitting: Family Medicine

## 2022-06-11 ENCOUNTER — Other Ambulatory Visit: Payer: Self-pay | Admitting: *Deleted

## 2022-06-11 DIAGNOSIS — N186 End stage renal disease: Secondary | ICD-10-CM

## 2022-06-19 ENCOUNTER — Ambulatory Visit (HOSPITAL_COMMUNITY)
Admission: RE | Admit: 2022-06-19 | Discharge: 2022-06-19 | Disposition: A | Discharge status: Home / Self Care | Payer: Medicare HMO | Source: Ambulatory Visit | Attending: Vascular Surgery | Admitting: Vascular Surgery

## 2022-06-19 ENCOUNTER — Encounter: Payer: Self-pay | Admitting: Physician Assistant

## 2022-06-19 ENCOUNTER — Ambulatory Visit: Payer: Medicare HMO | Admitting: Physician Assistant

## 2022-06-19 VITALS — BP 189/82 | HR 80 | Temp 97.1°F | Wt 215.0 lb

## 2022-06-19 DIAGNOSIS — N186 End stage renal disease: Secondary | ICD-10-CM | POA: Diagnosis not present

## 2022-06-19 NOTE — Progress Notes (Signed)
POST OPERATIVE OFFICE NOTE    CC:  F/u for surgery  HPI:  This is a 57 y.o. female who is s/p first stage basilic av fistula creation  on 05/03/22 by Dr. Randie Heinz.    Pt returns today for follow up.  Pt states she has no pain, or loss or motor/sensation in the right UE.  She has a working right IJ [facility] and has HD.  Started dialysis on 11/10 after placement of right IJ TDC by Dr. Edilia Bo. Per [facility]  plan for AV graft as outpatient. She prefers TTS is schedule and SW is following.     Allergies  Allergen Reactions   Penicillins Rash    Current Outpatient Medications  Medication Sig Dispense Refill   albuterol (VENTOLIN HFA) 108 (90 Base) MCG/ACT inhaler Inhale 1-2 puffs into the lungs every 6 (six) hours as needed for wheezing or shortness of breath. (Patient taking differently: Inhale 1-2 puffs into the lungs daily as needed for wheezing or shortness of breath.) 18 g 0   amLODipine (NORVASC) 5 MG tablet Take 1 tablet (5 mg total) by mouth daily. 30 tablet 1   Blood Glucose Monitoring Suppl (TRUE METRIX METER) w/Device KIT Use to check blood sugar 3 times daily 1 kit 0   ceFEPIme 1 g in sodium chloride 0.9 % 100 mL Inject 1 g into the vein daily. With Hemodialysis until 11/20. 1 ampule 0   fluticasone-salmeterol (ADVAIR) 500-50 MCG/ACT AEPB Inhale 1 puff into the lungs in the morning and at bedtime. 1 each 2   furosemide (LASIX) 20 MG tablet Take 20 mg by mouth daily.     glucose blood (TRUE METRIX BLOOD GLUCOSE TEST) test strip Use to check blood sugar three times per day 100 each 12   metFORMIN (GLUCOPHAGE) 500 MG tablet Take 500 mg by mouth daily. Dinner     sevelamer carbonate (RENVELA) 800 MG tablet Take 1 tablet (800 mg total) by mouth 3 (three) times daily with meals. (Patient taking differently: Take 1,600 mg by mouth 3 (three) times daily with meals.) 30 tablet 0   TRUEPLUS LANCETS 28G MISC Use to check blood sugar 3 times daily 100 each 11   No current facility-administered medications  for this visit.     ROS:  See HPI  Physical Exam:    Incision:  Well healed AC incision, palpable thrill in the distal fistula.  Palpable radial pulses, grip 5/5.    Findings:  +--------------------+----------+-----------------+--------+  AVF                PSV (cm/s)Flow Vol (mL/min)Comments  +--------------------+----------+-----------------+--------+  Native artery inflow   350           869                 +--------------------+----------+-----------------+--------+  AVF Anastomosis        846                               +--------------------+----------+-----------------+--------+     +------------+----------+-------------+----------+--------+  OUTFLOW VEINPSV (cm/s)Diameter (cm)Depth (cm)Describe  +------------+----------+-------------+----------+--------+  Prox UA        199        0.70        2.21             +------------+----------+-------------+----------+--------+  Mid UA         323        0.52  2.23             +------------+----------+-------------+----------+--------+  Dist UA        291        0.47        2.29             +------------+----------+-------------+----------+--------+  AC Fossa       323        0.41        2.12             +------------+----------+-------------+----------+--------+     Patient has high Brachial artery bifurcation. The Distal inflow artery is  mostly retrograde and does not normalize with AVF compression. AVF  compression is difficult due to outflow vein location and depth.     Summary:  Patent arteriovenous fistula.      Assessment/Plan:  This is a 57 y.o. female who is s/p: right first stage basilic av fistula creation.    The distal fistula is < 0.6 in diameter.  The depth is too deep to stick the fistula.  She has a fistula that is slow to mature.  I will give her exercises to do and instruction to use the right UE daily.  By demanding blood flow to the hand she will  also increase the inflow to the fistula to help it mature.    She will f/u in 2-3 weeks for repeat fistula duplex.  If the basilic fails to mature she is requesting a right arm graft.  She is some what upset that she has to have a second surgery for a second stage verses graft.       Lars Mage, [facility] [phone]   Clinic MD:  Randie Heinz

## 2022-06-26 ENCOUNTER — Other Ambulatory Visit: Payer: Self-pay

## 2022-06-26 DIAGNOSIS — N186 End stage renal disease: Secondary | ICD-10-CM

## 2022-07-10 ENCOUNTER — Ambulatory Visit (HOSPITAL_COMMUNITY): Payer: Medicare HMO | Attending: Vascular Surgery

## 2022-07-10 ENCOUNTER — Ambulatory Visit: Payer: Medicare HMO | Admitting: Vascular Surgery

## 2022-08-07 ENCOUNTER — Ambulatory Visit (INDEPENDENT_AMBULATORY_CARE_PROVIDER_SITE_OTHER): Payer: Medicare HMO | Admitting: Vascular Surgery

## 2022-08-07 ENCOUNTER — Ambulatory Visit (HOSPITAL_COMMUNITY)
Admission: RE | Admit: 2022-08-07 | Discharge: 2022-08-07 | Disposition: A | Discharge status: Home / Self Care | Payer: Medicare HMO | Source: Ambulatory Visit | Attending: Vascular Surgery | Admitting: Vascular Surgery

## 2022-08-07 ENCOUNTER — Encounter: Payer: Self-pay | Admitting: Vascular Surgery

## 2022-08-07 VITALS — BP 163/88 | HR 72 | Temp 97.9°F | Resp 20 | Ht 66.0 in | Wt 208.0 lb

## 2022-08-07 DIAGNOSIS — N186 End stage renal disease: Secondary | ICD-10-CM | POA: Insufficient documentation

## 2022-08-07 NOTE — H&P (View-Only) (Signed)
 Patient ID: [name], female   DOB: 11/29/1965, 57 y.o.   MRN: [id number]  Reason for Consult: Follow-up   Referred by Wilson, Amelia, MD  Subjective:     HPI:  [name] is a 57 y.o. female History of end-stage renal disease currently dialyzing via right IJ tunneled dialysis catheter on Tuesdays, Thursdays and Saturdays.  She does have a fistula that was placed in March of this year which has failed to fully mature.  She is here today with repeat dialysis duplex to discuss future surgical options. She is left-hand dominant not having any hand symptoms.  Past Medical History:  Diagnosis Date   Asthma    Child   Chronic kidney disease    Diabetes mellitus    Obesity    Family History  Problem Relation Age of Onset   Diabetes Mother    Past Surgical History:  Procedure Laterality Date   AV FISTULA PLACEMENT Right 05/03/2022   Procedure: RIGHT ARM  BRACHIOBASILIC ARTERIOVENOUS (AV) FISTULA;  Surgeon: Nickolette Espinola Christopher, MD;  Location: [facility] OR;  Service: Vascular;  Laterality: Right;   CHOLECYSTECTOMY     INSERTION OF DIALYSIS CATHETER Right 01/04/2022   Procedure: INSERTION OF TUNNELED DIALYSIS CATHETER;  Surgeon: Dickson, Christopher S, MD;  Location: [facility] OR;  Service: Vascular;  Laterality: Right;   TUBAL LIGATION      Short Social History:  Social History   Tobacco Use   Smoking status: Never   Smokeless tobacco: Never   Tobacco comments:    Smoked when she was a teenager.   Substance Use Topics   Alcohol use: Not Currently    Allergies  Allergen Reactions   Penicillins Rash    Current Outpatient Medications  Medication Sig Dispense Refill   albuterol (VENTOLIN HFA) 108 (90 Base) MCG/ACT inhaler Inhale 1-2 puffs into the lungs every 6 (six) hours as needed for wheezing or shortness of breath. (Patient taking differently: Inhale 1-2 puffs into the lungs daily as needed for wheezing or shortness of breath.) 18 g 0   amLODipine (NORVASC) 5 MG tablet  Take 1 tablet (5 mg total) by mouth daily. 30 tablet 1   Blood Glucose Monitoring Suppl (TRUE METRIX METER) w/Device KIT Use to check blood sugar 3 times daily 1 kit 0   ceFEPIme 1 g in sodium chloride 0.9 % 100 mL Inject 1 g into the vein daily. With Hemodialysis until 11/20. 1 ampule 0   fluticasone-salmeterol (ADVAIR) 500-50 MCG/ACT AEPB Inhale 1 puff into the lungs in the morning and at bedtime. 1 each 2   furosemide (LASIX) 20 MG tablet Take 20 mg by mouth daily.     glucose blood (TRUE METRIX BLOOD GLUCOSE TEST) test strip Use to check blood sugar three times per day 100 each 12   metFORMIN (GLUCOPHAGE) 500 MG tablet Take 500 mg by mouth daily. Dinner     sevelamer carbonate (RENVELA) 800 MG tablet Take 1 tablet (800 mg total) by mouth 3 (three) times daily with meals. (Patient taking differently: Take 1,600 mg by mouth 3 (three) times daily with meals.) 30 tablet 0   TRUEPLUS LANCETS 28G MISC Use to check blood sugar 3 times daily 100 each 11   No current facility-administered medications for this visit.    Review of Systems  Constitutional:  Constitutional negative. HENT: HENT negative.  Eyes: Eyes negative.  Respiratory: Respiratory negative.  Cardiovascular: Cardiovascular negative.  GI: Gastrointestinal negative.  Musculoskeletal: Musculoskeletal negative.  Neurological: Neurological negative. Hematologic: Hematologic/lymphatic   negative.  Psychiatric: Psychiatric negative.        Objective:  Objective   Vitals:   08/07/22 1048  BP: (!) 163/88  Pulse: 72  Resp: 20  Temp: 97.9 F (36.6 C)  SpO2: 96%  Weight: 208 lb (94.3 kg)  Height: 5' 6" (1.676 m)   Body mass index is 33.57 kg/m.  Physical Exam HENT:     Head: Normocephalic.     Mouth/Throat:     Mouth: Mucous membranes are moist.  Eyes:     Pupils: Pupils are equal, round, and reactive to light.  Cardiovascular:     Rate and Rhythm: Normal rate.     Pulses:          Radial pulses are 2+ on the right  side and 2+ on the left side.  Pulmonary:     Effort: Pulmonary effort is normal.  Abdominal:     General: Abdomen is flat.  Musculoskeletal:     Right lower leg: No edema.     Left lower leg: No edema.  Skin:    General: Skin is warm.     Capillary Refill: Capillary refill takes less than 2 seconds.  Neurological:     Mental Status: She is alert.  Psychiatric:        Mood and Affect: Mood normal.        Thought Content: Thought content normal.        Judgment: Judgment normal.     Data: AVF                 PSV (cm/s)Flow Vol (mL/min)          Comments             +--------------------+----------+-----------------+------------------------  ----+  Native artery inflow   393           731                                      +--------------------+----------+-----------------+------------------------  ----+  AVF Anastomosis        793                      PSV exceeds nyquist  limit                                                             >790cm/s             +--------------------+----------+-----------------+------------------------  ----+     +------------+----------+-------------+----------+--------+  OUTFLOW VEINPSV (cm/s)Diameter (cm)Depth (cm)Describe  +------------+----------+-------------+----------+--------+  Prox UA        242        0.74        1.55             +------------+----------+-------------+----------+--------+  Mid UA         327        0.53        1.92             +------------+----------+-------------+----------+--------+  Dist UA        303        0.52        1.90             +------------+----------+-------------+----------+--------+    AC Fossa       576        0.61        1.66             +------------+----------+-------------+----------+--------+        Summary:  Right UE AVF is patent with elevated PSV at the anastomsis site. The  distal inflow artery is mostly retrograde. With AVF  compression, distal  inflow artery flow becomes somewhate antegrade.      Assessment/Plan:    57-year-old female status post right-sided basilic vein fistula creation.  She is left-hand dominant.  Fistula has marginally matured.  Will plan for second stage basilic vein fistula versus conversion to graft on a nondialysis day in the very near future.  All risk benefits alternatives discussed with the patient she demonstrates good understanding     Kerilyn Cortner Christopher Jennetta Flood MD [facility]   

## 2022-08-07 NOTE — Progress Notes (Signed)
Patient ID: [name], female   DOB: 14-Oct-1965, 57 y.o.   MRN: [id number]  Reason for Consult: Follow-up   Referred by Natalie Skeans, MD  Subjective:     HPI:  [name] is a 57 y.o. female History of end-stage renal disease currently dialyzing via right IJ tunneled dialysis catheter on Tuesdays, Thursdays and Saturdays.  She does have a fistula that was placed in March of this year which has failed to fully mature.  She is here today with repeat dialysis duplex to discuss future surgical options. She is left-hand dominant not having any hand symptoms.  Past Medical History:  Diagnosis Date   Asthma    Child   Chronic kidney disease    Diabetes mellitus    Obesity    Family History  Problem Relation Age of Onset   Diabetes Mother    Past Surgical History:  Procedure Laterality Date   AV FISTULA PLACEMENT Right 05/03/2022   Procedure: RIGHT ARM  BRACHIOBASILIC ARTERIOVENOUS (AV) FISTULA;  Surgeon: Maeola Harman, MD;  Location: [facility] OR;  Service: Vascular;  Laterality: Right;   CHOLECYSTECTOMY     INSERTION OF DIALYSIS CATHETER Right 01/04/2022   Procedure: INSERTION OF TUNNELED DIALYSIS CATHETER;  Surgeon: Chuck Hint, MD;  Location: [facility] OR;  Service: Vascular;  Laterality: Right;   TUBAL LIGATION      Short Social History:  Social History   Tobacco Use   Smoking status: Never   Smokeless tobacco: Never   Tobacco comments:    Smoked when she was a teenager.   Substance Use Topics   Alcohol use: Not Currently    Allergies  Allergen Reactions   Penicillins Rash    Current Outpatient Medications  Medication Sig Dispense Refill   albuterol (VENTOLIN HFA) 108 (90 Base) MCG/ACT inhaler Inhale 1-2 puffs into the lungs every 6 (six) hours as needed for wheezing or shortness of breath. (Patient taking differently: Inhale 1-2 puffs into the lungs daily as needed for wheezing or shortness of breath.) 18 g 0   amLODipine (NORVASC) 5 MG tablet  Take 1 tablet (5 mg total) by mouth daily. 30 tablet 1   Blood Glucose Monitoring Suppl (TRUE METRIX METER) w/Device KIT Use to check blood sugar 3 times daily 1 kit 0   ceFEPIme 1 g in sodium chloride 0.9 % 100 mL Inject 1 g into the vein daily. With Hemodialysis until 11/20. 1 ampule 0   fluticasone-salmeterol (ADVAIR) 500-50 MCG/ACT AEPB Inhale 1 puff into the lungs in the morning and at bedtime. 1 each 2   furosemide (LASIX) 20 MG tablet Take 20 mg by mouth daily.     glucose blood (TRUE METRIX BLOOD GLUCOSE TEST) test strip Use to check blood sugar three times per day 100 each 12   metFORMIN (GLUCOPHAGE) 500 MG tablet Take 500 mg by mouth daily. Dinner     sevelamer carbonate (RENVELA) 800 MG tablet Take 1 tablet (800 mg total) by mouth 3 (three) times daily with meals. (Patient taking differently: Take 1,600 mg by mouth 3 (three) times daily with meals.) 30 tablet 0   TRUEPLUS LANCETS 28G MISC Use to check blood sugar 3 times daily 100 each 11   No current facility-administered medications for this visit.    Review of Systems  Constitutional:  Constitutional negative. HENT: HENT negative.  Eyes: Eyes negative.  Respiratory: Respiratory negative.  Cardiovascular: Cardiovascular negative.  GI: Gastrointestinal negative.  Musculoskeletal: Musculoskeletal negative.  Neurological: Neurological negative. Hematologic: Hematologic/lymphatic  negative.  Psychiatric: Psychiatric negative.        Objective:  Objective   Vitals:   08/07/22 1048  BP: (!) 163/88  Pulse: 72  Resp: 20  Temp: 97.9 F (36.6 C)  SpO2: 96%  Weight: 208 lb (94.3 kg)  Height: 5\' 6"  (1.676 m)   Body mass index is 33.57 kg/m.  Physical Exam HENT:     Head: Normocephalic.     Mouth/Throat:     Mouth: Mucous membranes are moist.  Eyes:     Pupils: Pupils are equal, round, and reactive to light.  Cardiovascular:     Rate and Rhythm: Normal rate.     Pulses:          Radial pulses are 2+ on the right  side and 2+ on the left side.  Pulmonary:     Effort: Pulmonary effort is normal.  Abdominal:     General: Abdomen is flat.  Musculoskeletal:     Right lower leg: No edema.     Left lower leg: No edema.  Skin:    General: Skin is warm.     Capillary Refill: Capillary refill takes less than 2 seconds.  Neurological:     Mental Status: She is alert.  Psychiatric:        Mood and Affect: Mood normal.        Thought Content: Thought content normal.        Judgment: Judgment normal.     Data: AVF                 PSV (cm/s)Flow Vol (mL/min)          Comments             +--------------------+----------+-----------------+------------------------  ----+  Native artery inflow   393           731                                      +--------------------+----------+-----------------+------------------------  ----+  AVF Anastomosis        793                      PSV exceeds nyquist  limit                                                             >790cm/s             +--------------------+----------+-----------------+------------------------  ----+     +------------+----------+-------------+----------+--------+  OUTFLOW VEINPSV (cm/s)Diameter (cm)Depth (cm)Describe  +------------+----------+-------------+----------+--------+  Prox UA        242        0.74        1.55             +------------+----------+-------------+----------+--------+  Mid UA         327        0.53        1.92             +------------+----------+-------------+----------+--------+  Dist UA        303        0.52        1.90             +------------+----------+-------------+----------+--------+  AC Fossa       576        0.61        1.66             +------------+----------+-------------+----------+--------+        Summary:  Right UE AVF is patent with elevated PSV at the anastomsis site. The  distal inflow artery is mostly retrograde. With AVF  compression, distal  inflow artery flow becomes somewhate antegrade.      Assessment/Plan:    57 year old female status post right-sided basilic vein fistula creation.  She is left-hand dominant.  Fistula has marginally matured.  Will plan for second stage basilic vein fistula versus conversion to graft on a nondialysis day in the very near future.  All risk benefits alternatives discussed with the patient she demonstrates good understanding     Maeola Harman MD [facility] [place]

## 2022-08-08 ENCOUNTER — Other Ambulatory Visit: Payer: Self-pay

## 2022-08-08 DIAGNOSIS — N186 End stage renal disease: Secondary | ICD-10-CM

## 2022-08-28 ENCOUNTER — Ambulatory Visit: Payer: Medicare HMO | Admitting: Family Medicine

## 2022-09-04 ENCOUNTER — Encounter (HOSPITAL_COMMUNITY): Payer: Self-pay | Admitting: Vascular Surgery

## 2022-09-04 ENCOUNTER — Other Ambulatory Visit: Payer: Self-pay

## 2022-09-04 NOTE — Progress Notes (Signed)
SDW call  Patient was given pre-op instructions over the phone. Patient verbalized understanding of instructions provided.     PCP - Dr. Georganna Skeans Cardiologist - denies Pulmonary: denies Nephrologist:  Sherryl Barters, PA Dialysis: [facility]  PPM/ICD - denies   Chest x-ray - 01/04/2022 EKG -  01/01/2022 Stress Test - ECHO -  Cardiac Cath -   Sleep Study/sleep apnea/CPAP: denies  Type II diabetes Fasting Blood sugar range: 80-90 How often check sugars: Three times a day Metformin, instructed to hold day of surgery   Blood Thinner Instructions: denies Aspirin Instructions:denies   ERAS Protcol - No, NPO PRE-SURGERY Ensure or G2-    COVID TEST- n/a    Anesthesia review: Yes.  DM, CKD, dialysis, asthma   Patient denies shortness of breath, fever, cough and chest pain over the phone call  Your procedure is scheduled on Friday September 06, 2022  Report to [facility] Main Entrance "A" at 0530  A.M., then check in with the Admitting office.  Call this number if you have problems the morning of surgery:  [phone]   If you have any questions prior to your surgery date call [phone]: Open Monday-Friday 8am-4pm If you experience any cold or flu symptoms such as cough, fever, chills, shortness of breath, etc. between now and your scheduled surgery, please notify us at the above number    Remember:  Do not eat and drink after midnight the night before your surgery   Take these medicines the morning of surgery with A SIP OF WATER:  Amlodipine, advair  As needed: Albuterol  As of today, STOP taking any Aspirin (unless otherwise instructed by your surgeon) Aleve, Naproxen, Ibuprofen, Motrin, Advil, Goody's, BC's, all herbal medications, fish oil, and all vitamins.

## 2022-09-05 NOTE — Anesthesia Preprocedure Evaluation (Addendum)
Anesthesia Evaluation  Patient identified by MRN, date of birth, ID band Patient awake    Reviewed: Allergy & Precautions, NPO status , Patient's Chart, lab work & pertinent test results  History of Anesthesia Complications Negative for: history of anesthetic complications  Airway Mallampati: II  TM Distance: >3 FB Neck ROM: Full    Dental  (+) Dental Advisory Given, Chipped, Poor Dentition, Missing   Pulmonary asthma    Pulmonary exam normal        Cardiovascular hypertension, Pt. on medications Normal cardiovascular exam     Neuro/Psych  Neuromuscular disease  negative psych ROS   GI/Hepatic Neg liver ROS,GERD  Controlled,,  Endo/Other  diabetes, Type 2, Oral Hypoglycemic Agents   Obesity   Renal/GU ESRF and DialysisRenal disease     Musculoskeletal negative musculoskeletal ROS (+)    Abdominal   Peds  Hematology negative hematology ROS (+)   Anesthesia Other Findings   Reproductive/Obstetrics                              Anesthesia Physical Anesthesia Plan  ASA: 3  Anesthesia Plan: General   Post-op Pain Management: Tylenol PO (pre-op)*   Induction: Intravenous  PONV Risk Score and Plan: 2 and Treatment may vary due to age or medical condition, Ondansetron, Dexamethasone and Midazolam  Airway Management Planned: LMA  Additional Equipment: None  Intra-op Plan:   Post-operative Plan: Extubation in OR  Informed Consent: I have reviewed the patients History and Physical, chart, labs and discussed the procedure including the risks, benefits and alternatives for the proposed anesthesia with the patient or authorized representative who has indicated his/her understanding and acceptance.     Dental advisory given  Plan Discussed with: CRNA and Anesthesiologist  Anesthesia Plan Comments:          Anesthesia Quick Evaluation

## 2022-09-06 ENCOUNTER — Ambulatory Visit (HOSPITAL_COMMUNITY): Payer: Medicare HMO | Admitting: Physician Assistant

## 2022-09-06 ENCOUNTER — Other Ambulatory Visit (HOSPITAL_COMMUNITY): Payer: Self-pay

## 2022-09-06 ENCOUNTER — Other Ambulatory Visit: Payer: Self-pay

## 2022-09-06 ENCOUNTER — Ambulatory Visit (HOSPITAL_COMMUNITY)
Admission: RE | Admit: 2022-09-06 | Discharge: 2022-09-06 | Disposition: A | Discharge status: Home / Self Care | Payer: Medicare HMO | Attending: Vascular Surgery | Admitting: Vascular Surgery

## 2022-09-06 ENCOUNTER — Telehealth: Payer: Self-pay | Admitting: Physician Assistant

## 2022-09-06 ENCOUNTER — Encounter (HOSPITAL_COMMUNITY): Payer: Self-pay | Admitting: Vascular Surgery

## 2022-09-06 ENCOUNTER — Encounter (HOSPITAL_COMMUNITY)
Admission: RE | Disposition: A | Discharge status: Home / Self Care | Payer: Self-pay | Source: Home / Self Care | Attending: Vascular Surgery

## 2022-09-06 DIAGNOSIS — T82898A Other specified complication of vascular prosthetic devices, implants and grafts, initial encounter: Secondary | ICD-10-CM | POA: Diagnosis not present

## 2022-09-06 DIAGNOSIS — Z992 Dependence on renal dialysis: Secondary | ICD-10-CM

## 2022-09-06 DIAGNOSIS — Z6834 Body mass index (BMI) 34.0-34.9, adult: Secondary | ICD-10-CM | POA: Insufficient documentation

## 2022-09-06 DIAGNOSIS — J45909 Unspecified asthma, uncomplicated: Secondary | ICD-10-CM | POA: Insufficient documentation

## 2022-09-06 DIAGNOSIS — Z7984 Long term (current) use of oral hypoglycemic drugs: Secondary | ICD-10-CM | POA: Diagnosis not present

## 2022-09-06 DIAGNOSIS — I120 Hypertensive chronic kidney disease with stage 5 chronic kidney disease or end stage renal disease: Secondary | ICD-10-CM

## 2022-09-06 DIAGNOSIS — E1122 Type 2 diabetes mellitus with diabetic chronic kidney disease: Secondary | ICD-10-CM

## 2022-09-06 DIAGNOSIS — N185 Chronic kidney disease, stage 5: Secondary | ICD-10-CM

## 2022-09-06 DIAGNOSIS — N186 End stage renal disease: Secondary | ICD-10-CM

## 2022-09-06 DIAGNOSIS — E669 Obesity, unspecified: Secondary | ICD-10-CM | POA: Insufficient documentation

## 2022-09-06 DIAGNOSIS — G709 Myoneural disorder, unspecified: Secondary | ICD-10-CM | POA: Diagnosis not present

## 2022-09-06 HISTORY — PX: BASCILIC VEIN TRANSPOSITION: SHX5742

## 2022-09-06 HISTORY — DX: Gastro-esophageal reflux disease without esophagitis: K21.9

## 2022-09-06 LAB — GLUCOSE, CAPILLARY
Glucose-Capillary: 96 mg/dL (ref 70–99)
Glucose-Capillary: 93 mg/dL (ref 70–99)

## 2022-09-06 LAB — POCT I-STAT, CHEM 8
BUN: 35 mg/dL — ABNORMAL HIGH (ref 6–20)
Calcium, Ion: 1.19 mmol/L (ref 1.15–1.40)
Chloride: 103 mmol/L (ref 98–111)
Creatinine, Ser: 5.5 mg/dL — ABNORMAL HIGH (ref 0.44–1.00)
Glucose, Bld: 103 mg/dL — ABNORMAL HIGH (ref 70–99)
HCT: 27 % — ABNORMAL LOW (ref 36.0–46.0)
Hemoglobin: 9.2 g/dL — ABNORMAL LOW (ref 12.0–15.0)
Potassium: 4.1 mmol/L (ref 3.5–5.1)
Sodium: 141 mmol/L (ref 135–145)
TCO2: 29 mmol/L (ref 22–32)

## 2022-09-06 SURGERY — TRANSPOSITION, VEIN, BASILIC
Anesthesia: Regional | Site: Arm Upper | Laterality: Right

## 2022-09-06 MED ORDER — HEPARIN 6000 UNIT IRRIGATION SOLUTION
Status: AC
  Filled 2022-09-06: qty 500

## 2022-09-06 MED ORDER — MIDAZOLAM HCL 2 MG/2ML IJ SOLN
INTRAMUSCULAR | Status: AC
  Filled 2022-09-06: qty 2

## 2022-09-06 MED ORDER — VANCOMYCIN HCL IN DEXTROSE 1-5 GM/200ML-% IV SOLN
1000.0000 mg | INTRAVENOUS | Status: AC
  Administered 2022-09-06: 1000 mg via INTRAVENOUS

## 2022-09-06 MED ORDER — HEPARIN SODIUM (PORCINE) 1000 UNIT/ML IJ SOLN
1600.0000 [IU] | Freq: Once | INTRAMUSCULAR | Status: AC
  Administered 2022-09-06: 1600 [IU] via INTRAVENOUS

## 2022-09-06 MED ORDER — 0.9 % SODIUM CHLORIDE (POUR BTL) OPTIME
TOPICAL | Status: DC | PRN
  Administered 2022-09-06: 1000 mL

## 2022-09-06 MED ORDER — CHLORHEXIDINE GLUCONATE 4 % EX SOLN: 60.0000 mL | Freq: Once | CUTANEOUS | Status: DC

## 2022-09-06 MED ORDER — PAPAVERINE HCL 30 MG/ML IJ SOLN
INTRAMUSCULAR | Status: AC
  Filled 2022-09-06: qty 2

## 2022-09-06 MED ORDER — ONDANSETRON HCL 4 MG/2ML IJ SOLN
INTRAMUSCULAR | Status: DC | PRN
  Administered 2022-09-06: 4 mg via INTRAVENOUS

## 2022-09-06 MED ORDER — LIDOCAINE-EPINEPHRINE (PF) 1 %-1:200000 IJ SOLN
INTRAMUSCULAR | Status: AC
  Filled 2022-09-06: qty 30

## 2022-09-06 MED ORDER — PHENYLEPHRINE 80 MCG/ML (10ML) SYRINGE FOR IV PUSH (FOR BLOOD PRESSURE SUPPORT)
PREFILLED_SYRINGE | INTRAVENOUS | Status: AC
  Filled 2022-09-06: qty 10

## 2022-09-06 MED ORDER — OXYCODONE-ACETAMINOPHEN 5-325 MG PO TABS
1.0000 | ORAL_TABLET | Freq: Four times a day (QID) | ORAL | 0 refills | Status: AC | PRN
  Filled 2022-09-06: qty 20, 5d supply, fill #0

## 2022-09-06 MED ORDER — ACETAMINOPHEN 500 MG PO TABS
1000.0000 mg | ORAL_TABLET | Freq: Once | ORAL | Status: AC
  Administered 2022-09-06: 1000 mg via ORAL
  Filled 2022-09-06: qty 2

## 2022-09-06 MED ORDER — SODIUM CHLORIDE 0.9 % IV SOLN: INTRAVENOUS | Status: DC

## 2022-09-06 MED ORDER — ONDANSETRON HCL 4 MG/2ML IJ SOLN
INTRAMUSCULAR | Status: AC
  Filled 2022-09-06: qty 2

## 2022-09-06 MED ORDER — FENTANYL CITRATE (PF) 250 MCG/5ML IJ SOLN
INTRAMUSCULAR | Status: AC
  Filled 2022-09-06: qty 5

## 2022-09-06 MED ORDER — INSULIN ASPART 100 UNIT/ML IJ SOLN: 0.0000 [IU] | INTRAMUSCULAR | Status: DC | PRN

## 2022-09-06 MED ORDER — FENTANYL CITRATE (PF) 100 MCG/2ML IJ SOLN
INTRAMUSCULAR | Status: AC
  Filled 2022-09-06: qty 2

## 2022-09-06 MED ORDER — OXYCODONE HCL 5 MG/5ML PO SOLN: 5.0000 mg | Freq: Once | ORAL | Status: AC | PRN

## 2022-09-06 MED ORDER — OXYCODONE HCL 5 MG PO TABS
ORAL_TABLET | ORAL | Status: AC
  Filled 2022-09-06: qty 1

## 2022-09-06 MED ORDER — CHLORHEXIDINE GLUCONATE 0.12 % MT SOLN
OROMUCOSAL | Status: AC
  Administered 2022-09-06: 15 mL
  Filled 2022-09-06: qty 15

## 2022-09-06 MED ORDER — PROPOFOL 10 MG/ML IV BOLUS
INTRAVENOUS | Status: DC | PRN
  Administered 2022-09-06: 100 mg via INTRAVENOUS

## 2022-09-06 MED ORDER — HEPARIN 6000 UNIT IRRIGATION SOLUTION
Status: DC | PRN
  Administered 2022-09-06: 1

## 2022-09-06 MED ORDER — PROPOFOL 10 MG/ML IV BOLUS
INTRAVENOUS | Status: AC
  Filled 2022-09-06: qty 20

## 2022-09-06 MED ORDER — OXYCODONE HCL 5 MG PO TABS
5.0000 mg | ORAL_TABLET | Freq: Once | ORAL | Status: AC | PRN
  Administered 2022-09-06: 5 mg via ORAL

## 2022-09-06 MED ORDER — FENTANYL CITRATE (PF) 250 MCG/5ML IJ SOLN
INTRAMUSCULAR | Status: DC | PRN
  Administered 2022-09-06: 25 ug via INTRAVENOUS
  Administered 2022-09-06 (×2): 50 ug via INTRAVENOUS
  Administered 2022-09-06 (×2): 25 ug via INTRAVENOUS

## 2022-09-06 MED ORDER — FENTANYL CITRATE (PF) 100 MCG/2ML IJ SOLN
25.0000 ug | INTRAMUSCULAR | Status: DC | PRN
  Administered 2022-09-06: 50 ug via INTRAVENOUS

## 2022-09-06 MED ORDER — PHENYLEPHRINE 80 MCG/ML (10ML) SYRINGE FOR IV PUSH (FOR BLOOD PRESSURE SUPPORT)
PREFILLED_SYRINGE | INTRAVENOUS | Status: DC | PRN
  Administered 2022-09-06 (×3): 80 ug via INTRAVENOUS

## 2022-09-06 MED ORDER — PROMETHAZINE HCL 25 MG/ML IJ SOLN: 6.2500 mg | INTRAMUSCULAR | Status: DC | PRN

## 2022-09-06 MED ORDER — VANCOMYCIN HCL IN DEXTROSE 1-5 GM/200ML-% IV SOLN
INTRAVENOUS | Status: AC
  Filled 2022-09-06: qty 200

## 2022-09-06 SURGICAL SUPPLY — 40 items
ADH SKN CLS APL DERMABOND .7 (GAUZE/BANDAGES/DRESSINGS) ×1
ARMBAND PINK RESTRICT EXTREMIT (MISCELLANEOUS) ×1 IMPLANT
BAG COUNTER SPONGE SURGICOUNT (BAG) ×1 IMPLANT
BAG SPNG CNTER NS LX DISP (BAG) ×1
CANISTER SUCT 3000ML PPV (MISCELLANEOUS) ×1 IMPLANT
CLIP LIGATING EXTRA MED SLVR (CLIP) ×1 IMPLANT
CLIP LIGATING EXTRA SM BLUE (MISCELLANEOUS) ×1 IMPLANT
CLIP TI WIDE RED SMALL 24 (CLIP) IMPLANT
CLIP TI WIDE RED SMALL 6 (CLIP) IMPLANT
COVER PROBE W GEL 5X96 (DRAPES) ×1 IMPLANT
DERMABOND ADVANCED .7 DNX12 (GAUZE/BANDAGES/DRESSINGS) ×1 IMPLANT
ELECT REM PT RETURN 9FT ADLT (ELECTROSURGICAL) ×1
ELECTRODE REM PT RTRN 9FT ADLT (ELECTROSURGICAL) ×1 IMPLANT
GLOVE BIO SURGEON STRL SZ 6 (GLOVE) IMPLANT
GLOVE BIO SURGEON STRL SZ7.5 (GLOVE) ×1 IMPLANT
GLOVE BIOGEL PI IND STRL 6.5 (GLOVE) IMPLANT
GLOVE BIOGEL PI IND STRL 7.5 (GLOVE) IMPLANT
GLOVE ECLIPSE 7.0 STRL STRAW (GLOVE) IMPLANT
GOWN STRL REUS W/ TWL LRG LVL3 (GOWN DISPOSABLE) ×2 IMPLANT
GOWN STRL REUS W/ TWL XL LVL3 (GOWN DISPOSABLE) ×1 IMPLANT
GOWN STRL REUS W/TWL LRG LVL3 (GOWN DISPOSABLE) ×3
GOWN STRL REUS W/TWL XL LVL3 (GOWN DISPOSABLE) ×1
KIT BASIN OR (CUSTOM PROCEDURE TRAY) ×1 IMPLANT
KIT TURNOVER KIT B (KITS) ×1 IMPLANT
NS IRRIG 1000ML POUR BTL (IV SOLUTION) ×1 IMPLANT
PACK CV ACCESS (CUSTOM PROCEDURE TRAY) ×1 IMPLANT
PAD ARMBOARD 7.5X6 YLW CONV (MISCELLANEOUS) ×2 IMPLANT
POWDER SURGICEL 3.0 GRAM (HEMOSTASIS) IMPLANT
SLING ARM FOAM STRAP LRG (SOFTGOODS) IMPLANT
SLING ARM FOAM STRAP MED (SOFTGOODS) IMPLANT
SUT MNCRL AB 4-0 PS2 18 (SUTURE) ×1 IMPLANT
SUT PROLENE 6 0 BV (SUTURE) ×1 IMPLANT
SUT SILK 2 0 SH (SUTURE) IMPLANT
SUT SILK 3 0 (SUTURE) ×1
SUT SILK 3-0 18XBRD TIE 12 (SUTURE) IMPLANT
SUT VIC AB 3-0 SH 27 (SUTURE) ×2
SUT VIC AB 3-0 SH 27X BRD (SUTURE) ×1 IMPLANT
TOWEL GREEN STERILE (TOWEL DISPOSABLE) ×1 IMPLANT
UNDERPAD 30X36 HEAVY ABSORB (UNDERPADS AND DIAPERS) ×1 IMPLANT
WATER STERILE IRR 1000ML POUR (IV SOLUTION) ×1 IMPLANT

## 2022-09-06 NOTE — Discharge Instructions (Signed)
° °  [facility] ° °Discharge Instructions ° °AV Fistula or Graft Surgery for Dialysis Access ° °Please refer to the following instructions for your post-procedure care. Your surgeon or physician assistant will discuss any changes with you. ° °Activity ° °You may drive the day following your surgery, if you are comfortable and no longer taking prescription pain medication. Resume full activity as the soreness in your incision resolves. ° °Bathing/Showering ° °You may shower after you go home. Keep your incision dry for 48 hours. Do not soak in a bathtub, hot tub, or swim until the incision heals completely. You may not shower if you have a hemodialysis catheter. ° °Incision Care ° °Clean your incision with mild soap and water after 48 hours. Pat the area dry with a clean towel. You do not need a bandage unless otherwise instructed. Do not apply any ointments or creams to your incision. You may have skin glue on your incision. Do not peel it off. It will come off on its own in about one week. Your arm may swell a bit after surgery. To reduce swelling use pillows to elevate your arm so it is above your heart. Your doctor will tell you if you need to lightly wrap your arm with an ACE bandage. ° °Diet ° °Resume your normal diet. There are not special food restrictions following this procedure. In order to heal from your surgery, it is CRITICAL to get adequate nutrition. Your body requires vitamins, minerals, and protein. Vegetables are the best source of vitamins and minerals. Vegetables also provide the perfect balance of protein. Processed food has little nutritional value, so try to avoid this. ° °Medications ° °Resume taking all of your medications. If your incision is causing pain, you may take over-the counter pain relievers such as acetaminophen (Tylenol). If you were prescribed a stronger pain medication, please be aware these medications can cause nausea and constipation. Prevent  nausea by taking the medication with a snack or meal. Avoid constipation by drinking plenty of fluids and eating foods with high amount of fiber, such as fruits, vegetables, and grains. Do not take Tylenol if you are taking prescription pain medications. ° ° ° ° °Follow up °Your surgeon may want to see you in the office following your access surgery. If so, this will be arranged at the time of your surgery. ° °Please call us immediately for any of the following conditions: ° °Increased pain, redness, drainage (pus) from your incision site °Fever of 101 degrees or higher °Severe or worsening pain at your incision site °Hand pain or numbness. ° °Reduce your risk of vascular disease: ° °Stop smoking. If you would like help, call [facility] at 1-800-QUIT-NOW (1-800-784-8669) or [place] at 336-586-4000 ° °Manage your cholesterol °Maintain a desired weight °Control your diabetes °Keep your blood pressure down ° °Dialysis ° °It will take several weeks to several months for your new dialysis access to be ready for use. Your surgeon will determine when it is OK to use it. Your nephrologist will continue to direct your dialysis. You can continue to use your Permcath until your new access is ready for use. ° °If you have any questions, please call the office at [phone]. ° °

## 2022-09-06 NOTE — Interval H&P Note (Signed)
History and Physical Interval Note:  09/06/2022 7:22 AM  [name]  has presented today for surgery, with the diagnosis of ESRD.  The various methods of treatment have been discussed with the patient and family. After consideration of risks, benefits and other options for treatment, the patient has consented to  Procedure(s): RIGHT ARM SECOND STAGE BASILIC VEIN TRANSPOSITION VERSUS ARTERIOVENOUS GRAFT CREATION (Right) as a surgical intervention.  The patient's history has been reviewed, patient examined, no change in status, stable for surgery.  I have reviewed the patient's chart and labs.  Questions were answered to the patient's satisfaction.     Lemar Livings

## 2022-09-06 NOTE — Anesthesia Postprocedure Evaluation (Signed)
Anesthesia Post Note  Patient: [name]  Procedure(s) Performed: RIGHT ARM SECOND STAGE BASILIC VEIN TRANSPOSITION. (Right: Arm Upper)     Patient location during evaluation: PACU Anesthesia Type: General Level of consciousness: awake and alert Pain management: pain level controlled Vital Signs Assessment: post-procedure vital signs reviewed and stable Respiratory status: spontaneous breathing, nonlabored ventilation and respiratory function stable Cardiovascular status: stable and blood pressure returned to baseline Anesthetic complications: no   No notable events documented.  Last Vitals:  Vitals:   09/06/22 1015 09/06/22 1030  BP: (!) 160/66 (!) 159/74  Pulse: 75 74  Resp: 11 12  Temp:  37.1 C  SpO2: 100% 99%    Last Pain:  Vitals:   09/06/22 0619  TempSrc: Oral  PainSc: 0-No pain                 Beryle Lathe

## 2022-09-06 NOTE — Op Note (Signed)
    Patient name: [name] MRN: [id number] DOB: March 30, 1965 Sex: female  09/06/2022 Pre-operative Diagnosis: ESRD Post-operative diagnosis:  Same Surgeon:  Apolinar Junes C. Randie Heinz, MD Assistant: Clinton Gallant, PA Procedure Performed:  Revision right arm basilic vein fistula with transposition  Indications: 57 year old female with history of end-stage renal disease currently dialyzing catheter.  She has a basilic vein fistula on the right and is left-hand dominant.  She is indicated for revision with transposition.  Findings: Fistula thrill was very strong.  The fistula connected to the deep vein system in the mid upper arm which was repaired brachial system.  We harvested vein branches in order to resect the fistula entirely 3 and a completion had minimal diameter approximately 5 mm with very strong thrill palpable radial artery pulse at the wrist.   Procedure:  The patient was identified in the holding area and taken to the operating where she was placed supine on the operating table and LMA anesthesia was induced.  Previous incision above the antecubital was open we dissected into the fistula.  Several branches of the nerve were divided between clips.  We then made 2 separate incisions to the axilla and dissected the vein entirely free.  In the mid segment of the upper arm fistula connected to the deep system there were multiple branches there and in the axilla.  Ultimately patient was dissected free we marked for orientation.  We transected near the antecubitum and flushed with heparinized saline tunnel bilaterally spatulated both ends and sewn in end-to-end with 6-0 Prolene suture.  Prior to completion without flushing or reactions.  Clinically she has a very strong thrill in the fistula and a palpable radial artery pulse at the wrist.  Unfortunately there was a cautery injury to the forearm not operating.  Cautery injury was excised and closed with a running 4-0 Monocryl suture and Dermabond is placed  to the skin level.  The incisions in the upper arm were irrigated and closed in layers with Vicryl Monocryl and again Dermabond was used.  Patient awakened from anesthesia having tolerated procedure without immediate complication.  All counts were correct at completion.  EBL: 100cc   Elycia Woodside C. Randie Heinz, MD [facility] [place] Office: [phone] Pager: [phone]

## 2022-09-06 NOTE — Telephone Encounter (Signed)
-----   Message from Mosetta Pigeon sent at 09/06/2022  9:37 AM EDT ----- F/U in 2-3 weeks for incision check on [facility] on a Weds. Dr. Darcella Cheshire day

## 2022-09-06 NOTE — Transfer of Care (Signed)
Immediate Anesthesia Transfer of Care Note  Patient: [name]  Procedure(s) Performed: RIGHT ARM SECOND STAGE BASILIC VEIN TRANSPOSITION. (Right: Arm Upper)  Patient Location: PACU  Anesthesia Type:General  Level of Consciousness: awake, alert , and oriented  Airway & Oxygen Therapy: Patient Spontanous Breathing  Post-op Assessment: Report given to RN and Post -op Vital signs reviewed and stable  Post vital signs: Reviewed and stable  Last Vitals:  Vitals Value Taken Time  BP 149/70 09/06/22 0945  Temp    Pulse 74 09/06/22 0945  Resp    SpO2 100 % 09/06/22 0945  Vitals shown include unfiled device data.  Last Pain:  Vitals:   09/06/22 0619  TempSrc: Oral  PainSc: 0-No pain         Complications: No notable events documented.

## 2022-09-07 ENCOUNTER — Encounter (HOSPITAL_COMMUNITY): Payer: Self-pay | Admitting: Vascular Surgery

## 2022-09-10 ENCOUNTER — Other Ambulatory Visit: Payer: Self-pay | Admitting: Family Medicine

## 2022-09-25 IMAGING — CR DG CHEST 2V
2 series · 2 of 2 positions shown · non-contrast
Comparison: 03/08/2014

CLINICAL DATA: Shortness of breath

EXAM:
CHEST - 2 VIEW

[chest lat]
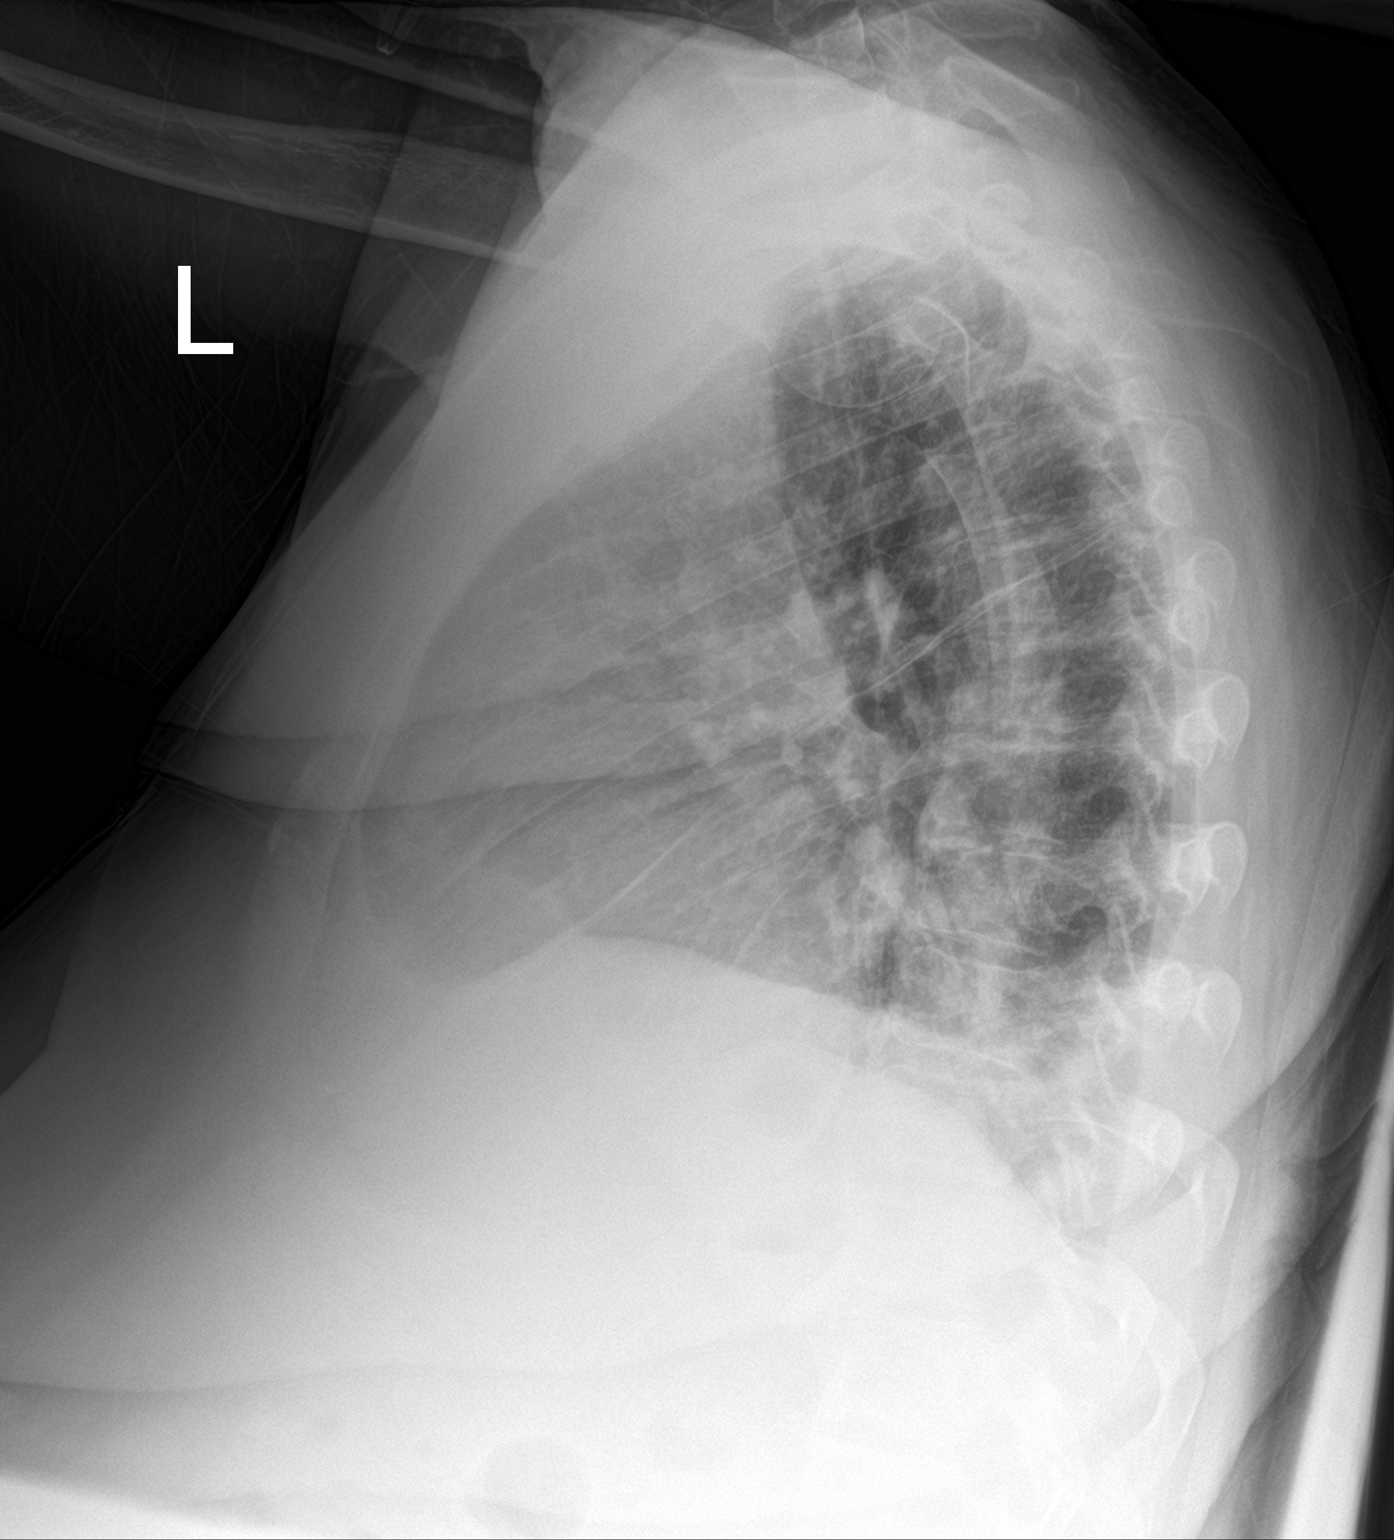

[chest ap]
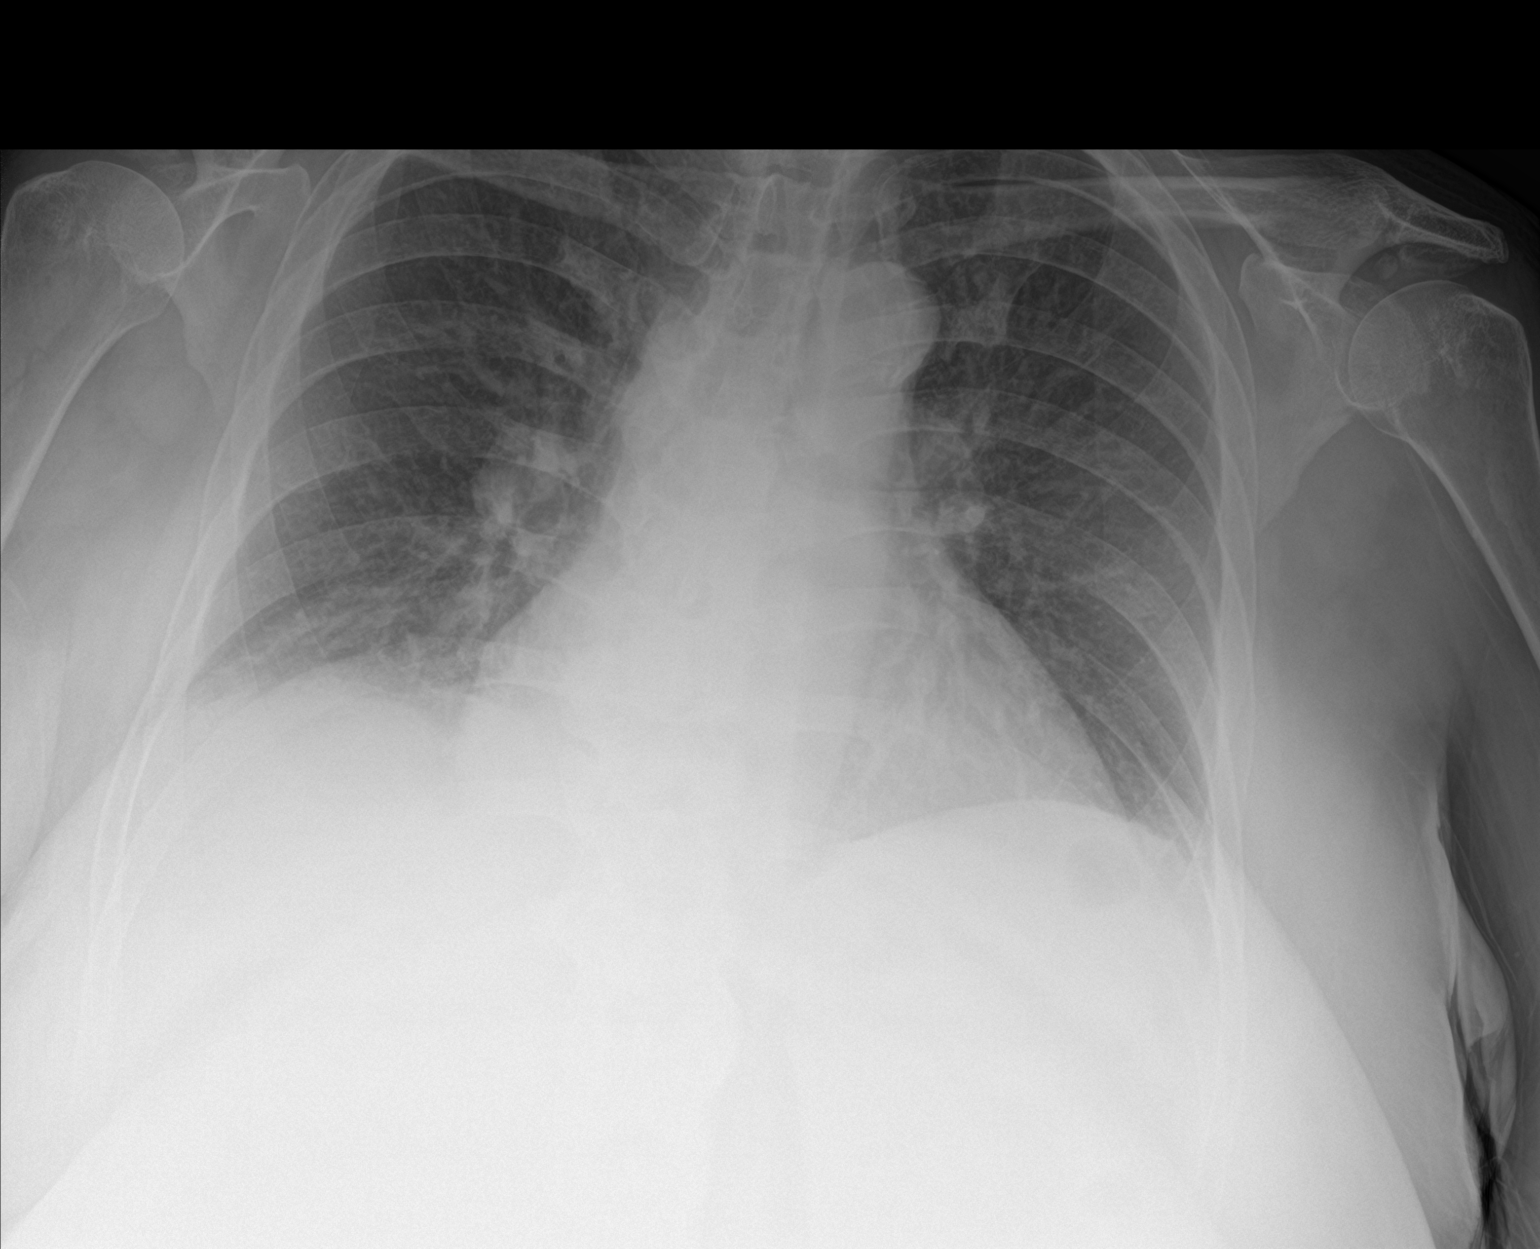

[2 of 2 positions shown; findings below may reference images not displayed]

FINDINGS: Transverse diameter of heart is increased. There are no signs of
pulmonary edema. There is poor inspiration. Linear densities seen in
the right lower lung fields. There is no significant pleural
effusion or pneumothorax.
IMPRESSION: Linear densities in the right lower lung fields may suggest crowding
of bronchovascular structures due to poor inspiration or
subsegmental atelectasis/pneumonia. There are no signs of alveolar
pulmonary edema or focal pulmonary consolidation.

## 2022-10-07 NOTE — Progress Notes (Unsigned)
  POST OPERATIVE OFFICE NOTE    CC:  F/u for surgery  HPI:  This is a 57 y.o. female who is s/p revision right arm BVT (2nd stage) on 09/06/2022 by Dr. Randie Heinz.   She had 1st stage BVT on 05/03/2022 by Dr. Randie Heinz.   Pt states she does *** have pain/numbness in the *** hand.    The pt *** on dialysis *** at *** location.   Allergies  Allergen Reactions   Penicillins Rash    Current Outpatient Medications  Medication Sig Dispense Refill   albuterol (VENTOLIN HFA) 108 (90 Base) MCG/ACT inhaler Inhale 1-2 puffs into the lungs every 6 (six) hours as needed for wheezing or shortness of breath. (Patient taking differently: Inhale 1-2 puffs into the lungs daily as needed for wheezing or shortness of breath.) 18 g 0   amLODipine (NORVASC) 5 MG tablet Take 1 tablet (5 mg total) by mouth daily. 30 tablet 1   Blood Glucose Monitoring Suppl (TRUE METRIX METER) w/Device KIT Use to check blood sugar 3 times daily 1 kit 0   ceFEPIme 1 g in sodium chloride 0.9 % 100 mL Inject 1 g into the vein daily. With Hemodialysis until 11/20. 1 ampule 0   fluticasone-salmeterol (ADVAIR) 500-50 MCG/ACT AEPB Inhale 1 puff into the lungs in the morning and at bedtime. 1 each 2   furosemide (LASIX) 20 MG tablet Take 20 mg by mouth daily.     glucose blood (TRUE METRIX BLOOD GLUCOSE TEST) test strip Use to check blood sugar three times per day 100 each 12   metFORMIN (GLUCOPHAGE) 500 MG tablet Take 500 mg by mouth daily. Dinner     oxyCODONE-acetaminophen (PERCOCET/ROXICET) 5-325 MG tablet Take 1 tablet by mouth every 6 (six) hours as needed. 20 tablet 0   sevelamer carbonate (RENVELA) 800 MG tablet Take 1 tablet (800 mg total) by mouth 3 (three) times daily with meals. (Patient taking differently: Take 1,600 mg by mouth 3 (three) times daily with meals.) 30 tablet 0   TRUEPLUS LANCETS 28G MISC Use to check blood sugar 3 times daily 100 each 11   No current facility-administered medications for this visit.     ROS:  See  HPI  Physical Exam:  ***  Incision:  *** Extremities:   There *** a palpable *** pulse.   Motor and sensory *** in tact.   There *** a thrill/bruit present.  Access is *** easily palpable    Assessment/Plan:  This is a 57 y.o. female who is s/p: revision right arm BVT (2nd stage) on 09/06/2022 by Dr. Randie Heinz.    -the pt does *** have evidence of steal. -pt's access can be used ***. -if pt has tunneled catheter, this can be removed at the discretion of the dialysis center once the pt's access has been successfully cannulated to their satisfaction.  -discussed with pt that access does not last forever and will need intervention or even new access at some point.  -the pt will follow up ***   Doreatha Massed, [facility] [phone]  Clinic MD:  Randie Heinz

## 2022-10-09 ENCOUNTER — Encounter: Payer: Self-pay | Admitting: Physician Assistant

## 2022-10-09 ENCOUNTER — Ambulatory Visit (INDEPENDENT_AMBULATORY_CARE_PROVIDER_SITE_OTHER): Payer: Medicare HMO | Admitting: Physician Assistant

## 2022-10-09 ENCOUNTER — Encounter: Payer: Self-pay | Admitting: Family Medicine

## 2022-10-09 ENCOUNTER — Ambulatory Visit (INDEPENDENT_AMBULATORY_CARE_PROVIDER_SITE_OTHER): Payer: Medicare HMO | Admitting: Family Medicine

## 2022-10-09 VITALS — BP 139/84 | HR 81 | Temp 98.1°F | Resp 16 | Ht 66.0 in | Wt 202.0 lb

## 2022-10-09 VITALS — BP 133/72 | HR 84 | Temp 98.3°F | Resp 18 | Ht 66.0 in | Wt 201.5 lb

## 2022-10-09 DIAGNOSIS — I10 Essential (primary) hypertension: Secondary | ICD-10-CM

## 2022-10-09 DIAGNOSIS — Z7984 Long term (current) use of oral hypoglycemic drugs: Secondary | ICD-10-CM

## 2022-10-09 DIAGNOSIS — E1122 Type 2 diabetes mellitus with diabetic chronic kidney disease: Secondary | ICD-10-CM | POA: Diagnosis not present

## 2022-10-09 DIAGNOSIS — N186 End stage renal disease: Secondary | ICD-10-CM

## 2022-10-09 DIAGNOSIS — N184 Chronic kidney disease, stage 4 (severe): Secondary | ICD-10-CM | POA: Diagnosis not present

## 2022-10-09 LAB — POCT GLYCOSYLATED HEMOGLOBIN (HGB A1C): Hemoglobin A1C: 5.5 % (ref 4.0–5.6)

## 2022-10-09 MED ORDER — METFORMIN HCL ER 500 MG PO TB24: 500.0000 mg | ORAL_TABLET | Freq: Every day | ORAL | 1 refills | Status: AC

## 2022-10-09 NOTE — Progress Notes (Signed)
Patient is here for their 6 month follow-up Patient has no concerns today Care gaps have been discussed with patient  

## 2022-10-11 ENCOUNTER — Encounter: Payer: Self-pay | Admitting: Family Medicine

## 2022-10-11 NOTE — Progress Notes (Signed)
Established Patient Office Visit  Subjective    Patient ID: [name], female    DOB: 16-Apr-1965  Age: 57 y.o. MRN: [id number]  CC: No chief complaint on file.   HPI [name] presents for follow up of chronic med issues. Patient denies acute complaints or concerns.    Outpatient Encounter Medications as of 10/09/2022  Medication Sig   albuterol (VENTOLIN HFA) 108 (90 Base) MCG/ACT inhaler Inhale 1-2 puffs into the lungs every 6 (six) hours as needed for wheezing or shortness of breath. (Patient taking differently: Inhale 1-2 puffs into the lungs daily as needed for wheezing or shortness of breath.)   amLODipine (NORVASC) 5 MG tablet Take 1 tablet (5 mg total) by mouth daily.   Blood Glucose Monitoring Suppl (TRUE METRIX METER) w/Device KIT Use to check blood sugar 3 times daily   ceFEPIme 1 g in sodium chloride 0.9 % 100 mL Inject 1 g into the vein daily. With Hemodialysis until 11/20.   fluticasone-salmeterol (ADVAIR) 500-50 MCG/ACT AEPB Inhale 1 puff into the lungs in the morning and at bedtime.   furosemide (LASIX) 20 MG tablet Take 20 mg by mouth daily.   glucose blood (TRUE METRIX BLOOD GLUCOSE TEST) test strip Use to check blood sugar three times per day   metFORMIN (GLUCOPHAGE) 500 MG tablet Take 500 mg by mouth daily. Dinner   metFORMIN (GLUCOPHAGE-XR) 500 MG 24 hr tablet Take 1 tablet (500 mg total) by mouth daily with breakfast.   Methoxy PEG-Epoetin Beta (MIRCERA IJ) Mircera   Methoxy PEG-Epoetin Beta (MIRCERA IJ) Mircera   Methoxy PEG-Epoetin Beta (MIRCERA IJ) Mircera   oxyCODONE-acetaminophen (PERCOCET/ROXICET) 5-325 MG tablet Take 1 tablet by mouth every 6 (six) hours as needed.   sevelamer carbonate (RENVELA) 800 MG tablet Take 1 tablet (800 mg total) by mouth 3 (three) times daily with meals. (Patient taking differently: Take 1,600 mg by mouth 3 (three) times daily with meals.)   TRUEPLUS LANCETS 28G MISC Use to check blood sugar 3 times daily   No  facility-administered encounter medications on file as of 10/09/2022.    Past Medical History:  Diagnosis Date   Asthma    Child   Chronic kidney disease    Diabetes mellitus    GERD (gastroesophageal reflux disease)    Obesity     Past Surgical History:  Procedure Laterality Date   AV FISTULA PLACEMENT Right 05/03/2022   Procedure: RIGHT ARM  BRACHIOBASILIC ARTERIOVENOUS (AV) FISTULA;  Surgeon: Maeola Harman, MD;  Location: [facility] OR;  Service: Vascular;  Laterality: Right;   BASCILIC VEIN TRANSPOSITION Right 09/06/2022   Procedure: RIGHT ARM SECOND STAGE BASILIC VEIN TRANSPOSITION.;  Surgeon: Maeola Harman, MD;  Location: [facility] OR;  Service: Vascular;  Laterality: Right;   CHOLECYSTECTOMY     INSERTION OF DIALYSIS CATHETER Right 01/04/2022   Procedure: INSERTION OF TUNNELED DIALYSIS CATHETER;  Surgeon: Chuck Hint, MD;  Location: [facility] OR;  Service: Vascular;  Laterality: Right;   TUBAL LIGATION      Family History  Problem Relation Age of Onset   Diabetes Mother     Social History   Socioeconomic History   Marital status: Divorced    Spouse name: Not on file   Number of children: 3   Years of education: Not on file   Highest education level: Not on file  Occupational History   Not on file  Tobacco Use   Smoking status: Never   Smokeless tobacco: Never   Tobacco comments:  Smoked when she was a teenager.   Vaping Use   Vaping status: Never Used  Substance and Sexual Activity   Alcohol use: Not Currently   Drug use: No   Sexual activity: Yes  Other Topics Concern   Not on file  Social History Narrative   Lives with 3 children in [place]. Does not work    Chemical engineer Strain: Not on file  Food Insecurity: Food Insecurity Present (01/02/2022)   Hunger Vital Sign    Worried About Running Out of Food in the Last Year: Sometimes true    Ran Out of Food in the Last Year: Sometimes true   Transportation Needs: No Transportation Needs (01/02/2022)   PRAPARE - Administrator, Civil Service (Medical): No    Lack of Transportation (Non-Medical): No  Physical Activity: Not on file  Stress: Not on file  Social Connections: Not on file  Intimate Partner Violence: Not At Risk (01/02/2022)   Humiliation, Afraid, Rape, and Kick questionnaire    Fear of Current or Ex-Partner: No    Emotionally Abused: No    Physically Abused: No    Sexually Abused: No    Review of Systems  All other systems reviewed and are negative.       Objective    BP 139/84   Pulse 81   Temp 98.1 F (36.7 C) (Oral)   Resp 16   Ht 5\' 6"  (1.676 m)   Wt 202 lb (91.6 kg)   LMP 05/26/2017   SpO2 96%   BMI 32.60 kg/m   Physical Exam Vitals and nursing note reviewed.  Constitutional:      General: She is not in acute distress. Cardiovascular:     Rate and Rhythm: Normal rate and regular rhythm.  Pulmonary:     Effort: Pulmonary effort is normal.     Breath sounds: Normal breath sounds. No wheezing.  Abdominal:     Palpations: Abdomen is soft.     Tenderness: There is no abdominal tenderness.  Musculoskeletal:     Right lower leg: No edema.     Left lower leg: No edema.  Neurological:     General: No focal deficit present.     Mental Status: She is alert and oriented to person, place, and time.         Assessment & Plan:   1. Type 2 diabetes mellitus with stage 4 chronic kidney disease, without long-term current use of insulin (HCC) A1c at goal. Continue  - POCT glycosylated hemoglobin (Hb A1C) - HM DIABETES FOOT EXAM  2. Essential hypertension Appears stable. Continue    No follow-ups on file.   Tommie Raymond, MD

## 2023-01-10 ENCOUNTER — Ambulatory Visit: Payer: Medicare HMO | Admitting: Family Medicine

## 2023-01-13 ENCOUNTER — Ambulatory Visit: Payer: Medicare HMO | Admitting: Family Medicine

## 2023-06-19 ENCOUNTER — Ambulatory Visit: Payer: Medicare HMO

## 2023-12-19 ENCOUNTER — Encounter (HOSPITAL_COMMUNITY): Payer: Self-pay | Admitting: Surgery

## 2023-12-19 ENCOUNTER — Other Ambulatory Visit: Payer: Self-pay

## 2023-12-19 ENCOUNTER — Encounter (HOSPITAL_COMMUNITY)
Admission: RE | Disposition: A | Discharge status: Home / Self Care | Payer: Self-pay | Source: Home / Self Care | Attending: Surgery

## 2023-12-19 ENCOUNTER — Ambulatory Visit (HOSPITAL_COMMUNITY)
Admission: RE | Admit: 2023-12-19 | Discharge: 2023-12-19 | Disposition: A | Discharge status: Home / Self Care | Attending: Surgery | Admitting: Surgery

## 2023-12-19 DIAGNOSIS — T82858A Stenosis of vascular prosthetic devices, implants and grafts, initial encounter: Secondary | ICD-10-CM | POA: Insufficient documentation

## 2023-12-19 DIAGNOSIS — Y832 Surgical operation with anastomosis, bypass or graft as the cause of abnormal reaction of the patient, or of later complication, without mention of misadventure at the time of the procedure: Secondary | ICD-10-CM | POA: Insufficient documentation

## 2023-12-19 DIAGNOSIS — E1122 Type 2 diabetes mellitus with diabetic chronic kidney disease: Secondary | ICD-10-CM | POA: Insufficient documentation

## 2023-12-19 DIAGNOSIS — Z992 Dependence on renal dialysis: Secondary | ICD-10-CM | POA: Diagnosis not present

## 2023-12-19 DIAGNOSIS — N186 End stage renal disease: Secondary | ICD-10-CM

## 2023-12-19 HISTORY — PX: A/V FISTULAGRAM: CATH118298

## 2023-12-19 HISTORY — PX: VENOUS ANGIOPLASTY: CATH118376

## 2023-12-19 LAB — GLUCOSE, CAPILLARY: Glucose-Capillary: 117 mg/dL — ABNORMAL HIGH (ref 70–99)

## 2023-12-19 SURGERY — A/V FISTULAGRAM
Anesthesia: LOCAL | Site: Arm Upper | Laterality: Right

## 2023-12-19 MED ORDER — IODIXANOL 320 MG/ML IV SOLN
INTRAVENOUS | Status: DC | PRN
  Administered 2023-12-19: 50 mL via INTRAVENOUS

## 2023-12-19 MED ORDER — HEPARIN (PORCINE) IN NACL 1000-0.9 UT/500ML-% IV SOLN
INTRAVENOUS | Status: DC | PRN
  Administered 2023-12-19: 500 mL

## 2023-12-19 MED ORDER — LIDOCAINE HCL (PF) 1 % IJ SOLN
INTRAMUSCULAR | Status: DC | PRN
  Administered 2023-12-19: 5 mL

## 2023-12-19 SURGICAL SUPPLY — 9 items
BALLOON MUSTANG 7X60X75 (BALLOONS) IMPLANT
BALLOON MUSTANG 8X100X75 (BALLOONS) IMPLANT
DEVICE INFLATION ENCORE 26 (MISCELLANEOUS) IMPLANT
GLIDEWIRE ADV .035X180CM (WIRE) IMPLANT
KIT MICROPUNCTURE NIT STIFF (SHEATH) IMPLANT
SHEATH PINNACLE R/O II 6F 4CM (SHEATH) IMPLANT
STOPCOCK MORSE 400PSI 3WAY (MISCELLANEOUS) IMPLANT
TRAY PV CATH (CUSTOM PROCEDURE TRAY) ×2 IMPLANT
TUBING CIL FLEX 10 FLL-RA (TUBING) IMPLANT

## 2023-12-19 NOTE — Op Note (Signed)
    Patient name: [name] MRN: [id number] DOB: 09-Aug-1965 Sex: female  12/19/2023 Pre-operative Diagnosis: ESRD Post-operative diagnosis:  Same Surgeon:  Malvina New Procedure Performed:  1.  Ultrasound-guided access, right basilic vein  2.  Fistulogram  3.  Venoplasty, right basilic vein    Indications: This is a 58 year old female who is having trouble with her flow rates.  She is here for further evaluation and possible intervention.  Procedure:  The patient was identified in the holding area and taken to room 8.  The patient was then placed supine on the table and prepped and draped in the usual sterile fashion.  A time out was called.  Ultrasound was used to evaluate the fistula.  The vein was patent and compressible.  A digital ultrasound image was acquired.  The fistula was then accessed under ultrasound guidance using a micropuncture needle.  An 018 wire was then asvanced without resistance and a micropuncture sheath was placed.  Contrast injections were then performed through the sheath.  Findings: The central venous system is widely patent.  There is no central stenosis.  The proximal and midportion of the fistula has several areas of luminal narrowing approaching 80%.  The vein is small in caliber near the arterial anastomosis.   Intervention: After the above images were acquired the decision was made to proceed with intervention.  Over a Glidewire advantage a 6 [place] sheath was placed.  I first selected a 7 x 80 Mustang balloon and performed balloon venoplasty of the areas of stenosis.  Completion imaging showed persistent stenosis and so I upsized to a 8 x 100 balloon.  I treated the majority of the proximal fistula from the cannulation site distally.  The sheath did come out temporarily and so she did have a small hematoma.  There were 2 areas that were fairly resistant to balloon venoplasty however at completion, residual stenosis was less than 20% and so I elected to stop  at this time.  Wires were removed and the sheath was taken out over a Monocryl suture.  Impression:  #1  Widely patent central venous system  #2  The proximal half of the fistula is small in caliber.  There were several areas of focal stenosis greater than 80%.  These were treated with balloon venoplasty using up to an 8 mm balloon.  There is still stenosis of the approximately 15% after intervention, however with the thrill within the was significantly improved  #3  Fistula remains amenable to future interventions.  I would recommend cannulation up near the shoulder going down towards the arteriovenous anastomosis in the future     V. Malvina New, M.D., [facility] [place] Office: [phone] Pager:  [phone]

## 2023-12-19 NOTE — H&P (Signed)
 [facility]  Patient name: [name] MRN: [id number] DOB: 02-02-1966 Sex: female   REASON FOR VISIT:    ESRD  HISOTRY OF PRESENT ILLNESS:    [name] is a 58 y.o. female who is status post basilic vein fistula on 09/06/2022.  She is having trouble with flow rates.  She has not had any prior fistulogram's.  She denies any arm swelling or trouble with bleeding.   PAST MEDICAL HISTORY:   Past Medical History:  Diagnosis Date   Asthma    Child   Chronic kidney disease    Diabetes mellitus    GERD (gastroesophageal reflux disease)    Obesity      FAMILY HISTORY:   Family History  Problem Relation Age of Onset   Diabetes Mother     SOCIAL HISTORY:   Social History   Tobacco Use   Smoking status: Never   Smokeless tobacco: Never   Tobacco comments:    Smoked when she was a teenager.   Substance Use Topics   Alcohol use: Not Currently     ALLERGIES:   Allergies  Allergen Reactions   Penicillins Rash     CURRENT MEDICATIONS:   No current facility-administered medications for this encounter.    REVIEW OF SYSTEMS:   [X]  denotes positive finding, [ ]  denotes negative finding Cardiac  Comments:  Chest pain or chest pressure:    Shortness of breath upon exertion:    Short of breath when lying flat:    Irregular heart rhythm:        Vascular    Pain in calf, thigh, or hip brought on by ambulation:    Pain in feet at night that wakes you up from your sleep:     Blood clot in your veins:    Leg swelling:         Pulmonary    Oxygen at home:    Productive cough:     Wheezing:         Neurologic    Sudden weakness in arms or legs:     Sudden numbness in arms or legs:     Sudden onset of difficulty speaking or slurred speech:    Temporary loss of vision in one eye:     Problems with dizziness:         Gastrointestinal    Blood in stool:     Vomited blood:          Genitourinary    Burning when urinating:     Blood in urine:        Psychiatric    Major depression:         Hematologic    Bleeding problems:    Problems with blood clotting too easily:        Skin    Rashes or ulcers:        Constitutional    Fever or chills:      PHYSICAL EXAM:   Vitals:   12/19/23 0802  BP: (!) (P) 155/79  Pulse: (P) 91  Resp: (P) 12  Temp: (P) 98.1 F (36.7 C)  TempSrc: (P) Oral    GENERAL: The patient is a well-nourished female, in no acute distress. The vital signs are documented above. CARDIAC: There is a regular rate and rhythm.  VASCULAR: Palpable thrill within basilic vein fistula PULMONARY: Non-labored respirations ABDOMEN: Soft and non-tender with normal pitched bowel sounds.  MUSCULOSKELETAL: There are no major deformities or  cyanosis. NEUROLOGIC: No focal weakness or paresthesias are detected. SKIN: There are no ulcers or rashes noted. PSYCHIATRIC: The patient has a normal affect.  STUDIES:     MEDICAL ISSUES:   ESRD: I discussed that I feel that a fistulogram is recommended to see why she is having difficulty with dialysis.  I discussed the details of the procedure and possible interventions.  All questions were answered and she wishes to proceed.    Malvina Serene CLORE, MD, FACS [facility] [phone] Pager [phone]

## 2024-05-20 ENCOUNTER — Ambulatory Visit: Payer: Self-pay
